# Patient Record
Sex: Female | Born: 1955 | Race: White | Hispanic: No | Smoking: Former smoker
Health system: Southern US, Community
[De-identification: ages and names within clinical notes are randomized; demographics above are authoritative.]

## PROBLEM LIST (undated history)

## (undated) DIAGNOSIS — K5792 Diverticulitis of intestine, part unspecified, without perforation or abscess without bleeding: Secondary | ICD-10-CM

## (undated) DIAGNOSIS — M1990 Unspecified osteoarthritis, unspecified site: Secondary | ICD-10-CM

## (undated) DIAGNOSIS — N879 Dysplasia of cervix uteri, unspecified: Secondary | ICD-10-CM

## (undated) DIAGNOSIS — R42 Dizziness and giddiness: Secondary | ICD-10-CM

## (undated) DIAGNOSIS — T7840XA Allergy, unspecified, initial encounter: Secondary | ICD-10-CM

## (undated) DIAGNOSIS — K635 Polyp of colon: Secondary | ICD-10-CM

## (undated) DIAGNOSIS — F419 Anxiety disorder, unspecified: Secondary | ICD-10-CM

## (undated) DIAGNOSIS — G4730 Sleep apnea, unspecified: Secondary | ICD-10-CM

## (undated) DIAGNOSIS — G43909 Migraine, unspecified, not intractable, without status migrainosus: Secondary | ICD-10-CM

## (undated) DIAGNOSIS — T8859XA Other complications of anesthesia, initial encounter: Secondary | ICD-10-CM

## (undated) DIAGNOSIS — F32A Depression, unspecified: Secondary | ICD-10-CM

## (undated) DIAGNOSIS — C801 Malignant (primary) neoplasm, unspecified: Secondary | ICD-10-CM

## (undated) HISTORY — DX: Sleep apnea, unspecified: G47.30

## (undated) HISTORY — DX: Diverticulitis of intestine, part unspecified, without perforation or abscess without bleeding: K57.92

## (undated) HISTORY — PX: COLPOSCOPY: SHX161

## (undated) HISTORY — PX: BREAST SURGERY: SHX581

## (undated) HISTORY — DX: Polyp of colon: K63.5

## (undated) HISTORY — DX: Unspecified osteoarthritis, unspecified site: M19.90

## (undated) HISTORY — DX: Dizziness and giddiness: R42

## (undated) HISTORY — DX: Dysplasia of cervix uteri, unspecified: N87.9

## (undated) HISTORY — PX: CHOLECYSTECTOMY: SHX55

## (undated) HISTORY — PX: ECTOPIC PREGNANCY SURGERY: SHX613

## (undated) HISTORY — DX: Allergy, unspecified, initial encounter: T78.40XA

## (undated) HISTORY — PX: EXTERNAL EAR SURGERY: SHX627

## (undated) HISTORY — DX: Migraine, unspecified, not intractable, without status migrainosus: G43.909

## (undated) HISTORY — PX: GYNECOLOGIC CRYOSURGERY: SHX857

## (undated) HISTORY — DX: Anxiety disorder, unspecified: F41.9

## (undated) HISTORY — PX: EYE SURGERY: SHX253

---

## 1999-11-19 ENCOUNTER — Emergency Department (HOSPITAL_COMMUNITY): Admission: EM | Admit: 1999-11-19 | Discharge: 1999-11-19 | Payer: Self-pay

## 2000-08-29 ENCOUNTER — Other Ambulatory Visit: Admission: RE | Admit: 2000-08-29 | Discharge: 2000-08-29 | Payer: Self-pay | Admitting: Obstetrics and Gynecology

## 2001-02-25 ENCOUNTER — Ambulatory Visit: Admission: RE | Admit: 2001-02-25 | Discharge: 2001-02-25 | Payer: Self-pay | Admitting: Pulmonary Disease

## 2001-04-10 ENCOUNTER — Ambulatory Visit: Admission: RE | Admit: 2001-04-10 | Discharge: 2001-04-10 | Payer: Self-pay | Admitting: Pulmonary Disease

## 2001-06-04 ENCOUNTER — Ambulatory Visit (HOSPITAL_BASED_OUTPATIENT_CLINIC_OR_DEPARTMENT_OTHER): Admission: RE | Admit: 2001-06-04 | Discharge: 2001-06-04 | Payer: Self-pay | Admitting: Pulmonary Disease

## 2001-09-10 ENCOUNTER — Other Ambulatory Visit: Admission: RE | Admit: 2001-09-10 | Discharge: 2001-09-10 | Payer: Self-pay | Admitting: Obstetrics and Gynecology

## 2002-09-28 ENCOUNTER — Other Ambulatory Visit: Admission: RE | Admit: 2002-09-28 | Discharge: 2002-09-28 | Payer: Self-pay | Admitting: Obstetrics and Gynecology

## 2003-03-01 ENCOUNTER — Encounter: Admission: RE | Admit: 2003-03-01 | Discharge: 2003-03-01 | Payer: Self-pay | Admitting: Family Medicine

## 2003-03-01 ENCOUNTER — Encounter: Payer: Self-pay | Admitting: Family Medicine

## 2003-10-14 ENCOUNTER — Other Ambulatory Visit: Admission: RE | Admit: 2003-10-14 | Discharge: 2003-10-14 | Payer: Self-pay | Admitting: Obstetrics and Gynecology

## 2004-11-02 ENCOUNTER — Other Ambulatory Visit: Admission: RE | Admit: 2004-11-02 | Discharge: 2004-11-02 | Payer: Self-pay | Admitting: Obstetrics and Gynecology

## 2004-11-21 ENCOUNTER — Ambulatory Visit: Payer: Self-pay | Admitting: Internal Medicine

## 2004-12-04 ENCOUNTER — Ambulatory Visit: Payer: Self-pay | Admitting: Internal Medicine

## 2004-12-04 ENCOUNTER — Encounter: Payer: Self-pay | Admitting: Internal Medicine

## 2005-01-01 ENCOUNTER — Ambulatory Visit: Payer: Self-pay | Admitting: Internal Medicine

## 2005-01-18 ENCOUNTER — Ambulatory Visit: Payer: Self-pay | Admitting: Family Medicine

## 2005-02-01 ENCOUNTER — Ambulatory Visit: Payer: Self-pay | Admitting: Internal Medicine

## 2005-02-08 ENCOUNTER — Encounter: Payer: Self-pay | Admitting: Internal Medicine

## 2005-02-26 ENCOUNTER — Ambulatory Visit (HOSPITAL_COMMUNITY): Admission: RE | Admit: 2005-02-26 | Discharge: 2005-02-26 | Payer: Self-pay | Admitting: Neurology

## 2005-11-11 ENCOUNTER — Other Ambulatory Visit: Admission: RE | Admit: 2005-11-11 | Discharge: 2005-11-11 | Payer: Self-pay | Admitting: Obstetrics and Gynecology

## 2006-09-12 ENCOUNTER — Ambulatory Visit: Payer: Self-pay | Admitting: Internal Medicine

## 2006-11-12 ENCOUNTER — Other Ambulatory Visit: Admission: RE | Admit: 2006-11-12 | Discharge: 2006-11-12 | Payer: Self-pay | Admitting: Obstetrics and Gynecology

## 2007-01-13 ENCOUNTER — Ambulatory Visit: Payer: Self-pay | Admitting: Internal Medicine

## 2007-01-30 ENCOUNTER — Ambulatory Visit: Payer: Self-pay | Admitting: Internal Medicine

## 2007-04-10 ENCOUNTER — Ambulatory Visit: Payer: Self-pay | Admitting: Family Medicine

## 2007-05-08 DIAGNOSIS — G43909 Migraine, unspecified, not intractable, without status migrainosus: Secondary | ICD-10-CM | POA: Insufficient documentation

## 2007-06-03 ENCOUNTER — Ambulatory Visit: Payer: Self-pay | Admitting: Internal Medicine

## 2007-06-03 DIAGNOSIS — R002 Palpitations: Secondary | ICD-10-CM | POA: Insufficient documentation

## 2007-06-03 DIAGNOSIS — G4700 Insomnia, unspecified: Secondary | ICD-10-CM | POA: Insufficient documentation

## 2007-06-17 ENCOUNTER — Encounter: Payer: Self-pay | Admitting: Internal Medicine

## 2007-06-17 ENCOUNTER — Ambulatory Visit: Payer: Self-pay

## 2007-06-22 ENCOUNTER — Encounter (INDEPENDENT_AMBULATORY_CARE_PROVIDER_SITE_OTHER): Payer: Self-pay | Admitting: *Deleted

## 2007-06-23 ENCOUNTER — Telehealth (INDEPENDENT_AMBULATORY_CARE_PROVIDER_SITE_OTHER): Payer: Self-pay | Admitting: *Deleted

## 2007-06-29 ENCOUNTER — Ambulatory Visit: Payer: Self-pay | Admitting: Internal Medicine

## 2007-07-24 ENCOUNTER — Ambulatory Visit: Payer: Self-pay | Admitting: Cardiology

## 2007-08-04 ENCOUNTER — Ambulatory Visit: Payer: Self-pay | Admitting: Family Medicine

## 2007-08-31 ENCOUNTER — Ambulatory Visit: Payer: Self-pay | Admitting: Cardiology

## 2007-09-14 ENCOUNTER — Telehealth (INDEPENDENT_AMBULATORY_CARE_PROVIDER_SITE_OTHER): Payer: Self-pay | Admitting: *Deleted

## 2007-09-15 ENCOUNTER — Ambulatory Visit: Payer: Self-pay | Admitting: Internal Medicine

## 2007-09-23 ENCOUNTER — Ambulatory Visit: Payer: Self-pay | Admitting: Internal Medicine

## 2007-11-18 ENCOUNTER — Other Ambulatory Visit: Admission: RE | Admit: 2007-11-18 | Discharge: 2007-11-18 | Payer: Self-pay | Admitting: Obstetrics and Gynecology

## 2008-05-13 ENCOUNTER — Ambulatory Visit: Payer: Self-pay | Admitting: Internal Medicine

## 2008-05-23 ENCOUNTER — Telehealth (INDEPENDENT_AMBULATORY_CARE_PROVIDER_SITE_OTHER): Payer: Self-pay | Admitting: *Deleted

## 2008-05-31 ENCOUNTER — Encounter: Payer: Self-pay | Admitting: Internal Medicine

## 2008-05-31 ENCOUNTER — Telehealth (INDEPENDENT_AMBULATORY_CARE_PROVIDER_SITE_OTHER): Payer: Self-pay | Admitting: *Deleted

## 2008-06-23 ENCOUNTER — Ambulatory Visit: Payer: Self-pay | Admitting: Internal Medicine

## 2008-09-21 ENCOUNTER — Telehealth (INDEPENDENT_AMBULATORY_CARE_PROVIDER_SITE_OTHER): Payer: Self-pay | Admitting: *Deleted

## 2008-09-22 ENCOUNTER — Ambulatory Visit: Payer: Self-pay | Admitting: Internal Medicine

## 2008-10-06 ENCOUNTER — Ambulatory Visit: Payer: Self-pay | Admitting: Internal Medicine

## 2008-11-22 ENCOUNTER — Ambulatory Visit: Payer: Self-pay | Admitting: Obstetrics and Gynecology

## 2008-11-22 ENCOUNTER — Encounter: Payer: Self-pay | Admitting: Obstetrics and Gynecology

## 2008-11-22 ENCOUNTER — Other Ambulatory Visit: Admission: RE | Admit: 2008-11-22 | Discharge: 2008-11-22 | Payer: Self-pay | Admitting: Obstetrics and Gynecology

## 2008-11-29 ENCOUNTER — Encounter: Admission: RE | Admit: 2008-11-29 | Discharge: 2008-11-29 | Payer: Self-pay | Admitting: Obstetrics and Gynecology

## 2008-11-29 ENCOUNTER — Encounter (INDEPENDENT_AMBULATORY_CARE_PROVIDER_SITE_OTHER): Payer: Self-pay | Admitting: *Deleted

## 2009-01-09 ENCOUNTER — Encounter: Payer: Self-pay | Admitting: Internal Medicine

## 2009-04-12 ENCOUNTER — Ambulatory Visit: Payer: Self-pay | Admitting: Family Medicine

## 2009-04-14 ENCOUNTER — Emergency Department (HOSPITAL_COMMUNITY): Admission: EM | Admit: 2009-04-14 | Discharge: 2009-04-14 | Payer: Self-pay | Admitting: Emergency Medicine

## 2009-04-14 ENCOUNTER — Telehealth (INDEPENDENT_AMBULATORY_CARE_PROVIDER_SITE_OTHER): Payer: Self-pay | Admitting: *Deleted

## 2009-04-14 ENCOUNTER — Ambulatory Visit: Payer: Self-pay | Admitting: Cardiology

## 2009-04-17 ENCOUNTER — Telehealth (INDEPENDENT_AMBULATORY_CARE_PROVIDER_SITE_OTHER): Payer: Self-pay | Admitting: *Deleted

## 2009-04-17 ENCOUNTER — Encounter (INDEPENDENT_AMBULATORY_CARE_PROVIDER_SITE_OTHER): Payer: Self-pay | Admitting: *Deleted

## 2009-10-11 ENCOUNTER — Ambulatory Visit: Payer: Self-pay | Admitting: Internal Medicine

## 2009-11-15 LAB — CONVERTED CEMR LAB: Pap Smear: NORMAL

## 2009-11-29 ENCOUNTER — Other Ambulatory Visit: Admission: RE | Admit: 2009-11-29 | Discharge: 2009-11-29 | Payer: Self-pay | Admitting: Obstetrics and Gynecology

## 2009-11-29 ENCOUNTER — Ambulatory Visit: Payer: Self-pay | Admitting: Obstetrics and Gynecology

## 2010-01-08 ENCOUNTER — Ambulatory Visit: Payer: Self-pay | Admitting: Internal Medicine

## 2010-01-16 LAB — HM MAMMOGRAPHY

## 2010-04-02 ENCOUNTER — Ambulatory Visit: Payer: Self-pay | Admitting: Family Medicine

## 2010-06-13 ENCOUNTER — Ambulatory Visit: Payer: Self-pay | Admitting: Internal Medicine

## 2010-06-14 ENCOUNTER — Ambulatory Visit: Payer: Self-pay | Admitting: Internal Medicine

## 2010-06-15 ENCOUNTER — Encounter (INDEPENDENT_AMBULATORY_CARE_PROVIDER_SITE_OTHER): Payer: Self-pay | Admitting: *Deleted

## 2010-06-19 LAB — CONVERTED CEMR LAB
ALT: 16 units/L (ref 0–35)
AST: 18 units/L (ref 0–37)
BUN: 15 mg/dL (ref 6–23)
Basophils Absolute: 0.1 10*3/uL (ref 0.0–0.1)
Basophils Relative: 0.8 % (ref 0.0–3.0)
CO2: 24 meq/L (ref 19–32)
Calcium: 9.2 mg/dL (ref 8.4–10.5)
Chloride: 106 meq/L (ref 96–112)
Cholesterol: 223 mg/dL — ABNORMAL HIGH (ref 0–200)
Creatinine, Ser: 0.9 mg/dL (ref 0.4–1.2)
Direct LDL: 146.5 mg/dL
Eosinophils Absolute: 0.1 10*3/uL (ref 0.0–0.7)
Eosinophils Relative: 1.9 % (ref 0.0–5.0)
GFR calc non Af Amer: 73.45 mL/min (ref >60)
Glucose, Bld: 87 mg/dL (ref 70–99)
HCT: 39 % (ref 36.0–46.0)
HDL: 50.6 mg/dL (ref >39.00)
Hemoglobin: 13.4 g/dL (ref 12.0–15.0)
Lymphocytes Relative: 36.5 % (ref 12.0–46.0)
Lymphs Abs: 2.3 10*3/uL (ref 0.7–4.0)
MCHC: 34.4 g/dL (ref 30.0–36.0)
MCV: 93 fL (ref 78.0–100.0)
Monocytes Absolute: 0.5 10*3/uL (ref 0.1–1.0)
Monocytes Relative: 7.4 % (ref 3.0–12.0)
Neutro Abs: 3.4 10*3/uL (ref 1.4–7.7)
Neutrophils Relative %: 53.4 % (ref 43.0–77.0)
Platelets: 271 10*3/uL (ref 150.0–400.0)
Potassium: 4.5 meq/L (ref 3.5–5.1)
RBC: 4.19 M/uL (ref 3.87–5.11)
RDW: 13.6 % (ref 11.5–14.6)
Sodium: 137 meq/L (ref 135–145)
TSH: 4.44 microintl units/mL (ref 0.35–5.50)
Total CHOL/HDL Ratio: 4
Triglycerides: 114 mg/dL (ref 0.0–149.0)
VLDL: 22.8 mg/dL (ref 0.0–40.0)
WBC: 6.3 10*3/uL (ref 4.5–10.5)

## 2010-06-22 ENCOUNTER — Encounter (INDEPENDENT_AMBULATORY_CARE_PROVIDER_SITE_OTHER): Payer: Self-pay | Admitting: *Deleted

## 2010-06-26 ENCOUNTER — Ambulatory Visit: Payer: Self-pay | Admitting: Internal Medicine

## 2010-07-03 ENCOUNTER — Ambulatory Visit: Payer: Self-pay | Admitting: Internal Medicine

## 2010-07-04 ENCOUNTER — Encounter: Payer: Self-pay | Admitting: Internal Medicine

## 2010-07-05 ENCOUNTER — Telehealth: Payer: Self-pay | Admitting: Internal Medicine

## 2010-07-05 ENCOUNTER — Ambulatory Visit: Payer: Self-pay | Admitting: Internal Medicine

## 2010-07-05 DIAGNOSIS — F329 Major depressive disorder, single episode, unspecified: Secondary | ICD-10-CM | POA: Insufficient documentation

## 2010-07-05 DIAGNOSIS — Z860100 Personal history of colon polyps, unspecified: Secondary | ICD-10-CM | POA: Insufficient documentation

## 2010-07-05 DIAGNOSIS — F3289 Other specified depressive episodes: Secondary | ICD-10-CM | POA: Insufficient documentation

## 2010-07-05 DIAGNOSIS — K219 Gastro-esophageal reflux disease without esophagitis: Secondary | ICD-10-CM | POA: Insufficient documentation

## 2010-07-05 LAB — CONVERTED CEMR LAB
Basophils Absolute: 0 10*3/uL (ref 0.0–0.1)
Eosinophils Absolute: 0.2 10*3/uL (ref 0.0–0.7)
Hemoglobin: 13.6 g/dL (ref 12.0–15.0)
Lymphocytes Relative: 27.9 % (ref 12.0–46.0)
MCHC: 34.6 g/dL (ref 30.0–36.0)
Monocytes Relative: 6.4 % (ref 3.0–12.0)
Neutrophils Relative %: 63.6 % (ref 43.0–77.0)
Platelets: 207 10*3/uL (ref 150.0–400.0)
RDW: 12.8 % (ref 11.5–14.6)

## 2010-11-19 ENCOUNTER — Ambulatory Visit: Payer: Self-pay | Admitting: Internal Medicine

## 2010-12-19 ENCOUNTER — Ambulatory Visit
Admission: RE | Admit: 2010-12-19 | Discharge: 2010-12-19 | Payer: Self-pay | Source: Home / Self Care | Attending: Internal Medicine | Admitting: Internal Medicine

## 2011-01-13 LAB — CONVERTED CEMR LAB
Blood Glucose, Fasting: 0 mg/dL
Hemoglobin: 13.7 g/dL

## 2011-01-15 NOTE — Procedures (Signed)
Summary: Colonoscopy  Patient: Debra Norton Note: All result statuses are Final unless otherwise noted.  Tests: (1) Colonoscopy (COL)   COL Colonoscopy           DONE     [facility]     [address]     [place], [place]  [address]           COLONOSCOPY PROCEDURE REPORT           PATIENT:  Debra Norton, Debra Norton  MR#:  [id number]     BIRTHDATE:  17-Nov-1956, 53 yrs. old  GENDER:  female     ENDOSCOPIST:  Hedwig Morton. Juanda Chance, MD     REF. BY:  Willow Ora, M.D.     PROCEDURE DATE:  07/03/2010     PROCEDURE:  Colonoscopy 19147     ASA CLASS:  Class I     INDICATIONS:  Elevated Risk Screening mother with colon cancer     adenom, polyp 2005,     first colon 1999     MEDICATIONS:   Versed 10 mg, Fentanyl 100 mcg           DESCRIPTION OF PROCEDURE:   After the risks benefits and     alternatives of the procedure were thoroughly explained, informed     consent was obtained.  Digital rectal exam was performed and     revealed no rectal masses.   The LB [id number]-[id number] endoscope     was introduced through the anus and advanced to the cecum, which     was identified by both the appendix and ileocecal valve, without     limitations.  The quality of the prep was good, using MiraLax.     The instrument was then slowly withdrawn as the colon was fully     examined.     <<PROCEDUREIMAGES>>           FINDINGS:  Two polyps were found in the sigmoid to descending     colon segments. 5 mm and 3 mm sessile polyp at 50 cm and 20 cm The     polyp was removed using cold biopsy forceps. Polyp was snared     without cautery. Retrieval was successful (see image7, image8,     image9, and image10). snare polyp  Mild diverticulosis was found     in the sigmoid colon (see image2 and image1).  This was otherwise     a normal examination of the colon (see image3, image4, and     image5).   Retroflexion was not performed.  The scope was then     withdrawn from the patient and the procedure  completed.           COMPLICATIONS:  None     ENDOSCOPIC IMPRESSION:     1) Two polyps in the sigmoid to descending colon segments     2) Mild diverticulosis in the sigmoid colon     3) Otherwise normal examination     RECOMMENDATIONS:     1) Await pathology results     2) High fiber diet.     REPEAT EXAM:  No           ______________________________     Hedwig Morton. Juanda Chance, MD           CC:           n.     eSIGNED:   Hedwig Morton. Danniela Mcbrearty at 07/03/2010 12:14 PM  Iliyah, Bui, [id number]  Note: An exclamation mark (!) indicates a result that was not dispersed into the flowsheet. Document Creation Date: 07/03/2010 12:16 PM _______________________________________________________________________  (1) Order result status: Final Collection or observation date-time: 07/03/2010 12:06 Requested date-time:  Receipt date-time:  Reported date-time:  Referring Physician:   Ordering Physician: Lina Sar [phone]) Specimen Source:  Source: Launa Grill Order Number: [id number] Lab site:   Appended Document: Colonoscopy     Procedures Next Due Date:    Colonoscopy: 07/2013

## 2011-01-15 NOTE — Assessment & Plan Note (Signed)
Summary: COLD?KDC   Vital Signs:  Patient profile:   55 year old female Height:      62 inches Weight:      155.6 pounds Temp:     97.8 degrees F BP sitting:   110 / 70  Vitals Entered By: Shary Decamp (January 08, 2010 3:29 PM) CC: acute only, sinus pain, pressure, congestion x 1 week   History of Present Illness: as above ST-- better cough x 2 days symptoms "moving to the chest" has not taken any meds   Current Medications (verified): 1)  Depo-Subq Provera 104   Susp (Medroxyprogesterone Acetate Susp) .... Every 3 Months  Allergies (verified): 1)  ! Pcn 2)  ! * Tetanus 3)  ! Benadryl  Past History:  Past Medical History: Reviewed history from 05/13/2008 and no changes required. G4 P0 miscarriage x 3  ectopic x 1  Past Surgical History: ectopic pregnancy  Social History: Reviewed history from 10/11/2009 and no changes required. Widow  no children lives by self tobacco--no ETOH-- socially   Review of Systems       (-) fever not sleeping well  (-) N-V (-) myalgias    Impression & Recommendations:  Problem # 1:  URI (ICD-465.9) see instructions  if no better , Abx ?  Problem # 2:  ROUTINE GENERAL MEDICAL EXAM@HEALTH  CARE FACL (ICD-V70.0) due for a CPX, patient aware declined flu shot   Complete Medication List: 1)  Depo-subq Provera 104 Susp (Medroxyprogesterone acetate susp) .... Every 3 months  Patient Instructions: 1)  Get plenty of rest, drink lots of clear liquids, and use Tylenol  2)  mucinex DM for cough 3)  astepro 2 sprays on each side of the nose twice a day  4)  call if no better in few days or if you are  feeling worse.

## 2011-01-15 NOTE — Letter (Signed)
Summary: [facility] Instructions  [facility]  [address] [place], [place] [address]   Phone: [phone]  Fax: [phone]       Debra Norton    08-05-1956    MRN: [id number]       Procedure Day /Date: Tuesday 07-03-10     Arrival Time:  10:00 am     Procedure Time: 11:00 am     Location of Procedure:                    _x_  [facility] (4th Floor)   PREPARATION FOR COLONOSCOPY WITH MIRALAX  Starting 5 days prior to your procedure 06-28-10 do not eat nuts, seeds, popcorn, corn, beans, peas,  salads, or any raw vegetables.  Do not take any fiber supplements (e.g. Metamucil, Citrucel, and Benefiber). ____________________________________________________________________________________________________   THE DAY BEFORE YOUR PROCEDURE         DATE: 07-02-10  DAY:  Monday  1   Drink clear liquids the entire day-NO SOLID FOOD  2   Do not drink anything colored red or purple.  Avoid juices with pulp.  No orange juice.  3   Drink at least 64 oz. (8 glasses) of fluid/clear liquids during the day to prevent dehydration and help the prep work efficiently.  CLEAR LIQUIDS INCLUDE: Water Jello Ice Popsicles Tea (sugar ok, no milk/cream) Powdered fruit flavored drinks Coffee (sugar ok, no milk/cream) Gatorade Juice: apple, white grape, white cranberry  Lemonade Clear bullion, consomm, broth Carbonated beverages (any kind) Strained chicken noodle soup Hard Candy  4   Mix the entire bottle of Miralax with 64 oz. of Gatorade/Powerade in the morning and put in the refrigerator to chill.  5   At 3:00 pm take 2 Dulcolax/Bisacodyl tablets.  6   At 4:30 pm take one Reglan/Metoclopramide tablet.  7  Starting at 5:00 pm drink one 8 oz glass of the Miralax mixture every 15-20 minutes until you have finished drinking the entire 64 oz.  You should finish drinking prep around 7:30 or 8:00 pm.  8   If you are nauseated, you may take the 2nd  Reglan/Metoclopramide tablet at 6:30 pm.        9    At 8:00 pm take 2 more DULCOLAX/Bisacodyl tablets.     THE DAY OF YOUR PROCEDURE      DATE:   07-03-10  DAY: Tuesday  You may drink clear liquids until  9:00 a.m. (2 HOURS BEFORE PROCEDURE).   MEDICATION INSTRUCTIONS  Unless otherwise instructed, you should take regular prescription medications with a small sip of water as early as possible the morning of your procedure.        OTHER INSTRUCTIONS  You will need a responsible adult at least 54 years of age to accompany you and drive you home.   This person must remain in the waiting room during your procedure.  Wear loose fitting clothing that is easily removed.  Leave jewelry and other valuables at home.  However, you may wish to bring a book to read or an iPod/MP3 player to listen to music as you wait for your procedure to start.  Remove all body piercing jewelry and leave at home.  Total time from sign-in until discharge is approximately 2-3 hours.  You should go home directly after your procedure and rest.  You can resume normal activities the day after your procedure.  The day of your procedure you should not:   Drive  Make legal decisions   Operate machinery   Drink alcohol   Return to work  You will receive specific instructions about eating, activities and medications before you leave.   The above instructions have been reviewed and explained to me by   Debra Almas RN  July 54, 2011 8:20 AM     I fully understand and can verbalize these instructions _____________________________ Date _______

## 2011-01-15 NOTE — Miscellaneous (Signed)
Summary: [facility] Previsit/prep  Clinical Lists Changes  Medications: Added new medication of DULCOLAX 5 MG  TBEC (BISACODYL) Day before procedure take 2 at 3pm and 2 at 8pm. - Signed Added new medication of METOCLOPRAMIDE HCL 10 MG  TABS (METOCLOPRAMIDE HCL) As per prep instructions. - Signed Added new medication of MIRALAX   POWD (POLYETHYLENE GLYCOL 3350) As per prep  instructions. - Signed Rx of DULCOLAX 5 MG  TBEC (BISACODYL) Day before procedure take 2 at 3pm and 2 at 8pm.;  #4 x 0;  Signed;  Entered by: [name] RN;  Authorized by: Hart Carwin MD;  Method used: Electronically to [facility] [phone]*, [address] [place], [place]  [address], Ph: [phone], Fax: [phone] Rx of METOCLOPRAMIDE HCL 10 MG  TABS (METOCLOPRAMIDE HCL) As per prep instructions.;  #2 x 0;  Signed;  Entered by: [name] RN;  Authorized by: Hart Carwin MD;  Method used: Electronically to [facility] [phone]*, [address], [place], [place]  [address], Ph: [phone], Fax: [phone] Rx of MIRALAX   POWD (POLYETHYLENE GLYCOL 3350) As per prep  instructions.;  #255gm x 0;  Signed;  Entered by: [name] RN;  Authorized by: Hart Carwin MD;  Method used: Electronically to [facility] [phone]*, [address] [place], [place]  [address], Ph: [phone], Fax: [phone] Allergies: Changed allergy or adverse reaction from BENADRYL to BENADRYL Observations: Added new observation of ALLERGY REV: Done (06/26/2010 7:57)    Prescriptions: MIRALAX   POWD (POLYETHYLENE GLYCOL 3350) As per prep  instructions.  #255gm x 0   Entered by:   [name] RN   Authorized by:   Hart Carwin MD   Signed by:   [name] RN on 06/26/2010   Method used:   Electronically to        [facility] [phone]* (retail)       [address]       [place], [place]  [address]       Ph: [phone]       Fax: [phone]   RxID:   [id number] METOCLOPRAMIDE HCL 10 MG  TABS (METOCLOPRAMIDE HCL) As per  prep instructions.  #2 x 0   Entered by:   [name] RN   Authorized by:   Hart Carwin MD   Signed by:   [name] RN on 06/26/2010   Method used:   Electronically to        [facility] [phone]* (retail)       [address]       [place], [place]  [address]       Ph: [phone]       Fax: [phone]   RxID:   [id number] DULCOLAX 5 MG  TBEC (BISACODYL) Day before procedure take 2 at 3pm and 2 at 8pm.  #4 x 0   Entered by:   [name] RN   Authorized by:   Hart Carwin MD   Signed by:   [name] RN on 06/26/2010   Method used:   Electronically to        [facility] [phone]* (retail)       [address]       [place], [place]  [address]       Ph: [phone]       Fax: [phone]   RxID:   [id number]

## 2011-01-15 NOTE — Assessment & Plan Note (Signed)
Summary: RECTAL BLEEDING POST COLON 07-03-10         [facility]   History of Present Illness Visit Type: Initial Visit Primary GI MD: Lina Sar MD Primary Provider: Willow Ora, MD Chief Complaint: Rectal bleeding today, post colonoscopy. Feels weak.  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 She developed painless rectal bleeding which began increasing in volume this afternoon. She was asked to come in the office for evaluation and for  a STAT blood count.   GI Review of Systems    Reports abdominal pain.     Location of  Abdominal pain: LLQ.    Denies acid reflux, belching, bloating, chest pain, dysphagia with liquids, dysphagia with solids, heartburn, loss of appetite, nausea, vomiting, vomiting blood, weight loss, and  weight gain.      Reports rectal bleeding.     Denies anal fissure, black tarry stools, change in bowel habit, constipation, diarrhea, diverticulosis, fecal incontinence, heme positive stool, hemorrhoids, irritable bowel syndrome, jaundice, light color stool, liver problems, and  rectal pain.    Current Medications (verified): 1)  Depo-Subq Provera 104   Susp (Medroxyprogesterone Acetate Susp) .... Every 3 Months 2)  One Daily  Tabs (Multiple Vitamin) .... Take One By Mouth Once Daily 3)  Probiotic .... Take One By Mouth As Needed  Allergies (verified): 1)  ! Pcn 2)  ! * Tetanus 3)  ! Benadryl  Past History:  Past Medical History: Reviewed history from 06/13/2010 and no changes required. G4 P0 miscarriage x 3  ectopic x 1 cholelithiais , no surgery   Past Surgical History: Reviewed history from 07/05/2010 and no changes required. ectopic pregnancy R ear surgery for hematoma aprox 4-11  Family History: Reviewed history from 07/05/2010 and no changes required. ovarian/uterine Ca - cousin HTN - no CAD - no stroke - no Family History  of Colon Cancer: Mother Family History of Breast Cancer:Paternal Grandmother Family History of Diabetes: Sister  Social History: Widow  no children lives by self tobacco--no ETOH-- socially  diet-- somehow better exercise-- rarely  Daily Caffeine  tea in the morning  Review of Systems       The patient complains of allergy/sinus, cough, fatigue, and sleeping problems.  The patient denies anemia, anxiety-new, arthritis/joint pain, back pain, blood in urine, breast changes/lumps, change in vision, confusion, coughing up blood, depression-new, fainting, fever, headaches-new, hearing problems, heart murmur, heart rhythm changes, itching, menstrual pain, muscle pains/cramps, night sweats, nosebleeds, pregnancy symptoms, shortness of breath, skin rash, sore throat, swelling of feet/legs, swollen lymph glands, thirst - excessive , urination - excessive , urination changes/pain, urine leakage, vision changes, and voice change.         Pertinent positive and negative review of systems were noted in the above HPI. All other ROS was otherwise negative.   Vital Signs:  Patient profile:   55 year old female Height:      62 inches Weight:      148.6 pounds BMI:     27.28 Pulse rate:   92 / minute Pulse rhythm:   regular BP sitting:   112 / 68  (left arm) Cuff size:   regular  Vitals Entered By: Harlow Mares CMA Duncan Dull) (July 05, 2010 5:12 PM)  Physical Exam  General:  Well developed, well nourished, no acute distress. Lungs:  Clear throughout to auscultation. Heart:  Regular rate and  rhythm; no murmurs, rubs,  or bruits. Abdomen:  tenderness in left lower quadrant, no rebound. Rectal:  rectal and anoscopic exam reveals no blood in the rectal ampulla. There was a bleeding anal fissure at 9:00 which was 1 cm long and somewhat tender. Msk:  Symmetrical with no gross deformities. Normal posture. Extremities:  No clubbing, cyanosis, edema or deformities noted. Skin:  Intact without significant  lesions or rashes. Psych:  Alert and cooperative. Normal mood and affect.   Impression & Recommendations:  Problem # 1:  COLONIC POLYPS, ADENOMATOUS, HX OF (ICD-V12.72) Patient is status post colonoscopy 2 days ago with low volume  rectal bleeding which was evaluated today with anoscopic exam and found to be due to a bleeding anal fissure. Her hemoglobin is 13.6 and stable. Her vital signs are stable. I have given her samples of Analpram cream 2.5% to use of 3-4 times a day. She will stay on light foods for the next 24 hours and wil call us as needed. The polyps were serrated adenomas. She will be sent a letter for a recall colonoscopy in 3 years.  Problem # 2:  Family Hx of COLON CANCER (ICD-153.9) A recall colonoscopy will be due in 3 years.  Patient Instructions: 1)  Analpram cream samples given to use of 3 times a day for anal fissure. 2)  Stay on light foods for the next 24 hours. 3)  Call if further bleeding. 4)  Recall colonoscopy will be due in 3 years.

## 2011-01-15 NOTE — Progress Notes (Signed)
Summary: Triage-Rectal bleeding  Phone Note Call from Patient Call back at Home Phone [phone]   Caller: Patient Call For: Dr. Juanda Chance Reason for Call: Talk to Nurse Summary of Call: pt. had COL on 07-03-10 and felt fine. Noticed today rectal bleeding with her BM Initial call taken by: Karna Christmas,  July 05, 2010 3:10 PM  Follow-up for Phone Call        Pt. had Colon/Polypectomy 07-03-10. No blood yesterday. Since noon she has had  3 small BM's with BRB in toilet and on tissue, feels the volume of blood is increasing.  Some abd. cramping.  T-99.1.   Per Dr.Kaylea Mounsey--STAT CBCD and STAT OV. Pt. is on her way. Follow-up by: Laureen Ochs LPN,  July 05, 2010 4:33 PM

## 2011-01-15 NOTE — Assessment & Plan Note (Signed)
Summary: cough/cbs   Vital Signs:  Patient profile:   55 year old female Weight:      155.25 pounds O2 Sat:      97 % on Room air Temp:     98.6 degrees F oral Pulse rate:   82 / minute Pulse rhythm:   regular BP sitting:   128 / 84  (left arm) Cuff size:   regular  Vitals Entered By: Army Fossa CMA (November 19, 2010 4:17 PM)  O2 Flow:  Room air CC: Head congestion, chest congestion  Comments ear pain sinus pressure  x 1 week  [facility] [address]    History of Present Illness: symptoms started one week ago:  sore throat, runny nose, headache, sinus congestion. For the last 24 hours she has developed intense left ear pain delsyum  over-the-counter not helping, unable to sleep due to cough  Review of systems No nausea, vomiting, diarrhea No ear discharge No myalgias  Current Medications (verified): 1)  Depo-Subq Provera 104   Susp (Medroxyprogesterone Acetate Susp) .... Every 3 Months 2)  One Daily  Tabs (Multiple Vitamin) .... Take One By Mouth Once Daily 3)  Probiotic .... Take One By Mouth As Needed  Allergies (verified): 1)  ! Pcn 2)  ! * Tetanus 3)  ! Benadryl  Past History:  Past Medical History: Reviewed history from 06/13/2010 and no changes required. G4 P0 miscarriage x 3  ectopic x 1 cholelithiais , no surgery   Past Surgical History: Reviewed history from 07/05/2010 and no changes required. ectopic pregnancy R ear surgery for hematoma aprox 4-11  Social History: Reviewed history from 07/05/2010 and no changes required. Widow  no children lives by self tobacco--no ETOH-- socially  diet-- somehow better exercise-- rarely  Daily Caffeine  tea in the morning  Physical Exam  General:  alert, well-developed, and well-nourished.   Head:  face symmetric, slightly tender in the left maxillary sinus Ears:  R ear normal.  left TM slightly bulging and red Nose:  slightly congested Mouth:  no redness or discharge Lungs:  normal respiratory  effort, no intercostal retractions, no accessory muscle use, and normal breath sounds.     Impression & Recommendations:  Problem # 1:  LOM (ICD-382.9) one-week history of a URI now with otitis media and possibly sinusitis. See instructions. allergic to PCN   Her updated medication list for this problem includes:    Zithromax Z-pak 250 Mg Tabs (Azithromycin) .Marland Kitchen... As directed  Complete Medication List: 1)  Depo-subq Provera 104 Susp (Medroxyprogesterone acetate susp) .... Every 3 months 2)  One Daily Tabs (Multiple vitamin) .... Take one by mouth once daily 3)  Probiotic  .... Take one by mouth as needed 4)  Zithromax Z-pak 250 Mg Tabs (Azithromycin) .... As directed 5)  Hydrocodone-homatropine 5-1.5 Mg/105ml Syrp (Hydrocodone-homatropine) .Marland Kitchen.. 1 or 2  teaspoon at bedtime as needed for cough  Patient Instructions: 1)  rest, fluids, Tylenol 2)  Mucinex DM twice a day as needed for cough 3)  codeine as needed if severe cough at night 4)  Zithromax 5)  call if not better in a few days Prescriptions: ZITHROMAX Z-PAK 250 MG TABS (AZITHROMYCIN) as directed  #1 x 0   Entered and Authorized by:   Nolon Rod. Paz MD   Signed by:   Nolon Rod. Paz MD on 11/19/2010   Method used:   Print then Give to Patient   RxID:   [id number] HYDROCODONE-HOMATROPINE 5-1.5 MG/5ML SYRP (HYDROCODONE-HOMATROPINE) 1  or 2  teaspoon at bedtime as needed for cough  #120cc x 0   Entered and Authorized by:   Nolon Rod. Paz MD   Signed by:   Nolon Rod. Paz MD on 11/19/2010   Method used:   Print then Give to Patient   RxID:   [id number]    Orders Added: 1)  Est. Patient Level III [[address]782]
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
## 2011-01-15 NOTE — Assessment & Plan Note (Signed)
Summary: CPX//kn   Vital Signs:  Patient profile:   55 year old female Height:      62 inches Weight:      152.25 pounds BMI:     27.95 Pulse rate:   78 / minute Pulse rhythm:   regular BP sitting:   102 / 64  (left arm) Cuff size:   regular  Vitals Entered By: Army Fossa CMA (June 13, 2010 2:54 PM) CC: CPX: Not fasting Is Patient Diabetic? No   History of Present Illness: complete physical exam  has several concerns  knot in the L  flanks x months, "sometimes i feel one in the R" likes thyroid recheck  not sleeping well, trial w/ ativan? loosing hair?    Allergies: 1)  ! Pcn 2)  ! * Tetanus 3)  ! Benadryl  Past History:  Past Medical History: G4 P0 miscarriage x 3  ectopic x 1 cholelithiais , no surgery   Past Surgical History: ectopic pregnancy R ear surgery for hematoma aprox 4-11  Family History: colon Ca - M breast Ca - PGM ovarian/uterine Ca - cousin DM - sister HTN - no CAD - no stroke - no  Social History: Widow  no children lives by self tobacco--no ETOH-- socially  diet-- somehow better exercise-- rarely   Review of Systems General:  Denies fever and weight loss. ENT:  occasionally sneezing , one episode was associated w/ SOB no itchy eyes or nose . CV:  Denies chest pain or discomfort and swelling of feet. Resp:  Denies sputum productive and wheezing; mild cough sometimes . GI:  Denies bloody stools, diarrhea, nausea, and vomiting. GU:  saw gyn 12-10. Psych:  Denies anxiety and depression.  Physical Exam  General:  alert, well-developed, and overweight-appearing.   Neck:  no masses and no thyromegaly.   Lungs:  normal respiratory effort, no intercostal retractions, no accessory muscle use, and normal breath sounds.   Heart:  normal rate, regular rhythm, no murmur, and no gallop.   Abdomen:  soft, normal bowel sounds, no distention, no masses, no guarding, and no rigidity.   Extremities:  no pretibial edema bilaterally    Skin:  at the left lateral chest, she has a 1 cm, soft, mobile, S.Q. mass   Psych:  Oriented X3, memory intact for recent and remote, normally interactive, good eye contact, not anxious appearing, and not depressed appearing.     Impression & Recommendations:  Problem # 1:  ROUTINE GENERAL MEDICAL EXAM@HEALTH  CARE FACL (ICD-V70.0) TD--"allergic to it"  sees gyn  Mammogram History:  Date of Last Mammogram:  01/16/2010   Results:  normal  PAP Smear History:    Date of Last PAP Smear:  11/15/2009   Results:  normal   Cscope 12-05, tubular adenomas , due for one ----> Will contact GI  discussed with patient diet and  exercise left-sided chest subcutaneous nodule likely a lipoma, it has been unchanged for a while the rib recommend call me if something is different  Orders: Gastroenterology Referral (GI)  Problem # 2:  INSOMNIA (ICD-780.52) tried ambien before--caused her to be groggy xanax used to help but is not helping anymore--d/c it used  lunesta 3mg  bedore,  she liked it uses Ativan before and likes  it as well Would like to try Ativan. Prescription issued    Complete Medication List: 1)  Depo-subq Provera 104 Susp (Medroxyprogesterone acetate susp) .... Every 3 months 2)  Lorazepam 1 Mg Tabs (Lorazepam) .... Half or one tablet  at bedtime as needed for insomnia  Patient Instructions: 1)  come back fasting 2)  FLP, AST, ALT, BMP, CBC, TSH----dx V70 3)  Please schedule a follow-up appointment in 1 year.  Prescriptions: LORAZEPAM 1 MG TABS (LORAZEPAM) half or one tablet at bedtime as needed for insomnia  #30 x 2   Entered and Authorized by:   Nolon Rod. Paz MD   Signed by:   Nolon Rod. Paz MD on 06/13/2010   Method used:   Print then Give to Patient   RxID:   [id number]    Preventive Care Screening  Mammogram:    Date:  01/16/2010    Results:  normal   Pap Smear:    Date:  11/15/2009    Results:  normal    Risk Factors:  Mammogram History:     Date of Last  Mammogram:  01/16/2010    Results:  normal   PAP Smear History:     Date of Last PAP Smear:  11/15/2009    Results:  normal     Tetanus/Td Immunization History:    Tetanus/Td # 1:  "allergic" per pt  (06/13/2010)

## 2011-01-15 NOTE — Letter (Signed)
Summary: Patient Notice- Polyp Results  [facility]  [address] [name], [place] [address]   Phone: [phone]  Fax: [phone]        July 04, 2010 MRN: [id number]    Debra Norton [address] [place], [place]  [address]    Dear 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  Should you develop new or worsening symptoms of abdominal pain, bowel habit changes or bleeding from the rectum or bowels, please schedule an evaluation with either your primary care physician or with me.  Additional information/recommendations:  _x_ No further action with gastroenterology is needed at this time. Please      follow-up with your primary care physician for your other healthcare      needs.  __ Please call [phone] to schedule a return visit to review your      situation.  __ Please keep your follow-up visit as already scheduled.  __ Continue treatment plan as outlined the day of your exam.  Please call us if you are having persistent problems or have questions about your condition that have not been fully answered at this time.  Sincerely,  Hart Carwin MD  This letter has been electronically signed by your physician.  Appended Document: Patient Notice- Polyp Results letter mailed

## 2011-01-15 NOTE — Letter (Signed)
Summary: Previsit letter  [facility]  [address] [place], [place] [address]   Phone: [phone]  Fax: [phone]       06/15/2010 MRN: [id number]  Debra Norton [address] [place], [place]  [address]  Dear Debra Norton,  Welcome to the Gastroenterology Division at [facility].    You are scheduled to see a nurse for your pre-procedure visit on 06-26-10 at 8:00a.m. on the 3rd floor at [facility], [address]  We ask that you try to arrive at our office 15 minutes prior to your appointment time to allow for check-in.  Your nurse visit will consist of discussing your medical and surgical history, your immediate family medical history, and your medications.    Please bring a complete list of all your medications or, if you prefer, bring the medication bottles and we will list them.  We will need to be aware of both prescribed and over the counter drugs.  We will need to know exact dosage information as well.  If you are on blood thinners (Coumadin, Plavix, Aggrenox, Ticlid, etc.) please call our office today/prior to your appointment, as we need to consult with your physician about holding your medication.   Please be prepared to read and sign documents such as consent forms, a financial agreement, and acknowledgement forms.  If necessary, and with your consent, a friend or relative is welcome to sit-in on the nurse visit with you.  Please bring your insurance card so that we may make a copy of it.  If your insurance requires a referral to see a specialist, please bring your referral form from your primary care physician.  No co-pay is required for this nurse visit.     If you cannot keep your appointment, please call [phone] to cancel or reschedule prior to your appointment date.  This allows [place] the opportunity to schedule an appointment for another patient in need of care.    Thank you for choosing [facility] for your  medical needs.  We appreciate the opportunity to care for you.  Please visit [place] at our website  to learn more about our practice.                     Sincerely.                                                                                                                   The Gastroenterology Division

## 2011-01-17 NOTE — Assessment & Plan Note (Signed)
Summary: ELBOW DISCOMFORT FOR A FEW WEEKS AND HAND/ARM NUMBNESS/KB   Vital Signs:  Patient profile:   55 year old female Weight:      151.38 pounds Pulse rate:   80 / minute Pulse rhythm:   regular BP sitting:   116 / 72  (left arm) Cuff size:   regular  Vitals Entered By: Army Fossa CMA (December 19, 2010 3:42 PM) CC: Pt here to discuss (L) arm Comments -painful -hand goes numb -hurts to pick anything up [address]    History of Present Illness: patient is L handed  several  weeks  history of pain in the left elbow. Pain is located at the radial, posterior aspect of the elbow. Worse with certain movements or if she tries to grab something Also numbness on and off in the left hand third fourth and fifth digits.   ROS No edema on the elbow admits to her repetitive use of her left arm although she  has not been working x the last few weeks  No heavy lifting  Current Medications (verified): 1)  Depo-Subq Provera 104   Susp (Medroxyprogesterone Acetate Susp) .... Every 3 Months 2)  One Daily  Tabs (Multiple Vitamin) .... Take One By Mouth Once Daily 3)  Probiotic .... Take One By Mouth As Needed  Allergies (verified): 1)  ! Pcn 2)  ! * Tetanus 3)  ! Benadryl  Past History:  Past Medical History: Reviewed history from 06/13/2010 and no changes required. G4 P0 miscarriage x 3  ectopic x 1 cholelithiais , no surgery   Past Surgical History: Reviewed history from 07/05/2010 and no changes required. ectopic pregnancy R ear surgery for hematoma aprox 4-11  Physical Exam  General:  alert and well-developed.   Extremities:  right elbow normal Left elbow: tender slightly proximal to the lateral epicondyle. No warm-flucuant. elbow is otherwise normal with good range of motion.    Impression & Recommendations:  Problem # 1:  ELBOW PAIN (ICD-719.42) pain  close to the lateral epicondyle She has some numbness in the fingers consistent with a ulnar  neuropathy however the pain is not located at the trajectory of the ulnar nerve. Plan: Steroids rest ice  orthopedic  referral if not better  Complete Medication List: 1)  Depo-subq Provera 104 Susp (Medroxyprogesterone acetate susp) .... Every 3 months 2)  One Daily Tabs (Multiple vitamin) .... Take one by mouth once daily 3)  Probiotic  .... Take one by mouth as needed 4)  Prednisone 10 Mg Tabs (Prednisone) .... 4 by mouth once daily x 2, 3x2,2x2,1x2  Patient Instructions: 1)  Steroids 2)  rest 3)  ice  4)  Tylenol 500 mg 2 tablets every 6 hours as needed 5)  Call if not better in 2 weeks Prescriptions: PREDNISONE 10 MG TABS (PREDNISONE) 4 by mouth once daily x 2, 3x2,2x2,1x2  #20 x 0   Entered and Authorized by:   Nolon Rod. Lyndzie Zentz MD   Signed by:   Nolon Rod. Tyse Auriemma MD on 12/19/2010   Method used:   Electronically to        [facility] [phone]* (retail)       [address]       [place], [place]  [address]       Ph: [phone]       Fax: [phone]   RxID:   [id number]    Orders Added: 1)  Est. Patient Level III [[address]]

## 2011-04-25 ENCOUNTER — Other Ambulatory Visit: Payer: Self-pay | Admitting: Obstetrics and Gynecology

## 2011-04-25 ENCOUNTER — Encounter (INDEPENDENT_AMBULATORY_CARE_PROVIDER_SITE_OTHER): Admitting: Obstetrics and Gynecology

## 2011-04-25 ENCOUNTER — Other Ambulatory Visit (HOSPITAL_COMMUNITY)
Admission: RE | Admit: 2011-04-25 | Discharge: 2011-04-25 | Disposition: A | Discharge status: Home / Self Care | Source: Ambulatory Visit | Attending: Obstetrics and Gynecology | Admitting: Obstetrics and Gynecology

## 2011-04-25 DIAGNOSIS — Z124 Encounter for screening for malignant neoplasm of cervix: Secondary | ICD-10-CM | POA: Insufficient documentation

## 2011-04-25 DIAGNOSIS — N912 Amenorrhea, unspecified: Secondary | ICD-10-CM

## 2011-04-25 DIAGNOSIS — Z01419 Encounter for gynecological examination (general) (routine) without abnormal findings: Secondary | ICD-10-CM

## 2011-04-30 NOTE — Assessment & Plan Note (Signed)
[facility]                            CARDIOLOGY OFFICE NOTE   NAME:FORTE-LEROUXMaricarmen, Braziel                MRN:          [id number]  DATE:07/24/2007                            DOB:          [birth year]    [name] comes today to the office per Dr. Leta Jungling request for a  consultation for episodes of palpitations, dizziness, and diaphoresis or  sweatiness.   HISTORY OF PRESENT ILLNESS:  She is 55 years of age, widowed (lost her  husband to cancer a year and a half ago), white female who has been  having the above complaints over the last few weeks.  When she is doing  yoga she feels weak and sweaty.  When she is mowing her yard or doing  other work, she feels weak, sweaty, and lightheaded.  She has some  palpitations that usually are spontaneous and always associated with  these spells.  She has not fainted.  She denies any exertional angina or ischemic symptoms otherwise.  She  has no history of cardiac disease.  Her cardiac risk factors are really  negative.  She did have a smoking history but quit in 1977.   PAST MEDICAL HISTORY:  1. She is intolerant of tetanus and penicillin.  2. She occasionally smokes a joint.  She does not do any cocaine.  3. She has 2-3 caffeinated beverages a day.  4. She says she does not like exercise and really never has.  She      thinks some of this may just be deconditioning.   SURGICAL HISTORY:  Ectopic pregnancy in 1990.  She has no children.   CURRENT MEDICATIONS:  1. Xanax 0.5 mg at night.  2. Depo-Provera every 3 months.  3. B complex 1 a day.   FAMILY HISTORY:  Negative for premature heart disease.   SOCIAL HISTORY:  She is widowed unfortunately.  She has no children.  She is an Corporate treasurer.   REVIEW OF SYSTEMS:  Positive for fatigue, gastroenterology reflux, and  depression.  Her other review of systems is negative.   PHYSICAL EXAMINATION:  VITAL SIGNS:  Her blood  pressure is 100/68, pulse  74 and regular.  She is 5 feet 2 inches, weighs 152 pounds.  HEENT:  Normocephalic atraumatic.  PERRLA.  Extraocular movements  intact.  Sclerae are clear.  Facial symmetry is normal.  Carotid  upstrokes are equal bilaterally without bruits.  There is no  thyromegaly.  Trachea is midline.  LUNGS:  Clear.  HEART:  Reveals a nondisplaced PMI.  She has normal S1 and a  physiologically split S2.  No murmurs are appreciated.  There is no  click.  ABDOMEN:  Soft with good bowel sounds.  No midline bruit.  There is no  hepatomegaly.  EXTREMITIES:  Reveal no cyanosis, clubbing, or edema.  Pulses are  intact.  NEUROLOGIC:  Intact.  SKIN:  Unremarkable.   A 2D echocardiogram in July was normal.  She had also had a 2D echo for  some chest pain in 2002, which was also normal.   Her electrocardiogram is completely normal.  ASSESSMENT:  Complex array of symptoms with and without exertion, hard  to determine etiology.  We need to rule out any arrhythmia with her  palpitations.   PLAN:  A 24-hour Holter monitor.  On that day she has been asked to keep  a diary and to do her yoga and yard work if possible.     Thomas C. Daleen Squibb, MD, [facility]  Electronically Signed    TCW/MedQ  DD: 07/24/2007  DT: 07/24/2007  Job #: [id number]   cc:   Willow Ora, MD

## 2011-05-03 NOTE — Assessment & Plan Note (Signed)
[facility]                                 ON-CALL NOTE   NAME:[name], [name]                MRN:          [id number]  DATE:03/07/2007                            DOB:          1956/08/23    TIME RECEIVED:  9:18 a.m.   TELEPHONE:  [id number].   TELEPHONE TRIAGE NOTE:  The patient states she has a sinus infection.  She is in [place], [place] for the weekend. She has a stuffy head,  post-nasal drainage, and a cough. She denies any fever. She is using  Sudafed with no relief. My response is to try Mucinex as needed and to  drink plenty of fluids. If she feels she needs antibiotics, she should  go to a local urgent care center.     Tera Mater. Clent Ridges, MD  Electronically Signed    SAF/MedQ  DD: 03/07/2007  DT: 03/07/2007  Job #: [id number]   cc:   Willow Ora, MD

## 2011-05-31 ENCOUNTER — Other Ambulatory Visit (INDEPENDENT_AMBULATORY_CARE_PROVIDER_SITE_OTHER)

## 2011-05-31 DIAGNOSIS — R5381 Other malaise: Secondary | ICD-10-CM

## 2011-06-28 ENCOUNTER — Other Ambulatory Visit

## 2011-07-05 ENCOUNTER — Telehealth: Payer: Self-pay | Admitting: Internal Medicine

## 2011-07-05 MED ORDER — HYDROCORTISONE 2.5 % EX CREA: TOPICAL_CREAM | Freq: Two times a day (BID) | CUTANEOUS | Status: DC

## 2011-07-05 NOTE — Telephone Encounter (Signed)
Pt is aware.  

## 2011-07-05 NOTE — Telephone Encounter (Signed)
Pt called says she will be traveling to [facility] for the weekend and her that mosquitoes are really bad there and would like to know what she could do to help w/ bites since hydrocortisone doesn't help much.

## 2011-07-05 NOTE — Telephone Encounter (Signed)
Needs to  use repellent , long sleeves, avoid  going out late in the afternoon when the mosquitos are out ; we can call hydrocortisone 2.5% cream to apply twice a day as needed (it is the stronger than the  over-the-counter)

## 2011-08-28 ENCOUNTER — Ambulatory Visit (INDEPENDENT_AMBULATORY_CARE_PROVIDER_SITE_OTHER): Admitting: Internal Medicine

## 2011-08-28 DIAGNOSIS — J309 Allergic rhinitis, unspecified: Secondary | ICD-10-CM

## 2011-08-28 MED ORDER — MOMETASONE FUROATE 50 MCG/ACT NA SUSP: 2.0000 | Freq: Every day | NASAL | Status: DC

## 2011-08-28 MED ORDER — PREDNISONE 10 MG PO TABS: ORAL_TABLET | ORAL | Status: AC

## 2011-08-28 NOTE — Progress Notes (Signed)
  Subjective:    Patient ID: Debra Norton, female    DOB: [birth year], 55 y.o.   MRN: [id number]  HPI Symptoms is that a few days ago: Sinus congestion, sore throat, ears feel clogged, itchy throat. Similar symptoms have been happening seasonally during the fall.  Past Medical History: Reviewed history from 06/13/2010 and no changes required. G4 P0 miscarriage x 3  ectopic x 1 cholelithiais , no surgery   Past Surgical History: Reviewed history from 07/05/2010 and no changes required. ectopic pregnancy R ear surgery for hematoma aprox 4-11    Review of Systems Denies any fevers Mild chest congestion with dry cough Reports a lot of sneezing and some itchy eyes. Denies any green nasal discharge, if any it is clear    Objective:   Physical Exam  Constitutional: She appears well-developed and well-nourished. No distress.  HENT:  Head: Normocephalic and atraumatic.  Right Ear: External ear normal.  Left Ear: External ear normal.  Mouth/Throat: No oropharyngeal exudate.       Face symmetric and nontender to palpation. Nose is slightly congested, no discharge noted  Neck: Normal range of motion. Neck supple.  Cardiovascular: Normal rate, regular rhythm and normal heart sounds.   No murmur heard. Pulmonary/Chest: Effort normal and breath sounds normal. No respiratory distress. She has no wheezes. She has no rales.  Musculoskeletal: She exhibits no edema.  Skin: She is not diaphoretic.          Assessment & Plan:

## 2011-08-28 NOTE — Patient Instructions (Signed)
Rest, fluids , tylenol For cough-congestion, take Mucinex DM twice a day as needed  Take prednisone x few days, nasonex x few weeks Call if no better in few days Call anytime if the symptoms are severe, you have high fever, green nasal discharge, increased cough

## 2011-08-28 NOTE — Assessment & Plan Note (Signed)
Symptoms are more consistent with allergies than actual sinusitis. We'll treat as allergies, if she's not better, will consider antibiotics. She has a history of intolerance to Benadryl, does not recall trying Claritin or other over-the-counter antihistamine thus will avoid them for now.

## 2011-09-02 ENCOUNTER — Telehealth: Payer: Self-pay | Admitting: Internal Medicine

## 2011-09-02 NOTE — Telephone Encounter (Signed)
Pls advise pt is still not better.

## 2011-09-03 NOTE — Telephone Encounter (Signed)
Please call a Z-Pak, if not improving after that needs to come back to the office

## 2011-09-04 MED ORDER — AZITHROMYCIN 250 MG PO TABS: ORAL_TABLET | ORAL | Status: DC

## 2011-09-04 NOTE — Telephone Encounter (Signed)
Called pt no answer LMOM rx sent to [facility].Marland KitchenMarland Kitchen9/19/12@10 :59am/LMB

## 2011-09-10 ENCOUNTER — Other Ambulatory Visit: Payer: Self-pay | Admitting: Internal Medicine

## 2011-09-16 ENCOUNTER — Other Ambulatory Visit: Payer: Self-pay | Admitting: Internal Medicine

## 2011-09-16 NOTE — Telephone Encounter (Signed)
LMOM to inform patient that she will need to schedule OV before another ABX can be authorized [per phone note from 09/03/11 MD instructions]. Left contact name & number.

## 2011-09-17 NOTE — Telephone Encounter (Signed)
Denied,  if she still has URI/sinusitis symptoms needs to be seen

## 2012-04-27 ENCOUNTER — Encounter: Payer: Self-pay | Admitting: Gynecology

## 2012-04-28 ENCOUNTER — Encounter: Payer: Self-pay | Admitting: Obstetrics and Gynecology

## 2012-04-28 ENCOUNTER — Ambulatory Visit (INDEPENDENT_AMBULATORY_CARE_PROVIDER_SITE_OTHER): Admitting: Obstetrics and Gynecology

## 2012-04-28 VITALS — BP 120/70 | Ht 62.0 in | Wt 156.0 lb

## 2012-04-28 DIAGNOSIS — Z01419 Encounter for gynecological examination (general) (routine) without abnormal findings: Secondary | ICD-10-CM

## 2012-04-28 DIAGNOSIS — N879 Dysplasia of cervix uteri, unspecified: Secondary | ICD-10-CM | POA: Insufficient documentation

## 2012-04-28 DIAGNOSIS — E7800 Pure hypercholesterolemia, unspecified: Secondary | ICD-10-CM

## 2012-04-28 DIAGNOSIS — R635 Abnormal weight gain: Secondary | ICD-10-CM

## 2012-04-28 MED ORDER — MEDROXYPROGESTERONE ACETATE 150 MG/ML IM SUSP: 150.0000 mg | INTRAMUSCULAR | Status: DC

## 2012-04-28 MED ORDER — ALPRAZOLAM 0.25 MG PO TABS: 0.2500 mg | ORAL_TABLET | Freq: Every evening | ORAL | Status: AC | PRN

## 2012-04-28 NOTE — Patient Instructions (Signed)
Return fasted for lab work. 

## 2012-04-28 NOTE — Progress Notes (Signed)
Patient came to see me today for her annual GYN exam. She is on Depo-Provera and is amenorrheic. She uses it for contraception. Last year when there was a break in her treatment her FSH was 11. She is having no vaginal bleeding. She is having no pelvic pain or dyspareunia. She just had her yearly mammogram. Because of the Depo-Provera she had a bone density in 2008 which was normal. 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 She is having a lot of stress both work and social. She does seem somewhat depressed but not suicidal. She previously took antidepressants with Dr. Andee Poles. She does not want to go back on them and wants some  Xanax for sleep. She has had some weight gain recently as well. Her last cholesterol level was slightly elevated and was drawn through PCP and is in the epic record. She does have some night sweats but has had them for years.  Physical examination:kim Julian Reil present. HEENT within normal limits. Neck: Thyroid not large. No masses. Supraclavicular nodes: not enlarged. Breasts: Examined in both sitting and lying  position. No skin changes and no masses. Abdomen: Soft no guarding rebound or masses or hernia. Pelvic: External: Within normal limits. BUS: Within normal limits. Vaginal:within normal limits. Good estrogen effect. No evidence of cystocele rectocele or enterocele. Cervix: clean. Uterus: Normal size and shape. Adnexa: No masses. Rectovaginal exam: Confirmatory and negative. Extremities: Within normal limits.  Assessment: #1. Chronic night sweats was normal FSH #2. Weight gain over the past year #3. Depression with stress #4 elevated cholesterol  Plan: Since nothing has changed since 2012 we elected just to keep her on the Depo-Provera. We will check a TSH. She will work on stress. She was given a prescription for alprazolam. I suggested she go back and talk to Dr. Nolen Mu. She'll return fasting for lab work.

## 2012-04-29 LAB — URINALYSIS W MICROSCOPIC + REFLEX CULTURE
Casts: NONE SEEN
Glucose, UA: NEGATIVE mg/dL
Leukocytes, UA: NEGATIVE
Squamous Epithelial / LPF: NONE SEEN
pH: 6 (ref 5.0–8.0)

## 2012-04-30 ENCOUNTER — Encounter: Payer: Self-pay | Admitting: Obstetrics and Gynecology

## 2012-05-01 ENCOUNTER — Other Ambulatory Visit

## 2012-05-01 DIAGNOSIS — E7800 Pure hypercholesterolemia, unspecified: Secondary | ICD-10-CM

## 2012-05-01 DIAGNOSIS — Z01419 Encounter for gynecological examination (general) (routine) without abnormal findings: Secondary | ICD-10-CM

## 2012-05-01 DIAGNOSIS — R635 Abnormal weight gain: Secondary | ICD-10-CM

## 2012-05-01 LAB — LIPID PANEL
HDL: 47 mg/dL (ref >39)
LDL Cholesterol: 101 mg/dL — ABNORMAL HIGH (ref 0–99)
Total CHOL/HDL Ratio: 3.6 Ratio

## 2012-05-01 LAB — CBC WITH DIFFERENTIAL/PLATELET
Basophils Absolute: 0 10*3/uL (ref 0.0–0.1)
Lymphocytes Relative: 34 % (ref 12–46)
Neutro Abs: 3.5 10*3/uL (ref 1.7–7.7)
Neutrophils Relative %: 57 % (ref 43–77)
Platelets: 308 10*3/uL (ref 150–400)
RDW: 13.7 % (ref 11.5–15.5)
WBC: 6.1 10*3/uL (ref 4.0–10.5)

## 2012-05-06 ENCOUNTER — Ambulatory Visit (INDEPENDENT_AMBULATORY_CARE_PROVIDER_SITE_OTHER): Admitting: Internal Medicine

## 2012-05-06 ENCOUNTER — Telehealth: Payer: Self-pay | Admitting: Internal Medicine

## 2012-05-06 VITALS — BP 112/72 | HR 88 | Temp 98.5°F | Wt 155.0 lb

## 2012-05-06 DIAGNOSIS — S40019A Contusion of unspecified shoulder, initial encounter: Secondary | ICD-10-CM

## 2012-05-06 MED ORDER — HYDROCODONE-ACETAMINOPHEN 7.5-500 MG PO TABS: 1.0000 | ORAL_TABLET | ORAL | Status: AC | PRN

## 2012-05-06 NOTE — Progress Notes (Signed)
  Subjective:    Patient ID: Debra Norton, female    DOB: May 03, 1956, 56 y.o.   MRN: [id number]  HPI Acute visit 4 days ago was at a wedding, she slipped and fell on her back, tilted to the right. Immediate today developed pain at the right shoulder blade. Pain is not worse or better. Has not looked for medical attention up until today Has taken Tylenol and oxycodone left over without much help.  Past Medical History: G4 P0 miscarriage x 3   ectopic x 1 cholelithiais , no surgery   Past Surgical History: ectopic pregnancy R ear surgery for hematoma aprox 4-11   Review of Systems There was no loss of consciousness, denies neck pain. No chest pain or shortness of breath although the pain is worse with cough or sneezing.     Objective:   Physical Exam  Constitutional: She appears well-developed and well-nourished. No distress.  Neck:       No supraclavicular crepitus. Palpation of the cervical spine w/  no tenderness.   Musculoskeletal:       Left shoulder normal Right shoulder without deformity, range of motion normal, she did have pain @ the  shoulder blades with arm rotation. Thoracic spine is palpated, only at the level of T1-2- 3 there was mild tenderness to palpation.  Neurological:       Motor and  DTRs symmetric  Skin: She is not diaphoretic.       Assessment & Plan:

## 2012-05-06 NOTE — Assessment & Plan Note (Signed)
Status post a fall, contusion and the shoulder (shoulder blade). Upper T-spine does not seem to be affected as much. Plan: Motrin Vicodin as needed X-rays. If not better, she will call for a referral.

## 2012-05-06 NOTE — Telephone Encounter (Signed)
Caller: Felicia/Patient; PCP: Willow Ora; CB#: [phone]; Call regarding Injury/Trauma; Onset 05/02/12.  She walked on wet mulch, slipped, fell on her back and head.  Now c/o back pain mostly around shoulder blades.  Pain rated at 2-3 at rest.  Movement of arms can cause pain up to 8 or 9 of 10.  ED disposition per Back Sx protocol. Caller declined and insists on appointment with PCP.  Appt. at 1530 w/ Dr. Drue Novel.

## 2012-05-06 NOTE — Patient Instructions (Signed)
Please get your x-ray at the other [facility]  office located at: [address] [place], across from [facility].  Please go to the basement, this is a walk-in facility, they are open from 8:30 to 5:30 PM. Phone number [phone]. ---- Motrin 200 mg over-the-counter, 2 or 3 tablets every 6 hours as needed for pain. Take with food, watch for stomach irritation-gastritis. If the pain continue, use hydrocodone, watch for somnolence Call if not better in few days.

## 2012-05-07 ENCOUNTER — Ambulatory Visit (INDEPENDENT_AMBULATORY_CARE_PROVIDER_SITE_OTHER)
Admission: RE | Admit: 2012-05-07 | Discharge: 2012-05-07 | Disposition: A | Discharge status: Home / Self Care | Source: Ambulatory Visit | Attending: Internal Medicine | Admitting: Internal Medicine

## 2012-05-07 ENCOUNTER — Encounter: Payer: Self-pay | Admitting: Internal Medicine

## 2012-05-07 DIAGNOSIS — S40029A Contusion of unspecified upper arm, initial encounter: Secondary | ICD-10-CM

## 2012-05-07 DIAGNOSIS — S40019A Contusion of unspecified shoulder, initial encounter: Secondary | ICD-10-CM

## 2012-05-19 ENCOUNTER — Other Ambulatory Visit: Payer: Self-pay | Admitting: Internal Medicine

## 2012-05-19 MED ORDER — HYDROCODONE-ACETAMINOPHEN 7.5-750 MG PO TABS: 1.0000 | ORAL_TABLET | Freq: Three times a day (TID) | ORAL | Status: AC | PRN

## 2012-05-19 NOTE — Telephone Encounter (Signed)
Refill done.  

## 2012-05-19 NOTE — Telephone Encounter (Signed)
Ok to refill 

## 2012-05-19 NOTE — Telephone Encounter (Signed)
Refill Hydrocodon-acetaminoph 7.5-500 Take one tablet by mouth every 4 hours as needed for pain  Not on meds list as a current medication or on snapshot Last ov 5.23.13

## 2012-05-19 NOTE — Telephone Encounter (Signed)
Ok 30, no RF OV if pain continue

## 2012-06-10 ENCOUNTER — Other Ambulatory Visit: Payer: Self-pay | Admitting: *Deleted

## 2012-06-10 MED ORDER — ALPRAZOLAM 0.25 MG PO TABS: 0.2500 mg | ORAL_TABLET | Freq: Every evening | ORAL | Status: AC | PRN

## 2012-06-10 NOTE — Telephone Encounter (Signed)
Refill called in. 

## 2012-07-14 ENCOUNTER — Other Ambulatory Visit: Payer: Self-pay | Admitting: Obstetrics and Gynecology

## 2012-09-01 ENCOUNTER — Encounter: Payer: Self-pay | Admitting: Internal Medicine

## 2012-09-01 ENCOUNTER — Ambulatory Visit (INDEPENDENT_AMBULATORY_CARE_PROVIDER_SITE_OTHER): Admitting: Internal Medicine

## 2012-09-01 VITALS — BP 118/72 | HR 72 | Temp 98.5°F | Wt 156.0 lb

## 2012-09-01 DIAGNOSIS — M25529 Pain in unspecified elbow: Secondary | ICD-10-CM

## 2012-09-01 MED ORDER — HYDROCODONE-ACETAMINOPHEN 5-500 MG PO CAPS: 1.0000 | ORAL_CAPSULE | Freq: Four times a day (QID) | ORAL | Status: DC | PRN

## 2012-09-01 MED ORDER — PREDNISONE 10 MG PO TABS: ORAL_TABLET | ORAL | Status: DC

## 2012-09-01 NOTE — Assessment & Plan Note (Addendum)
Right elbow pain, likely from epicondylitis, she also has symptoms consistent with a entrapment  neuropathy. Plan: Anti-inflammatory treatment with steroids, pain control with Tylenol and Motrin. Recommend to avoid prolonged elbow flexion. If not better will consider a referral.

## 2012-09-01 NOTE — Patient Instructions (Addendum)
Take prednisone for a few days as prescribed Tylenol  500 mg OTC 2 tabs a day every 8 hours as needed for pain  Also may like to try Ibuprofen (Motrin) 200 mg OTC 2 or 3 tabs every 8 hours as need for pain . This medicine can cause gastritis-ulcers in the stomach, if you develop nausea, stomach pain, change in the color of stools : stop ibuprofen and call us

## 2012-09-01 NOTE — Progress Notes (Signed)
  Subjective:    Patient ID: Debra Norton, female    DOB: 1956-07-28, 56 y.o.   MRN: [id number]  HPI Acute visit 2 weeks history of on and off elbow pain, right side. Pain can be at the  External or internal  aspect of the elbow, usually triggered by picking up things. At night also she has numbness mostly at the 4th and fifth finger, very rarely her whole hand is numb. She reports that for the last 2 months has been very busy and typing a lot.  Past Medical History: G4 P0 miscarriage x 3   ectopic x 1 cholelithiais , no surgery   Past Surgical History: ectopic pregnancy R ear surgery for hematoma aprox 4-11   Review of Systems Denies actual injury and a right elbow, no actual swelling. Occasional neck pain and right shoulder pain but symptoms are not severe.     Objective:   Physical Exam  Constitutional: She appears well-developed. No distress.  Neck: Normal range of motion.    Musculoskeletal: She exhibits no edema.       Inspection on palpation of the hands, wrists and elbows normal; range of motion of all the upper extremity joints normal as well.  Neurological:       DTRs and strength symmetric in all muscle groups, pinprick examination of the upper extremities essentially normal  Skin: She is not diaphoretic.      Assessment & Plan:   Few  months ago was prescribed hydrocodone, developed itching and insomnia. Allergy list updated

## 2012-09-11 ENCOUNTER — Telehealth: Payer: Self-pay | Admitting: Internal Medicine

## 2012-09-11 DIAGNOSIS — M25529 Pain in unspecified elbow: Secondary | ICD-10-CM

## 2012-09-11 NOTE — Telephone Encounter (Signed)
Please advise 

## 2012-09-11 NOTE — Telephone Encounter (Signed)
Referral entered  

## 2012-09-11 NOTE — Telephone Encounter (Signed)
Arrange a referral to [facility], dx elbow pain

## 2012-09-11 NOTE — Telephone Encounter (Signed)
Pt called in elbow is not better and Paz told her she may need to see a specialist for elbow- she is not doing well and would like to set this up with you.

## 2012-10-16 ENCOUNTER — Encounter: Payer: Self-pay | Admitting: Internal Medicine

## 2012-10-16 ENCOUNTER — Ambulatory Visit (INDEPENDENT_AMBULATORY_CARE_PROVIDER_SITE_OTHER): Admitting: Internal Medicine

## 2012-10-16 VITALS — BP 104/68 | HR 80 | Temp 98.5°F | Wt 158.0 lb

## 2012-10-16 DIAGNOSIS — R05 Cough: Secondary | ICD-10-CM

## 2012-10-16 DIAGNOSIS — R059 Cough, unspecified: Secondary | ICD-10-CM

## 2012-10-16 MED ORDER — GUAIFENESIN-CODEINE 100-10 MG/5ML PO SYRP: 5.0000 mL | ORAL_SOLUTION | Freq: Every evening | ORAL | Status: DC | PRN

## 2012-10-16 MED ORDER — AZITHROMYCIN 250 MG PO TABS: ORAL_TABLET | ORAL | Status: DC

## 2012-10-16 NOTE — Patient Instructions (Addendum)
Rest, fluids , tylenol For cough, take Mucinex DM twice a day as needed  If the cough continue, take the codeine syrup as needed, watch for allergic reactions, if you have severe reaction go to the hospital. Take the antibiotic as prescribed  (zpack) Call if no better in few days Call anytime if the symptoms are severe

## 2012-10-16 NOTE — Progress Notes (Signed)
  Subjective:    Patient ID: Debra Norton, female    DOB: 1955-12-24, 56 y.o.   MRN: [id number]  HPI Acute visit Symptoms started about 3 weeks ago when she developed a cold, went to see a local provider (she was out of town) she was diagnosed with strep throat and bronchitis, was prescribed Keflex (500 bid?) for few days, although she feels better she is not 100% well. Still has cough which make it difficult to sleep at night, subjective fever. Sputum has changed from green to white. She still has some chest congestion and difficulty breathing when going upstairs.   Past Medical History: G4 P0 miscarriage x 3   ectopic x 1 cholelithiais , no surgery   Past Surgical History: ectopic pregnancy R ear surgery for hematoma aprox 4-11   History   Social History  . Marital Status: Widowed    Spouse Name: N/A    Number of Children: 0  . Years of Education: N/A   Occupational History  . desk job    Social History Main Topics  . Smoking status: Former Games developer  . Smokeless tobacco: Never Used   Comment: quit in high School  . Alcohol Use: 1.0 oz/week    2 drink(s) per week     socially  . Drug Use: No  . Sexually Active: Yes    Birth Control/ Protection: Injection   Other Topics Concern  . Not on file   Social History Narrative   Lives with boy friend    Review of Systems Denies hemoptysis No pain or swelling @ calves No nausea, vomiting, diarrhea. Very little sinus pain and congestion. Some subjective fever.     Objective:   Physical Exam General -- alert, well-developed ; no apparent distress, vital signs are stable HEENT -- TMs normal, throat w/o redness, face symmetric and  slightly tender at the maxillary sinuses but not at the frontal sinuses. Nose is slightly congested Lungs -- normal respiratory effort, no intercostal retractions, no accessory muscle use, and normal breath sounds.   Heart-- normal rate, regular rhythm, no murmur, and no gallop.      Extremities-- no pretibial edema bilaterally, calves symmetric, no tender   Psych-- Cognition and judgment appear intact. Alert and cooperative with normal attention span and concentration.  not anxious appearing and not depressed appearing.       Assessment & Plan:   Cough: Likely partially treated bronchitis, see instructions She reports mild itching with hydrocodone however is unable to sleep at night d/t cough. Will prescribe codeine, pt  to monitor  closely for allergic reactions

## 2012-10-18 ENCOUNTER — Encounter: Payer: Self-pay | Admitting: Internal Medicine

## 2013-01-30 ENCOUNTER — Other Ambulatory Visit: Payer: Self-pay

## 2013-05-26 ENCOUNTER — Telehealth: Payer: Self-pay | Admitting: *Deleted

## 2013-05-26 ENCOUNTER — Other Ambulatory Visit: Payer: Self-pay | Admitting: Obstetrics and Gynecology

## 2013-05-26 MED ORDER — MEDROXYPROGESTERONE ACETATE 150 MG/ML IM SUSP: INTRAMUSCULAR | Status: DC

## 2013-05-26 NOTE — Telephone Encounter (Signed)
Pt has annual scheduled for 06/02/13 and is requesting refill on depo-provera shot. rx sent, pt informed.

## 2013-06-02 ENCOUNTER — Ambulatory Visit (INDEPENDENT_AMBULATORY_CARE_PROVIDER_SITE_OTHER): Admitting: Women's Health

## 2013-06-02 ENCOUNTER — Encounter: Payer: Self-pay | Admitting: Women's Health

## 2013-06-02 ENCOUNTER — Other Ambulatory Visit (HOSPITAL_COMMUNITY)
Admission: RE | Admit: 2013-06-02 | Discharge: 2013-06-02 | Disposition: A | Discharge status: Home / Self Care | Source: Ambulatory Visit | Attending: Gynecology | Admitting: Gynecology

## 2013-06-02 ENCOUNTER — Encounter: Payer: Self-pay | Admitting: Internal Medicine

## 2013-06-02 ENCOUNTER — Encounter: Payer: Self-pay | Admitting: Obstetrics and Gynecology

## 2013-06-02 VITALS — BP 110/62 | Ht 62.5 in | Wt 147.0 lb

## 2013-06-02 DIAGNOSIS — Z833 Family history of diabetes mellitus: Secondary | ICD-10-CM

## 2013-06-02 DIAGNOSIS — Z01419 Encounter for gynecological examination (general) (routine) without abnormal findings: Secondary | ICD-10-CM | POA: Insufficient documentation

## 2013-06-02 DIAGNOSIS — Z309 Encounter for contraceptive management, unspecified: Secondary | ICD-10-CM

## 2013-06-02 DIAGNOSIS — IMO0001 Reserved for inherently not codable concepts without codable children: Secondary | ICD-10-CM

## 2013-06-02 DIAGNOSIS — Z13220 Encounter for screening for lipoid disorders: Secondary | ICD-10-CM

## 2013-06-02 DIAGNOSIS — F411 Generalized anxiety disorder: Secondary | ICD-10-CM

## 2013-06-02 LAB — CBC WITH DIFFERENTIAL/PLATELET
HCT: 39.1 % (ref 36.0–46.0)
Hemoglobin: 13.6 g/dL (ref 12.0–15.0)
Lymphocytes Relative: 26 % (ref 12–46)
Lymphs Abs: 2.2 10*3/uL (ref 0.7–4.0)
MCHC: 34.8 g/dL (ref 30.0–36.0)
Monocytes Absolute: 0.5 10*3/uL (ref 0.1–1.0)
Monocytes Relative: 6 % (ref 3–12)
Neutro Abs: 6 10*3/uL (ref 1.7–7.7)
RBC: 4.44 MIL/uL (ref 3.87–5.11)
WBC: 8.8 10*3/uL (ref 4.0–10.5)

## 2013-06-02 LAB — LIPID PANEL
Cholesterol: 189 mg/dL (ref 0–200)
HDL: 52 mg/dL (ref >39)
Total CHOL/HDL Ratio: 3.6 Ratio
Triglycerides: 136 mg/dL (ref <150)
VLDL: 27 mg/dL (ref 0–40)

## 2013-06-02 MED ORDER — ALPRAZOLAM 0.25 MG PO TABS: 0.2500 mg | ORAL_TABLET | Freq: Every evening | ORAL | Status: DC | PRN

## 2013-06-02 MED ORDER — MEDROXYPROGESTERONE ACETATE 150 MG/ML IM SUSP: INTRAMUSCULAR | Status: DC

## 2013-06-02 NOTE — Progress Notes (Signed)
Debra Norton 08-29-1956 [id number]    History:    The patient presents for annual exam.  Amenorrheic on Depo-Provera. Migraine free since on Depo-Provera. FSH 8 05/2012.cervical dysplasia greater than 20 years ago with normal Paps since. Normal mammogram history. Benign polyp on colonoscopy 2011. Mother history of colon cancer. Fundal fibroid 3 cm. Has lost 10 pounds in the past year with diet and exercise. Normal DEXA  2006.  Past medical history, past surgical history, family history and social history were all reviewed and documented in the EPIC chart. 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 Sister diabetes.   ROS:  A  ROS was performed and pertinent positives and negatives are included in the history.  Exam:  Filed Vitals:   06/02/13 1110  BP: 110/62    General appearance:  Normal Head/Neck:  Normal, without cervical or supraclavicular adenopathy. Thyroid:  Symmetrical, normal in size, without palpable masses or nodularity. Respiratory  Effort:  Normal  Auscultation:  Clear without wheezing or rhonchi Cardiovascular  Auscultation:  Regular rate, without rubs, murmurs or gallops  Edema/varicosities:  Not grossly evident Abdominal  Soft,nontender, without masses, guarding or rebound.  Liver/spleen:  No organomegaly noted  Hernia:  None appreciated  Skin  Inspection:  Grossly normal  Palpation:  Grossly normal Neurologic/psychiatric  Orientation:  Normal with appropriate conversation.  Mood/affect:  Normal  Genitourinary    Breasts: Examined lying and sitting.     Right: Without masses, retractions, discharge or axillary adenopathy.     Left: Without masses, retractions, discharge or axillary adenopathy.   Inguinal/mons:  Normal without inguinal adenopathy  External genitalia:  Normal  BUS/Urethra/Skene's glands:  Normal  Bladder:  Normal  Vagina:  Normal  Cervix:  Normal  Uterus:  normal in size, shape and contour.   Midline and mobile  Adnexa/parametria:     Rt: Without masses or tenderness.   Lt: Without masses or tenderness.  Anus and perineum: Normal  Digital rectal exam: Normal sphincter tone without palpated masses or tenderness  Assessment/Plan:  57 y.o. M. WF G3 P0  for annual exam with complaint of job-related stress.  Normal GYN exam Depo-Provera-migraine free  Plan: Counseling reviewed and declined. Xanax 0.25 every 8 hours as needed reviewed addictive, to use sparingly, has used in the past sparingly. SBE's, Schedule annual mammogram (overdue), calcium rich diet, vitamin D 2000 daily encouraged. Reviewed stopping Depo-Provera, possibly using a progestin only daily since has been migraine free since on the Depo-Provera. FSH at time of next depo needed. CBC, glucose, lipid panel, UA, Pap, Pap normal 2012, new screening guidelines reviewed. Reviewed importance of calcium rich diet while on Depo-Provera. Home Hemoccult card given.    Harrington Challenger WHNP, 1:04 PM 06/02/2013

## 2013-06-02 NOTE — Patient Instructions (Addendum)

## 2013-06-03 LAB — URINALYSIS W MICROSCOPIC + REFLEX CULTURE
Bacteria, UA: NONE SEEN
Bilirubin Urine: NEGATIVE
Casts: NONE SEEN
Crystals: NONE SEEN
Ketones, ur: NEGATIVE mg/dL
Specific Gravity, Urine: 1.01 (ref 1.005–1.030)
pH: 6 (ref 5.0–8.0)

## 2013-08-31 ENCOUNTER — Other Ambulatory Visit

## 2013-08-31 DIAGNOSIS — IMO0001 Reserved for inherently not codable concepts without codable children: Secondary | ICD-10-CM

## 2013-09-01 ENCOUNTER — Other Ambulatory Visit: Payer: Self-pay

## 2013-09-01 DIAGNOSIS — IMO0001 Reserved for inherently not codable concepts without codable children: Secondary | ICD-10-CM

## 2013-09-01 MED ORDER — MEDROXYPROGESTERONE ACETATE 150 MG/ML IM SUSP: INTRAMUSCULAR | Status: DC

## 2013-10-21 ENCOUNTER — Other Ambulatory Visit: Payer: Self-pay

## 2014-01-27 ENCOUNTER — Other Ambulatory Visit: Payer: Self-pay | Admitting: Obstetrics and Gynecology

## 2014-01-27 NOTE — Telephone Encounter (Signed)
Called into pharmacy

## 2014-02-10 ENCOUNTER — Ambulatory Visit: Admitting: Nurse Practitioner

## 2014-03-30 ENCOUNTER — Encounter: Payer: Self-pay | Admitting: Internal Medicine

## 2014-05-24 ENCOUNTER — Encounter: Payer: Self-pay | Admitting: Internal Medicine

## 2014-05-24 ENCOUNTER — Ambulatory Visit (INDEPENDENT_AMBULATORY_CARE_PROVIDER_SITE_OTHER): Admitting: Internal Medicine

## 2014-05-24 VITALS — BP 130/80 | HR 87 | Temp 98.3°F | Wt 150.0 lb

## 2014-05-24 DIAGNOSIS — R42 Dizziness and giddiness: Secondary | ICD-10-CM

## 2014-05-24 DIAGNOSIS — M25529 Pain in unspecified elbow: Secondary | ICD-10-CM

## 2014-05-24 DIAGNOSIS — G4733 Obstructive sleep apnea (adult) (pediatric): Secondary | ICD-10-CM | POA: Insufficient documentation

## 2014-05-24 LAB — COMPREHENSIVE METABOLIC PANEL
ALBUMIN: 4.2 g/dL (ref 3.5–5.2)
ALT: 14 U/L (ref 0–35)
AST: 21 U/L (ref 0–37)
Alkaline Phosphatase: 52 U/L (ref 39–117)
BILIRUBIN TOTAL: 0.9 mg/dL (ref 0.2–1.2)
BUN: 16 mg/dL (ref 6–23)
CO2: 24 meq/L (ref 19–32)
Calcium: 9.5 mg/dL (ref 8.4–10.5)
Chloride: 108 mEq/L (ref 96–112)
Creatinine, Ser: 0.8 mg/dL (ref 0.4–1.2)
GFR: 75.41 mL/min (ref >60.00)
GLUCOSE: 86 mg/dL (ref 70–99)
POTASSIUM: 3.9 meq/L (ref 3.5–5.1)
SODIUM: 140 meq/L (ref 135–145)
TOTAL PROTEIN: 7.1 g/dL (ref 6.0–8.3)

## 2014-05-24 LAB — CBC WITH DIFFERENTIAL/PLATELET
BASOS ABS: 0 10*3/uL (ref 0.0–0.1)
Basophils Relative: 0.5 % (ref 0.0–3.0)
EOS ABS: 0.1 10*3/uL (ref 0.0–0.7)
Eosinophils Relative: 1.3 % (ref 0.0–5.0)
HEMATOCRIT: 39.5 % (ref 36.0–46.0)
HEMOGLOBIN: 13.3 g/dL (ref 12.0–15.0)
LYMPHS ABS: 2 10*3/uL (ref 0.7–4.0)
Lymphocytes Relative: 30.5 % (ref 12.0–46.0)
MCHC: 33.8 g/dL (ref 30.0–36.0)
MCV: 90.2 fl (ref 78.0–100.0)
MONO ABS: 0.4 10*3/uL (ref 0.1–1.0)
Monocytes Relative: 6.1 % (ref 3.0–12.0)
NEUTROS ABS: 4.1 10*3/uL (ref 1.4–7.7)
Neutrophils Relative %: 61.6 % (ref 43.0–77.0)
PLATELETS: 273 10*3/uL (ref 150.0–400.0)
RBC: 4.37 Mil/uL (ref 3.87–5.11)
RDW: 13.1 % (ref 11.5–15.5)
WBC: 6.6 10*3/uL (ref 4.0–10.5)

## 2014-05-24 LAB — TSH: TSH: 3.78 u[IU]/mL (ref 0.35–4.50)

## 2014-05-24 MED ORDER — MELOXICAM 15 MG PO TABS: 15.0000 mg | ORAL_TABLET | Freq: Every day | ORAL | Status: DC | PRN

## 2014-05-24 NOTE — Assessment & Plan Note (Signed)
Dizziness as described in the history of present illness. The Romberg seems to be positive otherwise history and physical are benign. She is not orthostatic but often feels dizzy when stands up. Plan: Labs Brain MRI to eval the posterior fossa Return to the office in one month

## 2014-05-24 NOTE — Progress Notes (Signed)
Subjective:    Patient ID: Debra Norton, female    DOB: July 02, 1956, 58 y.o.   MRN: [id number]  DOS:  05/24/2014 Type of  Visit: Acute, several issues  History:  Complaining   of dizziness described as imbalance going on for the last 3 or 4 months. Happens when she stands up or sometimes by simply standing, does not happen when she turns her head, symptoms last a few seconds. There is no associated nausea, diplopia, near syncope, slurred speech or facial numbness. Not taking any new medications.  Also having bilateral elbow pain, would like a medication. She is currently doing heavy work at home including changing floors.  Also for a while  noted that she wakes up at night shaking, happens every 2 weeks, there is no associated fever, chills, bladder or bowel incontinence. Her boyfriend  has not noted any convulsion type of activity   ROS  No chest pain, difficulty breathing, lower extremity edema. No  nausea, vomiting, diarrhea or blood in the stools. No major problems with anxiety or depression. No dysuria or gross hematuria Admits to daily morning headaches for a while, at some point she was told she had a sleep apnea and was told HAs probably related to that.   Past Medical History  Diagnosis Date  . Cholelithiasis   . Cervical dysplasia   . Anxiety   . Migraines     Past Surgical History  Procedure Laterality Date  . Ectopic pregnancy surgery    . Colposcopy    . Gynecologic cryosurgery    . External ear surgery      History   Social History  . Marital Status: Widowed    Spouse Name: N/A    Number of Children: 0  . Years of Education: N/A   Occupational History  . desk job    Social History Main Topics  . Smoking status: Former Research scientist (life sciences)  . Smokeless tobacco: Never Used     Comment: quit in high School  . Alcohol Use: 1.0 oz/week    2 drink(s) per week     Comment: socially  . Drug Use: No  . Sexual Activity: Yes    Birth Control/ Protection:  Injection   Other Topics Concern  . Not on file   Social History Narrative   Lives with boy friend      G4 P0    miscarriage x 3    ectopic x 1         Medication List       This list is accurate as of: 05/24/14  7:24 PM.  Always use your most recent med list.               ALPRAZolam 0.25 MG tablet  Commonly known as:  XANAX  TAKE 1 TABLET BY MOUTH AT BEDTIME AS NEEDED FOR SLEEP     medroxyPROGESTERone 150 MG/ML injection  Commonly known as:  DEPO-PROVERA  INJECT IM EVERY 3 MONTHS     meloxicam 15 MG tablet  Commonly known as:  MOBIC  Take 1 tablet (15 mg total) by mouth daily as needed for pain.           Objective:   Physical Exam BP 130/80  Pulse 87  Temp(Src) 98.3 F (36.8 C)  Wt 150 lb (68.04 kg)  SpO2 98% Orthostatics,  standing up 130/80, pulse 87,  Flat 110/65, pulse 70 (did report mild dizziness when stood up) General -- alert, well-developed, NAD.  Neck --no  thyromegaly , normal carotid pulse  HEENT-- Not pale. Lungs -- normal respiratory effort, no intercostal retractions, no accessory muscle use, and normal breath sounds.  Heart-- normal rate, regular rhythm, no murmur.   Extremities-- no pretibial edema bilaterally  Neurologic--  alert & oriented X3. Speech normal, gait appropriate for age, strength symmetric and appropriate for age.  DTRs symmetric. EOMI, PERLA   Romberg present  Psych-- Cognition and judgment appear intact. Cooperative with normal attention span and concentration. No anxious or depressed appearing.         Assessment & Plan:

## 2014-05-24 NOTE — Patient Instructions (Addendum)
Get your blood work before you leave   Will schedule a MRI   Next visit is for routine check up regards your dizziness in 1 months Call if symptoms increase

## 2014-05-24 NOTE — Progress Notes (Signed)
Pre visit review using our clinic review tool, if applicable. No additional management support is needed unless otherwise documented below in the visit note. 

## 2014-05-24 NOTE — Assessment & Plan Note (Signed)
Recommended meloxicam as needed, GI side effects discussed

## 2014-05-28 ENCOUNTER — Ambulatory Visit
Admission: RE | Admit: 2014-05-28 | Discharge: 2014-05-28 | Disposition: A | Discharge status: Home / Self Care | Source: Ambulatory Visit | Attending: Internal Medicine | Admitting: Internal Medicine

## 2014-05-28 DIAGNOSIS — R42 Dizziness and giddiness: Secondary | ICD-10-CM

## 2014-05-28 MED ORDER — GADOBENATE DIMEGLUMINE 529 MG/ML IV SOLN
14.0000 mL | Freq: Once | INTRAVENOUS | Status: AC | PRN
  Administered 2014-05-28: 14 mL via INTRAVENOUS

## 2014-06-03 ENCOUNTER — Encounter: Payer: Self-pay | Admitting: Women's Health

## 2014-06-03 ENCOUNTER — Ambulatory Visit (INDEPENDENT_AMBULATORY_CARE_PROVIDER_SITE_OTHER): Admitting: Women's Health

## 2014-06-03 VITALS — BP 120/70 | Ht 62.0 in | Wt 148.0 lb

## 2014-06-03 DIAGNOSIS — F411 Generalized anxiety disorder: Secondary | ICD-10-CM

## 2014-06-03 DIAGNOSIS — Z01419 Encounter for gynecological examination (general) (routine) without abnormal findings: Secondary | ICD-10-CM

## 2014-06-03 MED ORDER — ALPRAZOLAM 0.25 MG PO TABS: ORAL_TABLET | ORAL | Status: DC

## 2014-06-03 NOTE — Patient Instructions (Signed)
Health Recommendations for Postmenopausal Women Respected and ongoing research has looked at the most common causes of death, disability, and poor quality of life in postmenopausal women. The causes include heart disease, diseases of blood vessels, diabetes, depression, cancer, and bone loss (osteoporosis). Many things can be done to help lower the chances of developing these and other common problems: CARDIOVASCULAR DISEASE Heart Disease: A heart attack is a medical emergency. Know the signs and symptoms of a heart attack. Below are things women can do to reduce their risk for heart disease.   Do not smoke. If you smoke, quit.  Aim for a healthy weight. Being overweight causes many preventable deaths. Eat a healthy and balanced diet and drink an adequate amount of liquids.  Get moving. Make a commitment to be more physically active. Aim for 30 minutes of activity on most, if not all days of the week.  Eat for heart health. Choose a diet that is low in saturated fat and cholesterol and eliminate trans fat. Include whole grains, vegetables, and fruits. Read and understand the labels on food containers before buying.  Know your numbers. Ask your caregiver to check your blood pressure, cholesterol (total, HDL, LDL, triglycerides) and blood glucose. Work with your caregiver on improving your entire clinical picture.  High blood pressure. Limit or stop your table salt intake (try salt substitute and food seasonings). Avoid salty foods and drinks. Read labels on food containers before buying. Eating well and exercising can help control high blood pressure. STROKE  Stroke is a medical emergency. Stroke may be the result of a blood clot in a blood vessel in the brain or by a brain hemorrhage (bleeding). Know the signs and symptoms of a stroke. To lower the risk of developing a stroke:  Avoid fatty foods.  Quit smoking.  Control your diabetes, blood pressure, and irregular heart rate. THROMBOPHLEBITIS  (BLOOD CLOT) OF THE LEG  Becoming overweight and leading a stationary lifestyle may also contribute to developing blood clots. Controlling your diet and exercising will help lower the risk of developing blood clots. CANCER SCREENING  Breast Cancer: Take steps to reduce your risk of breast cancer.  You should practice "breast self-awareness." This means understanding the normal appearance and feel of your breasts and should include breast self-examination. Any changes detected, no matter how small, should be reported to your caregiver.  After age 40, you should have a clinical breast exam (CBE) every year.  Starting at age 40, you should consider having a mammogram (breast X-ray) every year.  If you have a family history of breast cancer, talk to your caregiver about genetic screening.  If you are at high risk for breast cancer, talk to your caregiver about having an MRI and a mammogram every year.  Intestinal or Stomach Cancer: Tests to consider are a rectal exam, fecal occult blood, sigmoidoscopy, and colonoscopy. Women who are high risk may need to be screened at an earlier age and more often.  Cervical Cancer:  Beginning at age 30, you should have a Pap test every 3 years as long as the past 3 Pap tests have been normal.  If you have had past treatment for cervical cancer or a condition that could lead to cancer, you need Pap tests and screening for cancer for at least 20 years after your treatment.  If you had a hysterectomy for a problem that was not cancer or a condition that could lead to cancer, then you no longer need Pap tests.    If you are between ages 65 and 70, and you have had normal Pap tests going back 10 years, you no longer need Pap tests.  If Pap tests have been discontinued, risk factors (such as a new sexual partner) need to be reassessed to determine if screening should be resumed.  Some medical problems can increase the chance of getting cervical cancer. In these  cases, your caregiver may recommend more frequent screening and Pap tests.  Uterine Cancer: If you have vaginal bleeding after reaching menopause, you should notify your caregiver.  Ovarian cancer: Other than yearly pelvic exams, there are no reliable tests available to screen for ovarian cancer at this time except for yearly pelvic exams.  Lung Cancer: Yearly chest X-rays can detect lung cancer and should be done on high risk women, such as cigarette smokers and women with chronic lung disease (emphysema).  Skin Cancer: A complete body skin exam should be done at your yearly examination. Avoid overexposure to the sun and ultraviolet light lamps. Use a strong sun block cream when in the sun. All of these things are important in lowering the risk of skin cancer. MENOPAUSE Menopause Symptoms: Hormone therapy products are effective for treating symptoms associated with menopause:  Moderate to severe hot flashes.  Night sweats.  Mood swings.  Headaches.  Tiredness.  Loss of sex drive.  Insomnia.  Other symptoms. Hormone replacement carries certain risks, especially in older women. Women who use or are thinking about using estrogen or estrogen with progestin treatments should discuss that with their caregiver. Your caregiver will help you understand the benefits and risks. The ideal dose of hormone replacement therapy is not known. The Food and Drug Administration (FDA) has concluded that hormone therapy should be used only at the lowest doses and for the shortest amount of time to reach treatment goals.  OSTEOPOROSIS Protecting Against Bone Loss and Preventing Fracture: If you use hormone therapy for prevention of bone loss (osteoporosis), the risks for bone loss must outweigh the risk of the therapy. Ask your caregiver about other medications known to be safe and effective for preventing bone loss and fractures. To guard against bone loss or fractures, the following is recommended:  If  you are less than age 50, take 1000 mg of calcium and at least 600 mg of Vitamin D per day.  If you are greater than age 50 but less than age 70, take 1200 mg of calcium and at least 600 mg of Vitamin D per day.  If you are greater than age 70, take 1200 mg of calcium and at least 800 mg of Vitamin D per day. Smoking and excessive alcohol intake increases the risk of osteoporosis. Eat foods rich in calcium and vitamin D and do weight bearing exercises several times a week as your caregiver suggests. DIABETES Diabetes Melitus: If you have Type I or Type 2 diabetes, you should keep your blood sugar under control with diet, exercise and recommended medication. Avoid too many sweets, starchy and fatty foods. Being overweight can make control more difficult. COGNITION AND MEMORY Cognition and Memory: Menopausal hormone therapy is not recommended for the prevention of cognitive disorders such as Alzheimer's disease or memory loss.  DEPRESSION  Depression may occur at any age, but is common in elderly women. The reasons may be because of physical, medical, social (loneliness), or financial problems and needs. If you are experiencing depression because of medical problems and control of symptoms, talk to your caregiver about this. Physical activity and   exercise may help with mood and sleep. Community and volunteer involvement may help your sense of value and worth. If you have depression and you feel that the problem is getting worse or becoming severe, talk to your caregiver about treatment options that are best for you. ACCIDENTS  Accidents are common and can be serious in the elderly woman. Prepare your house to prevent accidents. Eliminate throw rugs, place hand bars in the bath, shower and toilet areas. Avoid wearing high heeled shoes or walking on wet, snowy, and icy areas. Limit or stop driving if you have vision or hearing problems, or you feel you are unsteady with you movements and  reflexes. HEPATITIS C Hepatitis C is a type of viral infection affecting the liver. It is spread mainly through contact with blood from an infected person. It can be treated, but if left untreated, it can lead to severe liver damage over years. Many people who are infected do not know that the virus is in their blood. If you are a "baby-boomer", it is recommended that you have one screening test for Hepatitis C. IMMUNIZATIONS  Several immunizations are important to consider having during your senior years, including:   Tetanus, diptheria, and pertussis booster shot.  Influenza every year before the flu season begins.  Pneumonia vaccine.  Shingles vaccine.  Others as indicated based on your specific needs. Talk to your caregiver about these. Document Released: 01/24/2006 Document Revised: 11/18/2012 Document Reviewed: 09/19/2008 [facility] Patient Information 2015 [place], [place]. This information is not intended to replace advice given to you by your health care provider. Make sure you discuss any questions you have with your health care provider.

## 2014-06-03 NOTE — Progress Notes (Signed)
Debra Norton May 06, 1956 [id number]  History:    Presents for annual exam. Amenorrheic on Depo Provera. [facility] 08/2013 13. Denies hot flashes or night sweats. Complains of occasional dizziness and loss of balance x 3 months, with negative workup and MRI per PCP.  Reports mole on back that has changed recently. Cervical dysplasia greater than 20 years ago, normal paps since. Normal mammogram history. Benign polyp on colonoscopy 2011 recommended 5 year follow up per GI. Normal DEXA 2014.  Past medical history, past surgical history, family history and social history were all reviewed and documented in the EPIC chart. Mother colon cancer. Works [place] [employer].  ROS:  A  12 point ROS was performed and pertinent positives and negatives are included.  Exam:  Filed Vitals:   06/03/14 1101  BP: 120/70    General appearance:  Normal Thyroid:  Symmetrical, normal in size, without palpable masses or nodularity. Respiratory  Auscultation:  Clear without wheezing or rhonchi Cardiovascular  Auscultation:  Regular rate, without rubs, murmurs or gallops  Edema/varicosities:  Not grossly evident Abdominal  Soft,nontender, without masses, guarding or rebound.  Liver/spleen:  No organomegaly noted  Hernia:  None appreciated  Skin  Inspection:  Grossly normal. Numerous scattered raised moles on back   Breasts: Examined lying and sitting.     Right: Without masses, retractions, discharge or axillary adenopathy.    Left: Without masses, retractions, discharge or axillary adenopathy. Gentitourinary   Inguinal/mons:  Normal without inguinal adenopathy  External genitalia:  Normal  BUS/Urethra/Skene's glands:  Normal  Vagina:  Normal  Cervix:  Normal  Uterus:  retroverted, normal in size, shape and contour.  Midline and mobile  Adnexa/parametria:     Rt: Without masses or tenderness.   Lt: Without masses or tenderness.  Anus and perineum: Normal  Digital rectal exam: Normal sphincter tone  without palpated masses or tenderness  Assessment/Plan:  58 y.o. G4P0 for annual exam.  D/C Depo-Provera, [facility] 08/2013 13 Dizziness Mammogram overdue Anxiety  Plan: D/C depo provera, recheck [place]. Increased fluid intake recommended. Sun protection, SBE's reviewed, will schedule mammogram. Calcium rich diet, exercise and 2000U of vitamin D daily recommended. UA, Lipid panel, Vitamin D. Xanax .25mg  q 8 hrs prn prescription, proper use, risk for dependence reviewed. (used 1 Rx last year). Annual skin check with dermatologist recommended.   Note: This dictation was prepared with Dragon/digital dictation.  Any transcriptional errors that result are unintentional. Huel Cote [facility], 11:44 AM 06/03/2014

## 2014-06-04 LAB — URINALYSIS W MICROSCOPIC + REFLEX CULTURE
BACTERIA UA: NONE SEEN
BILIRUBIN URINE: NEGATIVE
CRYSTALS: NONE SEEN
Casts: NONE SEEN
Glucose, UA: NEGATIVE mg/dL
HGB URINE DIPSTICK: NEGATIVE
Ketones, ur: NEGATIVE mg/dL
Leukocytes, UA: NEGATIVE
NITRITE: NEGATIVE
Protein, ur: NEGATIVE mg/dL
Specific Gravity, Urine: 1.018 (ref 1.005–1.030)
Squamous Epithelial / LPF: NONE SEEN
UROBILINOGEN UA: 0.2 mg/dL (ref 0.0–1.0)
pH: 5.5 (ref 5.0–8.0)

## 2014-06-04 LAB — FOLLICLE STIMULATING HORMONE: FSH: 10.3 m[IU]/mL

## 2014-06-04 LAB — LIPID PANEL
CHOL/HDL RATIO: 3.1 ratio
Cholesterol: 165 mg/dL (ref 0–200)
HDL: 54 mg/dL (ref >39)
LDL Cholesterol: 96 mg/dL (ref 0–99)
Triglycerides: 74 mg/dL (ref <150)
VLDL: 15 mg/dL (ref 0–40)

## 2014-06-08 LAB — VITAMIN D 1,25 DIHYDROXY
VITAMIN D 1, 25 (OH) TOTAL: 56 pg/mL (ref 18–72)
Vitamin D2 1, 25 (OH)2: <8 pg/mL
Vitamin D3 1, 25 (OH)2: 56 pg/mL

## 2014-06-23 ENCOUNTER — Ambulatory Visit (INDEPENDENT_AMBULATORY_CARE_PROVIDER_SITE_OTHER): Admitting: Internal Medicine

## 2014-06-23 VITALS — BP 95/60 | HR 83 | Temp 97.8°F | Wt 149.0 lb

## 2014-06-23 DIAGNOSIS — R42 Dizziness and giddiness: Secondary | ICD-10-CM

## 2014-06-23 NOTE — Patient Instructions (Signed)
Will refer you to neurology 

## 2014-06-23 NOTE — Assessment & Plan Note (Signed)
Ongoing symptoms for 4 months, recent MRI essentially negative. Most likely is a peripheral issue however will refer to neurology, if there assessment rule out a CNS issue, will refer to PT ([facility])

## 2014-06-23 NOTE — Progress Notes (Signed)
   Subjective:    Patient ID: Debra Norton, female    DOB: [birth year], 58 y.o.   MRN: [id number]  DOS:  06/23/2014 Type of visit - description: Followup from previous visit History: Since the last time she was here she feels about the same, still having episodes of dizziness as described as before.  ROS Denies chest pain or palpitations No nausea, vomiting, diarrhea or blood in the stools. No headaches  Past Medical History  Diagnosis Date  . Cholelithiasis   . Cervical dysplasia   . Anxiety   . Migraines     Past Surgical History  Procedure Laterality Date  . Ectopic pregnancy surgery    . Colposcopy    . Gynecologic cryosurgery    . External ear surgery      History   Social History  . Marital Status: Widowed    Spouse Name: N/A    Number of Children: 0  . Years of Education: N/A   Occupational History  . desk job    Social History Main Topics  . Smoking status: Former Research scientist (life sciences)  . Smokeless tobacco: Never Used     Comment: quit in high School  . Alcohol Use: 1.0 oz/week    2 drink(s) per week     Comment: socially  . Drug Use: No  . Sexual Activity: Yes    Birth Control/ Protection: Injection   Other Topics Concern  . Not on file   Social History Narrative   Lives with boy friend      G4 P0    miscarriage x 3    ectopic x 1         Medication List       This list is accurate as of: 06/23/14  4:22 PM.  Always use your most recent med list.               ALPRAZolam 0.25 MG tablet  Commonly known as:  XANAX  TAKE 1 TABLET BY MOUTH AT BEDTIME AS NEEDED FOR SLEEP     medroxyPROGESTERone 150 MG/ML injection  Commonly known as:  DEPO-PROVERA  Inject 150 mg into the muscle every 3 (three) months.     meloxicam 15 MG tablet  Commonly known as:  MOBIC  Take 1 tablet (15 mg total) by mouth daily as needed for pain.           Objective:   Physical Exam BP 95/60  Pulse 83  Temp(Src) 97.8 F (36.6 C)  Wt 149 lb (67.586 kg)  SpO2  98%  General -- alert, well-developed, NAD.   Extremities-- no pretibial edema bilaterally  Neurologic--  alert & oriented X3. Speech normal, gait appropriate for age, strength symmetric and appropriate for age.  DTRs symmetric. EOMI, PERLA   Psych-- Cognition and judgment appear intact. Cooperative with normal attention span and concentration. No anxious or depressed appearing.      Assessment & Plan:

## 2014-06-23 NOTE — Progress Notes (Signed)
Pre visit review using our clinic review tool, if applicable. No additional management support is needed unless otherwise documented below in the visit note. 

## 2014-07-12 ENCOUNTER — Ambulatory Visit (INDEPENDENT_AMBULATORY_CARE_PROVIDER_SITE_OTHER): Admitting: Neurology

## 2014-07-12 ENCOUNTER — Encounter: Payer: Self-pay | Admitting: Neurology

## 2014-07-12 VITALS — BP 104/60 | HR 76 | Resp 16 | Ht 62.0 in | Wt 151.0 lb

## 2014-07-12 DIAGNOSIS — R269 Unspecified abnormalities of gait and mobility: Secondary | ICD-10-CM

## 2014-07-12 DIAGNOSIS — R2689 Other abnormalities of gait and mobility: Secondary | ICD-10-CM

## 2014-07-12 DIAGNOSIS — R413 Other amnesia: Secondary | ICD-10-CM

## 2014-07-12 DIAGNOSIS — R251 Tremor, unspecified: Secondary | ICD-10-CM

## 2014-07-12 DIAGNOSIS — R259 Unspecified abnormal involuntary movements: Secondary | ICD-10-CM

## 2014-07-12 LAB — VITAMIN B12: Vitamin B-12: 392 pg/mL (ref 211–911)

## 2014-07-12 NOTE — Progress Notes (Signed)
NEUROLOGY CONSULTATION NOTE  Debra Norton MRN: [id number] DOB: 16-Mar-1956  Referring provider: Dr. Larose Kells Primary care provider: Dr. Larose Kells  Reason for consult:  Imbalance, memory problems, tremor  HISTORY OF PRESENT ILLNESS: Debra Norton is a 58 year old left-handed woman with history of migraines, prior B12 deficiency, cholelithiasis and anxiety who presents for multiple symptoms, such as imbalance, memory problems and tremor.  Records and images reviewed.   Symptoms collectively started 3-4 months ago.   While standing, she will spontaneously feel "off kilter ". She will need to grab on her hold on to something. She tends to veer either to the left or right but not forward or backwards. It lasts approximately 10 seconds. There is no associated leg weakness her sensation of her knees giving out. There is no associated numbness or visual disturbance. There is no associated dizziness or vertigo. She has not had any falls. It is not triggered by any specific movement or position. It doesn't occur if any particular time of day. It occurs several times a week. On only one occasion, there was an associated lightheadedness, like she was going to pass out. There is no associated palpitations, sweating, or hyperventilation. Around the same time, she began noticing a brief tremor that wakes her up from sleep. She'll have diffuse tremors of the body, like she is shivering, but she doesn't feel cold. For a longer period of time, she has had some mild memory problems. For example, she was driving to the bank and forgot how to get there. Another time, she was putting away the groceries and accidentally put the milk in a cabinet instead of the refrigerator. Another time, she meant to fill the sugar bowl with sugar but instead toward and into her coffee. She also reports slowed reaction time.  For example, she accidentally placed her hand on the hot oven door and it took a little longer for her to  register and remove her hand.  She does have a history of chronic headaches, which are diffuse and not associated with any other symptoms. It is assumed that they are related to uncontrolled obstructive sleep apnea. The headaches are worse in the mornings. Unfortunately, CPAP did not seem to be helpful. She denies any changes in medications prior to onset of these symptoms. She does have her motor history of them and B12 deficiency in which she received injections. She does report having had a neuropsychological evaluation 10 years ago after her late husband passed away from cancer. She denies any new stressors or depression.  She denies pre-occupation when performing these tasks.  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  05/24/14 LABS:  CBC and CMP unremarkable.  TSH also unremarkable with value of 3.78.    PAST MEDICAL HISTORY: Past Medical History  Diagnosis Date  . Cholelithiasis   . Cervical dysplasia   . Anxiety   . Migraines     PAST SURGICAL HISTORY: Past Surgical History  Procedure Laterality Date  . Ectopic pregnancy surgery    . Colposcopy    . Gynecologic cryosurgery    . External ear surgery      MEDICATIONS: Current Outpatient Prescriptions on File Prior to Visit  Medication Sig Dispense Refill  . ALPRAZolam (XANAX) 0.25 MG tablet TAKE 1 TABLET BY MOUTH AT BEDTIME AS NEEDED FOR SLEEP  30 tablet  1  .  medroxyPROGESTERone (DEPO-PROVERA) 150 MG/ML injection Inject 150 mg into the muscle every 3 (three) months.      . meloxicam (MOBIC) 15 MG tablet Take 1 tablet (15 mg total) by mouth daily as needed for pain.  30 tablet  2   No current facility-administered medications on file prior to visit.    ALLERGIES: Allergies  Allergen Reactions  . Diphenhydramine Hcl     REACTION: VERY WIRED/palpitations  Benadryl  .  Penicillins     REACTION: HIVES  . Tetanus Toxoid     REACTION: REALLY BAD SWELLING  . Hydrocodone     Insomnia, itching (mild)    FAMILY HISTORY: Family History  Problem Relation Age of Onset  . Cancer Mother     colon  . Diabetes Sister   . Ovarian cancer Cousin     Mat. 1st cousin    SOCIAL HISTORY: History   Social History  . Marital Status: Widowed    Spouse Name: N/A    Number of Children: 0  . Years of Education: N/A   Occupational History  . desk job    Social History Main Topics  . Smoking status: Former Research scientist (life sciences)  . Smokeless tobacco: Never Used     Comment: quit in high School  . Alcohol Use: 1.0 oz/week    2 drink(s) per week     Comment: socially  . Drug Use: No  . Sexual Activity: Yes    Birth Control/ Protection: Injection   Other Topics Concern  . Not on file   Social History Narrative   Lives with boy friend      G4 P0    miscarriage x 3    ectopic x 1     REVIEW OF SYSTEMS: Constitutional: No fevers, chills, or sweats, no generalized fatigue, change in appetite Eyes: No visual changes, double vision, eye pain Ear, nose and throat: No hearing loss, ear pain, nasal congestion, sore throat Cardiovascular: No chest pain, palpitations Respiratory:  No shortness of breath at rest or with exertion, wheezes GastrointestinaI: No nausea, vomiting, diarrhea, abdominal pain, fecal incontinence Genitourinary:  No dysuria, urinary retention or frequency Musculoskeletal:  No neck pain, back pain Integumentary: No rash, pruritus, skin lesions Neurological: as above Psychiatric: No depression, insomnia, anxiety Endocrine: No palpitations, fatigue, diaphoresis, mood swings, change in appetite, change in weight, increased thirst Hematologic/Lymphatic:  No anemia, purpura, petechiae. Allergic/Immunologic: no itchy/runny eyes, nasal congestion, recent allergic reactions, rashes  PHYSICAL EXAM: Filed Vitals:   07/12/14 0823  BP: 104/60  Pulse: 76    Resp: 16   General: No acute distress Head:  Normocephalic/atraumatic Neck: supple, no paraspinal tenderness, full range of motion Back: No paraspinal tenderness Heart: regular rate and rhythm Lungs: Clear to auscultation bilaterally. Vascular: No carotid bruits. Neurological Exam: Mental status: alert and oriented to person, place, and time, recent and  language intact. Montreal Cognitive Assessment  07/12/2014  Visuospatial/ Executive (0/5) 5  Naming (0/3) 3  Attention: Read list of digits (0/2) 2  Attention: Read list of letters (0/1) 1  Attention: Serial 7 subtraction starting at 100 (0/3) 3  Language: Repeat phrase (0/2) 1  Language : Fluency (0/1) 1  Abstraction (0/2) 2  Delayed Recall (0/5) 2  Orientation (0/6) 6  Total 26  Adjusted Score (based on education) 26   remote memory intact, fund of knowledge intact, attention and concentration intact, speech fluent and not dysarthric, Cranial nerves: CN I: not tested CN II: pupils equal, round and reactive to light,  visual fields intact, fundi unremarkable, without vessel changes, exudates, hemorrhages or papilledema. CN III, IV, VI:  full range of motion, no nystagmus, no ptosis CN V: facial sensation intact CN VII: upper and lower face symmetric CN VIII: hearing intact CN IX, X: gag intact, uvula midline CN XI: sternocleidomastoid and trapezius muscles intact CN XII: tongue midline Bulk & Tone: normal, no fasciculations. Motor: 5 out of 5 throughout, with a little reduced effort in the upper extremities secondary to pain from tennis elbow and carpal tunnel. Sensation: Pinprick and vibration intact Deep Tendon Reflexes: 2+ throughout, perhaps mildly more brisk in the patellar is but symmetric. Toes downgoing. Finger to nose testing: No tremor or dysmetria. Heel to shin: No dysmetria. Gait: Normal station and stride. Able to turn, walk on toes, on heels, and in tandem. Romberg with sway.  IMPRESSION: Episodic  imbalance.  I have no real explanation for this.  She is a little unsteady when she closes her eyes, however her exam is otherwise unremarkable with no evidence of weakness, sensory deficits, ataxia or dysmetria. Cognitive deficits.  She was not able to recall 3 of 5 words, however her MoCA testing was otherwise unremarkable.  No clear evidence of cognitive deficits on exam.  However, due to her cognitive deficits, such as putting away the mild incorrectly or one time disoriented while driving familiar route, neuropsychological testing can be considered. Tremor.  It does not really sound physiologic.  It really doesn't seem like seizure and she exhibits no tremor on exam.  PLAN: 1.  We will get EEG to assess for any underlying epileptogenic cause for the episodic imbalance and tremor, although my suspicion is low 2.  I would monitor symptoms for now.  Plan would be to follow up in 6 months for re-evaluation.  If there is any progression of symptoms, regarding memory, we can refer for neuropsychological testing.   3.  Check B12 level 4.  She is to call sooner if symptoms progress prior to 6 months.  Thank you for allowing me to take part in the care of this patient.  Metta Clines, DO  CC:  Kathlene November, MD

## 2014-07-12 NOTE — Patient Instructions (Signed)
I really have no explanation for your symptoms at this time.  I would just monitor balance and memory for now. 1.  Follow up in 6 months for another evaluation of balance and memory 2.  In the meantime, we can check vitamin B12 level and get EEG to assess the tremors 3.  Call sooner with any problems.  Other options for memory include formal neuropsychological testing.

## 2014-07-13 ENCOUNTER — Telehealth: Payer: Self-pay | Admitting: *Deleted

## 2014-07-13 ENCOUNTER — Encounter: Payer: Self-pay | Admitting: Women's Health

## 2014-07-13 NOTE — Telephone Encounter (Signed)
Patient is aware of normal lab results  

## 2014-07-13 NOTE — Telephone Encounter (Signed)
Message copied by Claudie Revering on Wed Jul 13, 2014  8:25 AM ------      Message from: JAFFE, ADAM R      Created: Wed Jul 13, 2014  7:04 AM       b12 is okay      ----- Message -----         From: [facility]         Sent: 07/12/2014  11:26 PM           To: Dudley Major, DO                   ------

## 2014-07-14 ENCOUNTER — Other Ambulatory Visit: Payer: Self-pay | Admitting: *Deleted

## 2014-07-14 DIAGNOSIS — R928 Other abnormal and inconclusive findings on diagnostic imaging of breast: Secondary | ICD-10-CM

## 2014-07-18 ENCOUNTER — Other Ambulatory Visit: Payer: Self-pay | Admitting: *Deleted

## 2014-07-18 ENCOUNTER — Encounter: Payer: Self-pay | Admitting: Women's Health

## 2014-07-18 DIAGNOSIS — R928 Other abnormal and inconclusive findings on diagnostic imaging of breast: Secondary | ICD-10-CM

## 2014-07-21 ENCOUNTER — Ambulatory Visit (INDEPENDENT_AMBULATORY_CARE_PROVIDER_SITE_OTHER): Admitting: Neurology

## 2014-07-21 DIAGNOSIS — R2689 Other abnormalities of gait and mobility: Secondary | ICD-10-CM

## 2014-07-21 DIAGNOSIS — R413 Other amnesia: Secondary | ICD-10-CM

## 2014-07-21 DIAGNOSIS — R251 Tremor, unspecified: Secondary | ICD-10-CM

## 2014-07-25 ENCOUNTER — Other Ambulatory Visit

## 2014-07-25 ENCOUNTER — Telehealth: Payer: Self-pay | Admitting: Neurology

## 2014-07-25 NOTE — Telephone Encounter (Signed)
Pt wants the test results please call [phone]

## 2014-07-27 ENCOUNTER — Encounter: Payer: Self-pay | Admitting: Women's Health

## 2014-08-02 ENCOUNTER — Telehealth: Payer: Self-pay | Admitting: Neurology

## 2014-08-02 NOTE — Telephone Encounter (Signed)
Pt called wanting to f/u and see if the results were ready from the EEG she had done 07/21/14. C/B [phone]

## 2014-08-03 NOTE — Procedures (Signed)
ELECTROENCEPHALOGRAM REPORT  Date of Study: 07/21/2014  Patient's Name: Debra Norton MRN: [id number] Date of Birth: [birth year]  Referring Provider: Metta Clines, DO  Indication: Episodic imbalance, tremor  Medications: Xanax  Technical Summary: This is a multichannel digital EEG recording, using the international 10-20 placement system.  Spike detection software was employed.  Description: The EEG background is symmetric, with a well-developed posterior dominant rhythm of 10-11 Hz, which is reactive to eye opening and closing.  Diffuse beta activity is seen, with a bilateral frontal preponderance.  No focal or generalized abnormalities are seen.  No focal or generalized epileptiform discharges are seen.  Stage II sleep is not seen.  Hyperventilation and photic stimulation were performed, and produced no abnormalities.  ECG revealed normal cardiac rate and rhythm.  Impression: This is a normal routine EEG of the awake and drowsy states, with activating procedures.  A normal study does not rule out the possibility of a seizure disorder in this patient.  Adam R. Tomi Likens, DO

## 2014-10-17 ENCOUNTER — Encounter: Payer: Self-pay | Admitting: Neurology

## 2015-02-16 ENCOUNTER — Telehealth: Payer: Self-pay | Admitting: *Deleted

## 2015-02-16 NOTE — Telephone Encounter (Signed)
Left on pt voicemail to call [facility] to schedule follow up appointment, [facility] sent letter stating patient never scheduled this from 07/13/14 abnormal mammogram

## 2015-03-01 ENCOUNTER — Encounter: Payer: Self-pay | Admitting: Women's Health

## 2015-03-03 ENCOUNTER — Ambulatory Visit (INDEPENDENT_AMBULATORY_CARE_PROVIDER_SITE_OTHER): Admitting: Medical

## 2015-03-03 ENCOUNTER — Encounter: Payer: Self-pay | Admitting: Medical

## 2015-03-03 VITALS — BP 130/70 | HR 101 | Temp 98.9°F | Ht 62.0 in | Wt 147.8 lb

## 2015-03-03 DIAGNOSIS — J029 Acute pharyngitis, unspecified: Secondary | ICD-10-CM

## 2015-03-03 MED ORDER — AZITHROMYCIN 250 MG PO TABS: ORAL_TABLET | ORAL | Status: DC

## 2015-03-03 NOTE — Progress Notes (Signed)
Pre visit review using our clinic review tool, if applicable. No additional management support is needed unless otherwise documented below in the visit note. 

## 2015-03-03 NOTE — Patient Instructions (Signed)
Acute pharyngitis Your strep test was negative. However, your physical exam and clinical presentation is suspicious for strep and it is important to note that rapid strep test can be falsely negative. So I am going to give you azithromycin  antibiotic today based on your exam and clinical presentation.  Rest hydrate, tylenol for fever, and warm salt water gargles.   Follow up in 7 days or as needed.      Note you mentioned some nasal congestion and runny nose. If symptoms worsen indicating allergies. You could get claritin and flonase otc.

## 2015-03-03 NOTE — Assessment & Plan Note (Signed)
Your strep test was negative. However, your physical exam and clinical presentation is suspicious for strep and it is important to note that rapid strep test can be falsely negative. So I am going to give you azithromycin  antibiotic today based on your exam and clinical presentation.  Rest hydrate, tylenol for fever, and warm salt water gargles.   Follow up in 7 days or as needed.

## 2015-03-03 NOTE — Progress Notes (Signed)
Subjective:    Patient ID: Debra Norton, female    DOB: August 21, 1956, 59 y.o.   MRN: [id number]  HPI   Pt has 4-5 days of st. Hurts to swallow even own saliva. Some swollen glands neck. Faint body aches. Some dizzy earlier today. But feels better. Mild body aches. Exposed to student who had strep. 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   Review of Systems  Constitutional: Positive for chills and fatigue. Negative for fever.  HENT: Positive for congestion, rhinorrhea and sore throat. Negative for ear pain, sinus pressure, sneezing and trouble swallowing.   Respiratory: Positive for cough.        Mild occasional.  Cardiovascular: Negative for chest pain and palpitations.  Musculoskeletal:       Mild faint achy since Monday.  Neurological: Positive for dizziness. Negative for syncope, facial asymmetry, light-headedness, numbness and headaches.       Dizzy ealier today but now resolved.  Hematological: Positive for adenopathy. Does not bruise/bleed easily.  Psychiatric/Behavioral: Negative for behavioral problems and confusion.   Past Medical History  Diagnosis Date  . Cholelithiasis   . Cervical dysplasia   . Anxiety   . Migraines     History   Social History  . Marital Status: Widowed    Spouse Name: N/A  . Number of Children: 0  . Years of Education: N/A   Occupational History  . desk job    Social History Main Topics  . Smoking status: Former Research scientist (life sciences)  . Smokeless tobacco: Never Used     Comment: quit in high School  . Alcohol Use: 1.0 oz/week    2 drink(s) per week     Comment: socially  . Drug Use: No  . Sexual Activity: Yes    Birth Control/ Protection: Injection   Other Topics Concern  . Not on file   Social History Narrative   Lives with boy friend      G4 P0    miscarriage x 3    ectopic x 1     Past Surgical History  Procedure Laterality Date  . Ectopic pregnancy surgery    . Colposcopy    . Gynecologic  cryosurgery    . External ear surgery      Family History  Problem Relation Age of Onset  . Cancer Mother     colon  . Diabetes Sister   . Ovarian cancer Cousin     Mat. 1st cousin    Allergies  Allergen Reactions  . Diphenhydramine Hcl     REACTION: VERY WIRED/palpitations  Benadryl  . Penicillins     REACTION: HIVES  . Tetanus Toxoid     REACTION: REALLY BAD SWELLING  . Hydrocodone     Insomnia, itching (mild)    Current Outpatient Prescriptions on File Prior to Visit  Medication Sig Dispense Refill  . ALPRAZolam (XANAX) 0.25 MG tablet TAKE 1 TABLET BY MOUTH AT BEDTIME AS NEEDED FOR SLEEP 30 tablet 1  . meloxicam (MOBIC) 15 MG tablet Take 1 tablet (15 mg total) by mouth daily as needed for pain. 30 tablet 2   No current facility-administered medications on file prior to visit.    BP 130/70 mmHg  Pulse 101  Temp(Src) 98.9 F (37.2 C) (Oral)  Ht 5\' 2"  (1.575 m)  Wt 147 lb 12.8 oz (67.042 kg)  BMI 27.03 kg/m2  SpO2 97%      Objective:   Physical Exam  General- No  acute distress, pleasant pt.  Neck- from, No nuccal rigidity, Mild lt submandibular node hypertrophy   Lungs- Clear even and unlabored.  Heart- Regular, rate and rhythm. HEENT- Head- normocephalic Eyes- PEERL bilaterally. Ears- Canals clear, normal tm's bilaterally. Nose- No frontal or maxillary sinus tenderness to palpation. Turbinates normal. Throat- posterior pharynx shows  2+  tonsillar hypertrophy plus,  Bright  erythma,  No discharge.   Neurologic- CN III- XII grossly intact.No gross motor or sensory function deficits.      Assessment & Plan:

## 2015-03-08 ENCOUNTER — Telehealth: Payer: Self-pay | Admitting: Internal Medicine

## 2015-03-08 NOTE — Telephone Encounter (Signed)
Caller name: Hagan, Maltz Relation to pt: self  Call back number: [phone] Pharmacy: [id number] - [place], [place] [phone] (Phone) [phone] (Fax)        Reason for call: pt was last seen  Chest congestion and pt states she does not feel 100 percent requesting another RX. Please advise

## 2015-03-09 ENCOUNTER — Telehealth: Payer: Self-pay | Admitting: Medical

## 2015-03-09 NOTE — Telephone Encounter (Signed)
Called to follow-up with patient and recommend she go to ED.  Patient stated she does not feel that bad and requested antibiotic.  Notified patient that chest pressure is not typical symptom of bronchitis, so our recommendation was for her to go to ED to be evaluated.  Patient refused but thanked for advice.

## 2015-03-09 NOTE — Telephone Encounter (Signed)
Patient returned call.  She is stating that she feels "pressure" in her chest "like my dog is sitting on my chest- he is not very big."  She denies shortness of breath except with heavy activity.  She denies pain in her chest and states that it does not radiate.  She is not coughing frequently, but the coughing does make it worse.  She feels that it is hard to take a deep breath.  She denies palpitations, but does report weakness.  No fevers or dizziness.   Notified patient that if she does feel dizzy, short of breath, or chest pain increases or radiates to go to ED.  She stated understanding and agrees.  She is requesting another antibiotic.   Please advise.

## 2015-03-09 NOTE — Telephone Encounter (Signed)
FYI

## 2015-03-09 NOTE — Telephone Encounter (Signed)
Pt has chest pressure. Needs to be seen in ED now.

## 2015-03-09 NOTE — Telephone Encounter (Signed)
Called pt and left message on [phone]. Explained not sure other antibiotic needed since zpack usually does work but would consider another antibiotic depending on what is going on. Particularly as we are approaching easter 3 day weekend.

## 2015-04-21 ENCOUNTER — Other Ambulatory Visit: Payer: Self-pay | Admitting: *Deleted

## 2015-04-21 DIAGNOSIS — F411 Generalized anxiety disorder: Secondary | ICD-10-CM

## 2015-04-21 MED ORDER — ALPRAZOLAM 0.25 MG PO TABS: ORAL_TABLET | ORAL | Status: DC

## 2015-04-21 NOTE — Telephone Encounter (Signed)
Rx called in 

## 2015-04-21 NOTE — Telephone Encounter (Signed)
(  You are back up MD) pt annual due in June. Please advise

## 2015-04-21 NOTE — Telephone Encounter (Signed)
Needs annual exam next month

## 2015-04-28 ENCOUNTER — Encounter: Payer: Self-pay | Admitting: Internal Medicine

## 2015-05-10 ENCOUNTER — Other Ambulatory Visit: Payer: Self-pay

## 2015-05-10 ENCOUNTER — Ambulatory Visit (INDEPENDENT_AMBULATORY_CARE_PROVIDER_SITE_OTHER): Admitting: Internal Medicine

## 2015-05-10 ENCOUNTER — Encounter: Payer: Self-pay | Admitting: Internal Medicine

## 2015-05-10 VITALS — BP 100/66 | HR 81 | Temp 98.1°F | Resp 16 | Ht 62.0 in | Wt 140.0 lb

## 2015-05-10 DIAGNOSIS — T7840XA Allergy, unspecified, initial encounter: Secondary | ICD-10-CM | POA: Diagnosis not present

## 2015-05-10 DIAGNOSIS — R42 Dizziness and giddiness: Secondary | ICD-10-CM

## 2015-05-10 MED ORDER — PREDNISONE 10 MG PO TABS: ORAL_TABLET | ORAL | Status: DC

## 2015-05-10 NOTE — Assessment & Plan Note (Signed)
Since the last visit, she saw neurology, EEG was negative, they found no explanation to her symptoms. Plan: PT referral, reassess in few months

## 2015-05-10 NOTE — Progress Notes (Signed)
Pre visit review using our clinic review tool, if applicable. No additional management support is needed unless otherwise documented below in the visit note. 

## 2015-05-10 NOTE — Patient Instructions (Signed)
Take prednisone as prescribed Zyrtec 10 mg OTC daily If symptoms resurface call immediately  Question of allergic reaction to clindamycin, avoid use Avoid coconut  flower

## 2015-05-10 NOTE — Progress Notes (Signed)
Subjective:    Patient ID: Debra Norton, female    DOB: 18-Aug-1956, 59 y.o.   MRN: [id number]  DOS:  05/10/2015 Type of visit - description : acute Interval history: The patient went to urgent care 05/01/2015 with throat infection, she was prescribed clindamycin and she took it until 05/08/2015. (Throat infection improved)  Today she woke up, was cooking, ate a dish w/ coconut flower and sometime later she developed a rash at the abdomen, back, extremities (proximally). She was only itching at the right arm. She had no rash at the hands or feet although they itch a little bit. Symptoms gradually subsided.  Unrelated to this problem, she was seen with dizziness last year, symptoms are ongoing, slightly more frequent?   Review of Systems Denies fever, chills. No throat, lips or tonge swelling today No joint aches   Past Medical History  Diagnosis Date  . Cholelithiasis   . Cervical dysplasia   . Anxiety   . Migraines     Past Surgical History  Procedure Laterality Date  . Ectopic pregnancy surgery    . Colposcopy    . Gynecologic cryosurgery    . External ear surgery      History   Social History  . Marital Status: Widowed    Spouse Name: N/A  . Number of Children: 0  . Years of Education: N/A   Occupational History  . desk job    Social History Main Topics  . Smoking status: Former Research scientist (life sciences)  . Smokeless tobacco: Never Used     Comment: quit in high School  . Alcohol Use: 1.0 oz/week    2 drink(s) per week     Comment: socially  . Drug Use: No  . Sexual Activity: Yes    Birth Control/ Protection: Injection   Other Topics Concern  . Not on file   Social History Narrative   Lives with boy friend      G4 P0    miscarriage x 3    ectopic x 1         Medication List       This list is accurate as of: 05/10/15 11:59 PM.  Always use your most recent med list.               ALPRAZolam 0.25 MG tablet  Commonly known as:  XANAX  TAKE 1  TABLET BY MOUTH AT BEDTIME AS NEEDED FOR SLEEP     meloxicam 15 MG tablet  Commonly known as:  MOBIC  Take 1 tablet (15 mg total) by mouth daily as needed for pain.     predniSONE 10 MG tablet  Commonly known as:  DELTASONE  4 tablets x 2 days, 3 tabs x 2 days, 2 tabs x 2 days, 1 tab x 2 days           Objective:   Physical Exam BP 100/66 mmHg  Pulse 81  Temp(Src) 98.1 F (36.7 C) (Oral)  Resp 16  Ht 5\' 2"  (1.575 m)  Wt 140 lb (63.504 kg)  BMI 25.60 kg/m2  SpO2 99% General:   Well developed, well nourished . NAD.  HEENT:  Normocephalic . Face symmetric, atraumatic. Lips normal, throat symmetric without redness. Lungs:  CTA B Normal respiratory effort, no intercostal retractions, no accessory muscle use. Heart: RRR,  no murmur.  No pretibial edema bilaterally  Skin: Numerous, 3-4 mm, round, pink, slightly elevated lesions at the abdomen, back, extremities (only proximally). Patient reports that the  lesions were more red this morning and this afternoon. Neurologic:  alert & oriented X3.  Speech normal, gait appropriate for age and unassisted Psych--  Cognition and judgment appear intact.  Cooperative with normal attention span and concentration.  Behavior appropriate. No anxious or depressed appearing.        Assessment & Plan:    Recommend to come back to the office at her convenience for a physical See instructions

## 2015-05-11 DIAGNOSIS — T7840XA Allergy, unspecified, initial encounter: Secondary | ICD-10-CM | POA: Insufficient documentation

## 2015-05-11 NOTE — Assessment & Plan Note (Signed)
Allergic reaction, The patient has an allergic reaction in the setting of just finishing a round of clindamycin and also eating an unusual food for her (coconut flower). Time frame of events  indicated that the culprit is probably the coconut flower however given the recent intake of clindamycin an allergic reaction to the abx is also possible . If in the future clinda is needed, it may be reconsider cautiously. Plan:  Avoidance Prednisone Zyrtec

## 2015-05-23 ENCOUNTER — Ambulatory Visit: Attending: Internal Medicine | Admitting: Physical Therapy

## 2015-05-23 DIAGNOSIS — R42 Dizziness and giddiness: Secondary | ICD-10-CM | POA: Insufficient documentation

## 2015-05-23 DIAGNOSIS — R2689 Other abnormalities of gait and mobility: Secondary | ICD-10-CM | POA: Diagnosis not present

## 2015-05-23 NOTE — Patient Instructions (Signed)
Gaze Stabilization: Standing Feet Together (Compliant Surface)   Feet together on pillow, keeping eyes on target on wall __3-5__ feet away, tilt head down 15-30 and move head side to side for __20-30__ seconds. Repeat while moving head up and down for _20-30___ seconds. Do _2-3___ sessions per day. Repeat using target on pattern background.  Copyright  VHI. All rights reserved.   Gaze Stabilization: Tip Card 1.Target must remain in focus, not blurry, and appear stationary while head is in motion. 2.Perform exercises with small head movements (45 to either side of midline). 3.Increase speed of head motion so long as target is in focus. 4.If you wear eyeglasses, be sure you can see target through lens (therapist will give specific instructions for bifocal / progressive lenses). 5.These exercises may provoke dizziness or nausea. Work through these symptoms. If too dizzy, slow head movement slightly. Rest between each exercise. 6.Exercises demand concentration; avoid distractions. 7.For safety, perform standing exercises close to a counter, wall, corner, or next to someone.  Copyright  VHI. All rights reserved.    [facility] [place], [place] [address]  [phone] (office) [phone] (fax)

## 2015-05-23 NOTE — Therapy (Signed)
[facility] [address]  [place], [place], [address] Phone: [phone]   Fax:  [phone]  Physical Therapy Evaluation  Patient Details  Name: Debra Norton MRN: [id number] Date of Birth: 1956/06/10 Referring Provider:  Colon Branch, MD  Encounter Date: 05/23/2015      PT End of Session - 05/23/15 1437    Visit Number 1   Number of Visits 4   Date for PT Re-Evaluation 06/22/15   PT Start Time 5009   PT Stop Time 1438   PT Time Calculation (min) 43 min   Activity Tolerance Patient tolerated treatment well   Behavior During Therapy [facility] for tasks assessed/performed      Past Medical History  Diagnosis Date  . Cholelithiasis   . Cervical dysplasia   . Anxiety   . Migraines     Past Surgical History  Procedure Laterality Date  . Ectopic pregnancy surgery    . Colposcopy    . Gynecologic cryosurgery    . External ear surgery      There were no vitals filed for this visit.  Visit Diagnosis:  Dizziness and giddiness - Plan: PT plan of care cert/re-cert  Imbalance - Plan: PT plan of care cert/re-cert      Subjective Assessment - 05/23/15 1356    Subjective Pt is a 59 y/o female who presents to OPPT for vertigo and imbalance.  Pt reports symptoms unprovoked by positions and reports increase in frequency.  Pt reports symptoms last only seconds.     Patient is accompained by: Family member  boyfriend   Diagnostic tests MRI head: negative   Patient Stated Goals improve dizziness/vertigo   Currently in Pain? No/denies            [facility] PT Assessment - 05/23/15 1358    Assessment   Medical Diagnosis vertigo   Onset Date/Surgical Date --  1-2 years   Next MD Visit PRN   Prior Therapy none   Precautions   Precautions None   Restrictions   Weight Bearing Restrictions No   Balance Screen   Has the patient fallen in the past 6 months No  boyfriend reports he has "caught" pt a few times   Has the patient had a decrease in activity level because of a fear of falling?  No   Is the patient reluctant to leave their home because of a fear of falling?  No   Prior Function   Level of Independence Independent   Cognition   Overall Cognitive Status Within Functional Limits for tasks assessed   Observation/Other Assessments   Focus on Therapeutic Outcomes (FOTO)  97 (3% limited; predicted 5% limited)            Vestibular Assessment - 05/23/15 1400    Symptom Behavior   Type of Dizziness Imbalance   Frequency of Dizziness couple times a day   Duration of Dizziness seconds   Aggravating Factors No known aggravating factors;Spontaneous onset;Turning body quickly   Relieving Factors No known relieving factors   Occulomotor Exam   Occulomotor Alignment Normal   Spontaneous Absent   Gaze-induced Absent   Smooth Pursuits Intact   Saccades Intact   Vestibulo-Occular Reflex   VOR 1 Head Only (x 1 viewing) WNL   Comment head thrust test without symptoms; mild loss of target to L   Other Tests   Comments LOB with EC on foam with feet apart and together after ~ 5  sec standing   Positional Testing   Dix-Hallpike Dix-Hallpike Right;Dix-Hallpike Left   Horizontal Canal Testing Horizontal Canal Right;Horizontal Canal Left   Dix-Hallpike Right   Dix-Hallpike Right Symptoms No nystagmus   Dix-Hallpike Left   Dix-Hallpike Left Duration pt reprots mild lightheadedness   Dix-Hallpike Left Symptoms No nystagmus   Horizontal Canal Right   Horizontal Canal Right Symptoms Normal   Horizontal Canal Left   Horizontal Canal Left Symptoms Normal   Orthostatics   BP supine (x 5 minutes) 112/62 mmHg   HR supine (x 5 minutes) 82   BP standing (after 1 minute) 106/68 mmHg   HR standing (after 1 minute) 103   BP standing (after 3 minutes) 118/78 mmHg   HR standing (after 3 minutes) 101                       PT Education - 06-22-15 1437    Education provided Yes    Education Details HEP   Person(s) Educated Patient   Methods Explanation;Demonstration;Handout   Comprehension Verbalized understanding;Returned demonstration;Need further instruction             PT Long Term Goals - Jun 22, 2015 1441    PT LONG TERM GOAL #1   Title report no episodes of dizziness (06/20/15)   Time 4   Period Weeks   Status New   PT LONG TERM GOAL #2   Title independent with HEP (06/20/15)   Time 4   Period Weeks   Status New   PT LONG TERM GOAL #3   Title stand EC on compliant surface with feet together > 30 seconds for improved vestibular input (06/20/15)   Time 4   Period Weeks   Status New               Plan - 06/22/15 1438    Clinical Impression Statement Pt presents to OPPT with 1-2 year history of imbalance and dizziness.  Pt reports symptoms occur spontaneously and not frequently provoked by head movements or position changes.  Orthostatics negative.  Pt demonstrates mild decreased vestibular input with standing on compliant surface with EC.  Will plan to trial PT x 3 visits to see if symptoms improve.  If no improvement in symptoms recommend follow up with MD re: lab work to rule out chemical imbalance and possibly follow up eye exam.     Pt will benefit from skilled therapeutic intervention in order to improve on the following deficits Decreased balance;Dizziness   Rehab Potential Good   PT Frequency 1x / week   PT Duration 4 weeks   PT Treatment/Interventions ADLs/Self Care Home Management;Canalith Repostioning;Vestibular;Patient/family education;Neuromuscular re-education;Balance training   PT Next Visit Plan review HEP; continue compliant surface activities   Consulted and Agree with Plan of Care Patient          G-Codes - June 22, 2015 1443    Functional Assessment Tool Used FOTO 97   Functional Limitation Self care   Self Care Current Status (K1601) At least 1 percent but less than 20 percent impaired, limited or restricted   Self Care Goal  Status (U9323) At least 1 percent but less than 20 percent impaired, limited or restricted       Problem List Patient Active Problem List   Diagnosis Date Noted  . Allergic reaction 05/11/2015  . Acute pharyngitis 03/03/2015  . Dizziness and giddiness 05/24/2014  . OSA (obstructive sleep apnea) 05/24/2014  . Cervical dysplasia   . Allergic rhinitis 08/28/2011  .  ELBOW PAIN 12/19/2010  . DEPRESSION 07/05/2010  . GERD 07/05/2010  . COLONIC POLYPS, ADENOMATOUS, HX OF 07/05/2010  . INSOMNIA 06/03/2007  . PALPITATIONS 06/03/2007  . MIGRAINE HEADACHE 05/08/2007  . COLONOSCOPY, HX OF 05/08/2007   Laureen Abrahams, PT, DPT 05/23/2015 2:44 PM  [facility] [address]  [address] [place], [place], [address] Phone: [phone]   Fax:  [phone]

## 2015-05-30 ENCOUNTER — Ambulatory Visit: Admitting: Physical Therapy

## 2015-05-30 DIAGNOSIS — R42 Dizziness and giddiness: Secondary | ICD-10-CM | POA: Diagnosis not present

## 2015-05-30 DIAGNOSIS — R2689 Other abnormalities of gait and mobility: Secondary | ICD-10-CM

## 2015-05-30 NOTE — Therapy (Signed)
[facility] [address]  [place], [place], [address] Phone: [phone]   Fax:  [phone]  Physical Therapy Treatment  Patient Details  Name: Debra Norton MRN: [id number] Date of Birth: [birth year] Referring Provider:  Colon Branch, MD  Encounter Date: 05/30/2015      PT End of Session - 05/30/15 1438    Visit Number 2   Number of Visits 4   Date for PT Re-Evaluation 06/22/15   PT Start Time 1400   PT Stop Time 1438   PT Time Calculation (min) 38 min   Activity Tolerance Patient tolerated treatment well   Behavior During Therapy [facility] for tasks assessed/performed      Past Medical History  Diagnosis Date  . Cholelithiasis   . Cervical dysplasia   . Anxiety   . Migraines     Past Surgical History  Procedure Laterality Date  . Ectopic pregnancy surgery    . Colposcopy    . Gynecologic cryosurgery    . External ear surgery      There were no vitals filed for this visit.  Visit Diagnosis:  Dizziness and giddiness  Imbalance      Subjective Assessment - 05/30/15 1402    Subjective episodes seem to be fewer; doing exercises "some."  reports she has been "going, going, going" and not standing still as often. has noticed a sound with horizontal gaze "lise someone's picking a metal string."   Diagnostic tests MRI head: negative   Patient Stated Goals improve dizziness/vertigo   Currently in Pain? No/denies                          Vestibular Treatment/Exercise - 05/30/15 1404    Vestibular Treatment/Exercise   Gaze Exercises X1 Viewing Horizontal;X1 Viewing Vertical   X1 Viewing Horizontal   Foot Position together on compliant surface   Time --  30 sec   Reps 1   Comments min cues for technique   X1 Viewing Vertical   Foot Position together on compliant surface   Time --  30 sec   Reps 1   Comments min cues for technique            Balance Exercises -  05/30/15 1413    Balance Exercises: Standing   Standing Eyes Closed Narrow base of support (BOS);Head turns  x 10 each direction   Gait with Head Turns Forward;Other (comment)  with and without visual distractions (ball)   Tandem Gait Forward;Retro;Foam/compliant surface;5 reps   Partial Tandem Stance Eyes closed;Foam/compliant surface;5 reps;10 secs  each position; EO with horizontal/vertical head turns   Sidestepping Foam/compliant support;5 reps   Other Standing Exercises resistive walking with perterbations; pt able to maintain balance                PT Long Term Goals - 05/30/15 1440    PT LONG TERM GOAL #1   Title report no episodes of dizziness (06/20/15)   Status On-going   PT LONG TERM GOAL #2   Title independent with HEP (06/20/15)   Status On-going   PT LONG TERM GOAL #3   Title stand EC on compliant surface with feet together > 30 seconds for improved vestibular input (06/20/15)   Status On-going               Plan - 05/30/15 1438    Clinical Impression Statement Pt demonstrates some mild instabilities with  vestibular exercises both static standing and with gait activities.  Pt able to self correct without PT assistance.  Will continue to benefit from PT to decrease vertigo and improve balance.   PT Next Visit Plan add corner balance exercises to HEP; compliant and dynamic activities   Consulted and Agree with Plan of Care Patient        Problem List Patient Active Problem List   Diagnosis Date Noted  . Allergic reaction 05/11/2015  . Acute pharyngitis 03/03/2015  . Dizziness and giddiness 05/24/2014  . OSA (obstructive sleep apnea) 05/24/2014  . Cervical dysplasia   . Allergic rhinitis 08/28/2011  . ELBOW PAIN 12/19/2010  . DEPRESSION 07/05/2010  . GERD 07/05/2010  . COLONIC POLYPS, ADENOMATOUS, HX OF 07/05/2010  . INSOMNIA 06/03/2007  . PALPITATIONS 06/03/2007  . MIGRAINE HEADACHE 05/08/2007  . COLONOSCOPY, HX OF 05/08/2007   Laureen Abrahams, PT, DPT 05/30/2015 2:40 PM  [facility] [address]  [place], [place], [address] Phone: [phone]   Fax:  [phone]

## 2015-06-06 ENCOUNTER — Ambulatory Visit

## 2015-06-07 ENCOUNTER — Other Ambulatory Visit (HOSPITAL_COMMUNITY)
Admission: RE | Admit: 2015-06-07 | Discharge: 2015-06-07 | Disposition: A | Discharge status: Home / Self Care | Source: Ambulatory Visit | Attending: Women's Health | Admitting: Women's Health

## 2015-06-07 ENCOUNTER — Ambulatory Visit (INDEPENDENT_AMBULATORY_CARE_PROVIDER_SITE_OTHER): Admitting: Women's Health

## 2015-06-07 ENCOUNTER — Encounter: Payer: Self-pay | Admitting: Women's Health

## 2015-06-07 VITALS — BP 110/74 | Ht 62.0 in | Wt 143.0 lb

## 2015-06-07 DIAGNOSIS — Z1151 Encounter for screening for human papillomavirus (HPV): Secondary | ICD-10-CM | POA: Insufficient documentation

## 2015-06-07 DIAGNOSIS — Z01419 Encounter for gynecological examination (general) (routine) without abnormal findings: Secondary | ICD-10-CM | POA: Insufficient documentation

## 2015-06-07 DIAGNOSIS — Z13220 Encounter for screening for lipoid disorders: Secondary | ICD-10-CM

## 2015-06-07 LAB — TSH: TSH: 2.579 u[IU]/mL (ref 0.350–4.500)

## 2015-06-07 LAB — COMPREHENSIVE METABOLIC PANEL
ALBUMIN: 4 g/dL (ref 3.5–5.2)
ALT: 23 U/L (ref 0–35)
AST: 18 U/L (ref 0–37)
Alkaline Phosphatase: 67 U/L (ref 39–117)
BUN: 13 mg/dL (ref 6–23)
CO2: 24 mEq/L (ref 19–32)
Calcium: 9.4 mg/dL (ref 8.4–10.5)
Chloride: 109 mEq/L (ref 96–112)
Creat: 0.78 mg/dL (ref 0.50–1.10)
GLUCOSE: 85 mg/dL (ref 70–99)
Potassium: 4.2 mEq/L (ref 3.5–5.3)
SODIUM: 141 meq/L (ref 135–145)
TOTAL PROTEIN: 6.2 g/dL (ref 6.0–8.3)
Total Bilirubin: 0.4 mg/dL (ref 0.2–1.2)

## 2015-06-07 LAB — CBC WITH DIFFERENTIAL/PLATELET
BASOS PCT: 1 % (ref 0–1)
Basophils Absolute: 0.1 10*3/uL (ref 0.0–0.1)
EOS PCT: 2 % (ref 0–5)
Eosinophils Absolute: 0.1 10*3/uL (ref 0.0–0.7)
HCT: 39.3 % (ref 36.0–46.0)
Hemoglobin: 13.3 g/dL (ref 12.0–15.0)
Lymphocytes Relative: 37 % (ref 12–46)
Lymphs Abs: 2.2 10*3/uL (ref 0.7–4.0)
MCH: 30.2 pg (ref 26.0–34.0)
MCHC: 33.8 g/dL (ref 30.0–36.0)
MCV: 89.1 fL (ref 78.0–100.0)
MONO ABS: 0.5 10*3/uL (ref 0.1–1.0)
MPV: 11.9 fL (ref 8.6–12.4)
Monocytes Relative: 8 % (ref 3–12)
Neutro Abs: 3.1 10*3/uL (ref 1.7–7.7)
Neutrophils Relative %: 52 % (ref 43–77)
Platelets: 303 10*3/uL (ref 150–400)
RBC: 4.41 MIL/uL (ref 3.87–5.11)
RDW: 13.6 % (ref 11.5–15.5)
WBC: 5.9 10*3/uL (ref 4.0–10.5)

## 2015-06-07 LAB — LIPID PANEL
Cholesterol: 191 mg/dL (ref 0–200)
HDL: 47 mg/dL (ref >=46)
LDL Cholesterol: 107 mg/dL — ABNORMAL HIGH (ref 0–99)
Total CHOL/HDL Ratio: 4.1 Ratio
Triglycerides: 184 mg/dL — ABNORMAL HIGH (ref <150)
VLDL: 37 mg/dL (ref 0–40)

## 2015-06-07 NOTE — Progress Notes (Signed)
Debra Norton May 25, 1956 [id number]    History:    Presents for annual exam.  Postmenopausal no bleeding greater than one year on no HRT. Normal Pap  history. 02/2015 mammogram normal after ultrasound left breast fibroadenoma noted. Normal DEXA history. 2011 benign colon polyp, mother history of colon cancer.  Past medical history, past surgical history, family history and social history were all reviewed and documented in the EPIC chart. Works at [employer]. Long-term boyfriend has 34 year old son who is 50% of the time with them. Has had a prominent the past with dizziness and imbalance seeing a neurologist. Increased headaches in the past year.  ROS:  A ROS was performed and pertinent positives and negatives are included.  Exam:  Filed Vitals:   06/07/15 1131  BP: 110/74    General appearance:  Normal Thyroid:  Symmetrical, normal in size, without palpable masses or nodularity. Respiratory  Auscultation:  Clear without wheezing or rhonchi Cardiovascular  Auscultation:  Regular rate, without rubs, murmurs or gallops  Edema/varicosities:  Not grossly evident Abdominal  Soft,nontender, without masses, guarding or rebound.  Liver/spleen:  No organomegaly noted  Hernia:  None appreciated  Skin  Inspection:  Grossly normal   Breasts: Examined lying and sitting.     Right: Without masses, retractions, discharge or axillary adenopathy.     Left: Without masses, retractions, discharge or axillary adenopathy. Gentitourinary   Inguinal/mons:  Normal without inguinal adenopathy  External genitalia:  Normal  BUS/Urethra/Skene's glands:  Normal  Vagina:  Normal  Cervix:  Normal  Uterus:  normal in size, shape and contour.  Midline and mobile  Adnexa/parametria:     Rt: Without masses or tenderness.   Lt: Without masses or tenderness.  Anus and perineum: Normal  Digital rectal exam: Normal sphincter tone without palpated masses or tenderness  Assessment/Plan:  60  y.o. DWF G0 for annual exam.   Postmenopausal/no bleeding greater than one year Increased headaches history of migraines Vertigo-neurologist manages  Plan: Instructed to schedule follow-up colonoscopy. SBE's, continue annual screening mammogram 3-D tomography reviewed and encouraged. Regular exercise, calcium rich diet, vitamin D 1000 daily encouraged. CBC, lipid panel, TSH, CMP, vitamin D, UA, Pap with HR HPV typing, new screening guidelines reviewed. DEXA next year. Encouraged her to discuss increased headaches with neurologist at follow-up appointment.   [facility], 12:07 PM 06/07/2015

## 2015-06-07 NOTE — Patient Instructions (Signed)
Health Recommendations for Postmenopausal Women Respected and ongoing research has looked at the most common causes of death, disability, and poor quality of life in postmenopausal women. The causes include heart disease, diseases of blood vessels, diabetes, depression, cancer, and bone loss (osteoporosis). Many things can be done to help lower the chances of developing these and other common problems. CARDIOVASCULAR DISEASE Heart Disease: A heart attack is a medical emergency. Know the signs and symptoms of a heart attack. Below are things women can do to reduce their risk for heart disease.   Do not smoke. If you smoke, quit.  Aim for a healthy weight. Being overweight causes many preventable deaths. Eat a healthy and balanced diet and drink an adequate amount of liquids.  Get moving. Make a commitment to be more physically active. Aim for 30 minutes of activity on most, if not all days of the week.  Eat for heart health. Choose a diet that is low in saturated fat and cholesterol and eliminate trans fat. Include whole grains, vegetables, and fruits. Read and understand the labels on food containers before buying.  Know your numbers. Ask your caregiver to check your blood pressure, cholesterol (total, HDL, LDL, triglycerides) and blood glucose. Work with your caregiver on improving your entire clinical picture.  High blood pressure. Limit or stop your table salt intake (try salt substitute and food seasonings). Avoid salty foods and drinks. Read labels on food containers before buying. Eating well and exercising can help control high blood pressure. STROKE  Stroke is a medical emergency. Stroke may be the result of a blood clot in a blood vessel in the brain or by a brain hemorrhage (bleeding). Know the signs and symptoms of a stroke. To lower the risk of developing a stroke:  Avoid fatty foods.  Quit smoking.  Control your diabetes, blood pressure, and irregular heart rate. THROMBOPHLEBITIS  (BLOOD CLOT) OF THE LEG  Becoming overweight and leading a stationary lifestyle may also contribute to developing blood clots. Controlling your diet and exercising will help lower the risk of developing blood clots. CANCER SCREENING  Breast Cancer: Take steps to reduce your risk of breast cancer.  You should practice "breast self-awareness." This means understanding the normal appearance and feel of your breasts and should include breast self-examination. Any changes detected, no matter how small, should be reported to your caregiver.  After age 4, you should have a clinical breast exam (CBE) every year.  Starting at age 48, you should consider having a mammogram (breast X-ray) every year.  If you have a family history of breast cancer, talk to your caregiver about genetic screening.  If you are at high risk for breast cancer, talk to your caregiver about having an MRI and a mammogram every year.  Intestinal or Stomach Cancer: Tests to consider are a rectal exam, fecal occult blood, sigmoidoscopy, and colonoscopy. Women who are high risk may need to be screened at an earlier age and more often.  Cervical Cancer:  Beginning at age 72, you should have a Pap test every 3 years as long as the past 3 Pap tests have been normal.  If you have had past treatment for cervical cancer or a condition that could lead to cancer, you need Pap tests and screening for cancer for at least 20 years after your treatment.  If you had a hysterectomy for a problem that was not cancer or a condition that could lead to cancer, then you no longer need Pap tests.  If you are between ages 65 and 70, and you have had normal Pap tests going back 10 years, you no longer need Pap tests.  If Pap tests have been discontinued, risk factors (such as a new sexual partner) need to be reassessed to determine if screening should be resumed.  Some medical problems can increase the chance of getting cervical cancer. In these  cases, your caregiver may recommend more frequent screening and Pap tests.  Uterine Cancer: If you have vaginal bleeding after reaching menopause, you should notify your caregiver.  Ovarian Cancer: Other than yearly pelvic exams, there are no reliable tests available to screen for ovarian cancer at this time except for yearly pelvic exams.  Lung Cancer: Yearly chest X-rays can detect lung cancer and should be done on high risk women, such as cigarette smokers and women with chronic lung disease (emphysema).  Skin Cancer: A complete body skin exam should be done at your yearly examination. Avoid overexposure to the sun and ultraviolet light lamps. Use a strong sun block cream when in the sun. All of these things are important for lowering the risk of skin cancer. MENOPAUSE Menopause Symptoms: Hormone therapy products are effective for treating symptoms associated with menopause:  Moderate to severe hot flashes.  Night sweats.  Mood swings.  Headaches.  Tiredness.  Loss of sex drive.  Insomnia.  Other symptoms. Hormone replacement carries certain risks, especially in older women. Women who use or are thinking about using estrogen or estrogen with progestin treatments should discuss that with their caregiver. Your caregiver will help you understand the benefits and risks. The ideal dose of hormone replacement therapy is not known. The Food and Drug Administration (FDA) has concluded that hormone therapy should be used only at the lowest doses and for the shortest amount of time to reach treatment goals.  OSTEOPOROSIS Protecting Against Bone Loss and Preventing Fracture If you use hormone therapy for prevention of bone loss (osteoporosis), the risks for bone loss must outweigh the risk of the therapy. Ask your caregiver about other medications known to be safe and effective for preventing bone loss and fractures. To guard against bone loss or fractures, the following is recommended:  If  you are younger than age 50, take 1000 mg of calcium and at least 600 mg of Vitamin D per day.  If you are older than age 50 but younger than age 70, take 1200 mg of calcium and at least 600 mg of Vitamin D per day.  If you are older than age 70, take 1200 mg of calcium and at least 800 mg of Vitamin D per day. Smoking and excessive alcohol intake increases the risk of osteoporosis. Eat foods rich in calcium and vitamin D and do weight bearing exercises several times a week as your caregiver suggests. DIABETES Diabetes Mellitus: If you have type I or type 2 diabetes, you should keep your blood sugar under control with diet, exercise, and recommended medication. Avoid starchy and fatty foods, and too many sweets. Being overweight can make diabetes control more difficult. COGNITION AND MEMORY Cognition and Memory: Menopausal hormone therapy is not recommended for the prevention of cognitive disorders such as Alzheimer's disease or memory loss.  DEPRESSION  Depression may occur at any age, but it is common in elderly women. This may be because of physical, medical, social (loneliness), or financial problems and needs. If you are experiencing depression because of medical problems and control of symptoms, talk to your caregiver about this. Physical   activity and exercise may help with mood and sleep. Community and volunteer involvement may improve your sense of value and worth. If you have depression and you feel that the problem is getting worse or becoming severe, talk to your caregiver about which treatment options are best for you. ACCIDENTS  Accidents are common and can be serious in elderly woman. Prepare your house to prevent accidents. Eliminate throw rugs, place hand bars in bath, shower, and toilet areas. Avoid wearing high heeled shoes or walking on wet, snowy, and icy areas. Limit or stop driving if you have vision or hearing problems, or if you feel you are unsteady with your movements and  reflexes. HEPATITIS C Hepatitis C is a type of viral infection affecting the liver. It is spread mainly through contact with blood from an infected person. It can be treated, but if left untreated, it can lead to severe liver damage over the years. Many people who are infected do not know that the virus is in their blood. If you are a "baby-boomer", it is recommended that you have one screening test for Hepatitis C. IMMUNIZATIONS  Several immunizations are important to consider having during your senior years, including:   Tetanus, diphtheria, and pertussis booster shot.  Influenza every year before the flu season begins.  Pneumonia vaccine.  Shingles vaccine.  Others, as indicated based on your specific needs. Talk to your caregiver about these. Document Released: 01/24/2006 Document Revised: 04/18/2014 Document Reviewed: 09/19/2008 ExitCare Patient Information 2015 [employer]. This information is not intended to replace advice given to you by your health care provider. Make sure you discuss any questions you have with your health care provider. Exercise to Stay Healthy Exercise helps you become and stay healthy. EXERCISE IDEAS AND TIPS Choose exercises that:  You enjoy.  Fit into your day. You do not need to exercise really hard to be healthy. You can do exercises at a slow or medium level and stay healthy. You can:  Stretch before and after working out.  Try yoga, Pilates, or tai chi.  Lift weights.  Walk fast, swim, jog, run, climb stairs, bicycle, dance, or rollerskate.  Take aerobic classes. Exercises that burn about 150 calories:  Running 1  miles in 15 minutes.  Playing volleyball for 45 to 60 minutes.  Washing and waxing a car for 45 to 60 minutes.  Playing touch football for 45 minutes.  Walking 1  miles in 35 minutes.  Pushing a stroller 1  miles in 30 minutes.  Playing basketball for 30 minutes.  Raking leaves for 30 minutes.  Bicycling 5  miles in 30 minutes.  Walking 2 miles in 30 minutes.  Dancing for 30 minutes.  Shoveling snow for 15 minutes.  Swimming laps for 20 minutes.  Walking up stairs for 15 minutes.  Bicycling 4 miles in 15 minutes.  Gardening for 30 to 45 minutes.  Jumping rope for 15 minutes.  Washing windows or floors for 45 to 60 minutes. Document Released: 01/04/2011 Document Revised: 02/24/2012 Document Reviewed: 01/04/2011 ExitCare Patient Information 2015 [employer]. This information is not intended to replace advice given to you by your health care provider. Make sure you discuss any questions you have with your health care provider.  

## 2015-06-08 ENCOUNTER — Telehealth: Payer: Self-pay | Admitting: Internal Medicine

## 2015-06-08 LAB — VITAMIN D 25 HYDROXY (VIT D DEFICIENCY, FRACTURES): Vit D, 25-Hydroxy: 29 ng/mL — ABNORMAL LOW (ref 30–100)

## 2015-06-08 LAB — CYTOLOGY - PAP

## 2015-06-08 NOTE — Telephone Encounter (Signed)
pre visit letter mailed 06/07/15

## 2015-06-13 ENCOUNTER — Ambulatory Visit: Admitting: Physical Therapy

## 2015-06-13 DIAGNOSIS — R2689 Other abnormalities of gait and mobility: Secondary | ICD-10-CM

## 2015-06-13 DIAGNOSIS — R42 Dizziness and giddiness: Secondary | ICD-10-CM

## 2015-06-13 NOTE — Patient Instructions (Signed)
Feet Together (Compliant Surface) Head Motion - Eyes Closed   Stand on compliant surface: _pillow__ with feet together. Close eyes and move head slowly, up and down 10 times.  Repeat turning head slowly side to side 10 times.  Repeat _1-2___ times per session. Do __1-3__ sessions per day.  Copyright  VHI. All rights reserved.   Feet Partial Heel-Toe (Compliant Surface) Varied Arm Positions - Eyes Closed   Stand on compliant surface: _pillow__ with right foot partially in front of the other and arms out. Close eyes and stand still. Hold__10__ seconds. Repeat __5__ times per session. Repeat with left foot partially in front.  Do __1-3__ sessions per day.  Copyright  VHI. All rights reserved.   Laureen Abrahams, PT, DPT 06/13/2015 2:19 PM  [facility] at [facility] [place], [place] [address]  [phone] (office) [phone] (fax)

## 2015-06-13 NOTE — Therapy (Signed)
[facility] [address]  [place], [place], [address] Phone: [phone]   Fax:  [phone]  Physical Therapy Treatment  Patient Details  Name: Debra Norton MRN: [id number] Date of Birth: 1956/04/26 Referring Provider:  Colon Branch, MD  Encounter Date: 06/13/2015      PT End of Session - 06/13/15 1423    Visit Number 3   Number of Visits 4   Date for PT Re-Evaluation 06/22/15   PT Start Time 1400   PT Stop Time 1423   PT Time Calculation (min) 23 min   Activity Tolerance Patient tolerated treatment well   Behavior During Therapy [facility] for tasks assessed/performed      Past Medical History  Diagnosis Date  . Cholelithiasis   . Cervical dysplasia   . Anxiety   . Migraines     Past Surgical History  Procedure Laterality Date  . Ectopic pregnancy surgery    . Colposcopy    . Gynecologic cryosurgery    . External ear surgery      There were no vitals filed for this visit.  Visit Diagnosis:  Dizziness and giddiness  Imbalance      Subjective Assessment - 06/13/15 1402    Subjective seems worse the last couple days; but has also increased activity and climbing up/down ladders.  wants to wrap up today as she has a lot going on and hasn't been able to be as compliant with exercises.     Patient Stated Goals improve dizziness/vertigo   Currently in Pain? No/denies            [facility] PT Assessment - 06/13/15 1429    Observation/Other Assessments   Focus on Therapeutic Outcomes (FOTO)  97 (3% limited)                      Vestibular Treatment/Exercise - 06/13/15 1428    Vestibular Treatment/Exercise   Gaze Exercises X1 Viewing Horizontal;X1 Viewing Vertical   X1 Viewing Horizontal   Foot Position together on compliant surface   Time --  30 sec   Reps 1   Comments min cues for technique   X1 Viewing Vertical   Foot Position together on compliant surface   Time --  30  sec   Reps 1   Comments min cues for technique            Balance Exercises - 06/13/15 1410    Balance Exercises: Standing   Standing Eyes Closed Narrow base of support (BOS);Head turns  x10 each dir   Partial Tandem Stance Eyes closed;Foam/compliant surface;5 reps;10 secs  each foot position           PT Education - 06/13/15 1422    Education provided Yes   Education Details HEP; need to schedule appt with optimetrist to assess vision and if any changes have occurred.     Person(s) Educated Patient   Methods Explanation;Demonstration;Handout   Comprehension Verbalized understanding;Returned demonstration             PT Long Term Goals - 06/13/15 1427    PT LONG TERM GOAL #1   Title report no episodes of dizziness (06/20/15)   Status Not Met   PT LONG TERM GOAL #2   Title independent with HEP (06/20/15)   Status Achieved   PT LONG TERM GOAL #3   Title stand EC on compliant surface with feet together > 30 seconds for improved vestibular  input (06/20/15)   Status Not Met               Plan - 06/19/15 1423    Clinical Impression Statement Pt requesting d/c at this time stating she has a lot going on and isn't able to fully dedicate time to therapy and home program.  Added additional exercises and recommended pt return to PT once compliant with HEP and if symptoms do not improve.  Pt verbalized understanding.  Recommended pt have vision assessed as well as symptoms occasionally seem associated with depth perception.   PT Next Visit Plan d/c PT per pt request          G-Codes - 19-Jun-2015 1428    Functional Assessment Tool Used FOTO 97   Functional Limitation Self care   Self Care Goal Status [phone]) At least 1 percent but less than 20 percent impaired, limited or restricted   Self Care Discharge Status [phone]) At least 1 percent but less than 20 percent impaired, limited or restricted      Problem List Patient Active Problem List   Diagnosis Date Noted   . Allergic reaction 05/11/2015  . Acute pharyngitis 03/03/2015  . Dizziness and giddiness 05/24/2014  . OSA (obstructive sleep apnea) 05/24/2014  . Cervical dysplasia   . Allergic rhinitis 08/28/2011  . ELBOW PAIN 12/19/2010  . DEPRESSION 07/05/2010  . GERD 07/05/2010  . COLONIC POLYPS, ADENOMATOUS, HX OF 07/05/2010  . INSOMNIA 06/03/2007  . PALPITATIONS 06/03/2007  . MIGRAINE HEADACHE 05/08/2007  . COLONOSCOPY, HX OF 05/08/2007    Siri Cole 2015-06-19, 2:29 PM  [facility] [address]  [place], [place], [address] Phone: [phone]   Fax:  [phone]       PHYSICAL THERAPY DISCHARGE SUMMARY  Visits from Start of Care: 3  Current functional level related to goals / functional outcomes: See above; goals partially met.  Pt requesting d/c at this time.   Remaining deficits: See above   Education / Equipment: HEP  Plan: Patient agrees to discharge.  Patient goals were partially met. Patient is being discharged due to the patient's request.  ?????    Laureen Abrahams, PT, DPT 19-Jun-2015 2:30 PM   [facility] at [facility] [place], [place] [address]  [phone] (office) [phone] (fax)

## 2015-06-27 ENCOUNTER — Telehealth: Payer: Self-pay | Admitting: *Deleted

## 2015-06-27 ENCOUNTER — Encounter: Payer: Self-pay | Admitting: *Deleted

## 2015-06-27 NOTE — Telephone Encounter (Signed)
Pre-Visit Call completed with patient and chart updated.   Pre-Visit Info documented in Specialty Comments under SnapShot.    

## 2015-06-28 ENCOUNTER — Ambulatory Visit (INDEPENDENT_AMBULATORY_CARE_PROVIDER_SITE_OTHER): Admitting: Internal Medicine

## 2015-06-28 ENCOUNTER — Encounter: Payer: Self-pay | Admitting: Internal Medicine

## 2015-06-28 VITALS — BP 118/62 | HR 96 | Temp 98.4°F | Ht 62.0 in | Wt 143.1 lb

## 2015-06-28 DIAGNOSIS — Z0000 Encounter for general adult medical examination without abnormal findings: Secondary | ICD-10-CM

## 2015-06-28 NOTE — Patient Instructions (Signed)
Please go to the lab for a urine sample: UDS  Take a OTC vitamin D 1000 units daily  Use your carpal tunnel braces every night, see her orthopedic doctor if not better  Be sure you have your follow-up mammograms

## 2015-06-28 NOTE — Progress Notes (Signed)
Subjective:    Patient ID: Debra Norton, female    DOB: 1956-11-07, 59 y.o.   MRN: [id number]  DOS:  06/28/2015 Type of visit - description : CPX Interval history: Had labs recently, they were reviewed   Review of Systems  Constitutional: No fever. No chills. No unexplained wt changes. No unusual sweats. Occasional fatigue  HEENT: No dental problems, no ear discharge, no facial swelling, no voice changes. No eye discharge, no eye  redness , no  intolerance to light   Respiratory: No wheezing , no  difficulty breathing. No cough , no mucus production  Cardiovascular: No CP, no leg swelling , no  Palpitations  GI: no nausea, no vomiting, no diarrhea , no  abdominal pain.  No blood in the stools. No dysphagia, no odynophagia    Endocrine: No polyphagia, no polyuria , no polydipsia  GU: No dysuria, gross hematuria, difficulty urinating. No urinary urgency, no frequency.  Musculoskeletal:  Bilateral hand numbness for the last few days, history of carpal tunnel syndrome and tennis elbow. Sometimes she wakes up by the problem. Also  left foot pain, at the external side, feels like a soreness. No recent injury, foot swelling, no neck pain.  Skin: No change in the color of the skin, palor , no  Rash  Allergic, immunologic: No environmental allergies , no  food allergies  Neurological: No dizziness no  syncope. No headaches. No diplopia, no slurred, no slurred speech, no motor deficits, no facial  Numbness  Hematological: No enlarged lymph nodes, no easy bruising , no unusual bleedings  Psychiatry: No suicidal ideas, no hallucinations, no beavior problems, no confusion.  No unusual/severe anxiety, no depression   Past Medical History  Diagnosis Date  . Cholelithiasis   . Cervical dysplasia   . Anxiety   . Migraines     Past Surgical History  Procedure Laterality Date  . Ectopic pregnancy surgery    . Colposcopy    . Gynecologic cryosurgery    . External ear  surgery      History   Social History  . Marital Status: Widowed    Spouse Name: N/A  . Number of Children: 0  . Years of Education: N/A   Occupational History  . active at work    Social History Main Topics  . Smoking status: Former Research scientist (life sciences)  . Smokeless tobacco: Never Used     Comment: quit in high School  . Alcohol Use: 1.2 oz/week    2 Standard drinks or equivalent per week     Comment: socially  . Drug Use: No  . Sexual Activity: Yes    Birth Control/ Protection:      Comment: INTERCOURSE AGE 28, SEXUAL PARTNERS  MORE THAN 5   Other Topics Concern  . Not on file   Social History Narrative   Lives with boy friend      G4 P0    miscarriage x 3    ectopic x 1      Family History  Problem Relation Age of Onset  . Colon cancer Mother     in her 61s  . Diabetes Sister   . Ovarian cancer Cousin     Mat. 1st cousin  . Breast cancer Neg Hx   . CAD Neg Hx        Medication List       This list is accurate as of: 06/28/15 11:59 PM.  Always use your most recent med list.  ALPRAZolam 0.25 MG tablet  Commonly known as:  XANAX  TAKE 1 TABLET BY MOUTH AT BEDTIME AS NEEDED FOR SLEEP     meloxicam 15 MG tablet  Commonly known as:  MOBIC  Take 1 tablet (15 mg total) by mouth daily as needed for pain.           Objective:   Physical Exam  Skin:      BP 118/62 mmHg  Pulse 96  Temp(Src) 98.4 F (36.9 C) (Oral)  Ht 5\' 2"  (1.575 m)  Wt 143 lb 2 oz (64.921 kg)  BMI 26.17 kg/m2  SpO2 96% General:   Well developed, well nourished . NAD.  Neck:  Full range of motion. Supple. No  thyromegaly , normal carotid pulse HEENT:  Normocephalic . Face symmetric, atraumatic Lungs:  CTA B Normal respiratory effort, no intercostal retractions, no accessory muscle use. Heart: RRR,  no murmur.  No pretibial edema bilaterally  Abdomen:  Not distended, soft, non-tender. No rebound or rigidity. No mass,organomegaly Skin: Exposed areas without rash.  Not pale. Not jaundice. Neurologic:  alert & oriented X3.  Speech normal, gait appropriate for age and unassisted Strength symmetric and appropriate for age.  Psych: Cognition and judgment appear intact.  Cooperative with normal attention span and concentration.  Behavior appropriate. No anxious or depressed appearing.       Assessment & Plan:

## 2015-06-28 NOTE — Assessment & Plan Note (Addendum)
Tdap--allergic Female care  Pap-- 06/07/15 with Elon Alas, NP- negative  MMG-- 02/23/15 with Elige Radon, MD at [facility]- BI-RADS 0- [place] completed same day and result was BI-RADS 2: Benign, follow-up recommended in 4 months  CCS-- 07/03/10 with Delfin Edis, MD- benign polyps removed, follow-up recommended in 3 years (recall letter already sent) . Letter reprinted, rec to get that set up Diet and exercise discussed Labs: Reviewed, all very good except for vitamin D which is a slightly low. Recommend vitamin D supplements, patient request a check M01 and folic acid which will be done.  Other issues: Hand numbness, likely due to carpal tunnel syndrome, recommend braces, if not better needs to see ortho. Patient concerned about the "lumps in the skin", likely due to sebaceous cysts, see physical exam, recommend observation Anxiety, on Xanax as needed, check a UDS

## 2015-06-28 NOTE — Progress Notes (Signed)
Pre visit review using our clinic review tool, if applicable. No additional management support is needed unless otherwise documented below in the visit note. 

## 2015-06-29 LAB — FOLATE: Folate: 10.5 ng/mL (ref >5.9)

## 2015-06-29 LAB — VITAMIN B12: Vitamin B-12: 251 pg/mL (ref 211–911)

## 2015-07-06 ENCOUNTER — Telehealth: Payer: Self-pay

## 2015-07-06 NOTE — Telephone Encounter (Signed)
UDS: 06/28/2015  Negative for Alprazolam: PRN    Low risk per Dr. Larose Kells 07/06/2015

## 2015-09-19 ENCOUNTER — Encounter: Payer: Self-pay | Admitting: Women's Health

## 2015-09-19 ENCOUNTER — Ambulatory Visit (INDEPENDENT_AMBULATORY_CARE_PROVIDER_SITE_OTHER): Admitting: Women's Health

## 2015-09-19 VITALS — BP 118/76

## 2015-09-19 DIAGNOSIS — R300 Dysuria: Secondary | ICD-10-CM

## 2015-09-19 DIAGNOSIS — N3001 Acute cystitis with hematuria: Secondary | ICD-10-CM | POA: Diagnosis not present

## 2015-09-19 LAB — URINALYSIS W MICROSCOPIC + REFLEX CULTURE
BILIRUBIN URINE: NEGATIVE
CASTS: NONE SEEN [LPF]
CRYSTALS: NONE SEEN [HPF]
Glucose, UA: NEGATIVE
NITRITE: NEGATIVE
Protein, ur: NEGATIVE
SPECIFIC GRAVITY, URINE: 1.02 (ref 1.001–1.035)
Yeast: NONE SEEN [HPF]
pH: 5.5 (ref 5.0–8.0)

## 2015-09-19 MED ORDER — SULFAMETHOXAZOLE-TRIMETHOPRIM 800-160 MG PO TABS: 1.0000 | ORAL_TABLET | Freq: Two times a day (BID) | ORAL | Status: DC

## 2015-09-19 NOTE — Progress Notes (Signed)
Patient ID: Debra Norton, female   DOB: [birth year], 59 y.o.   MRN: [id number] Presents with a 4 day history of dysuria, hematuria, cloudy urine, frequency, and urgency, noted day after intercourse.  Today she passed  blood clot with urination.  Denies fever, discharge, odor, back pain, abdominal pain or h/o kidney stones.  Postmenopausal/no bleeding/no HRT.  She quit smoking in the 70's.    Exam:  Appears well.  External genitalia normal with no blood seen at urethral meatus.  Speculum exam reveals non-erythematous vaginal walls, minimal white discharge, no blood seen.   UA:  Blood: 3+, Leu: 1+, WBC: 40-60, RBC: >60, Bacteria: Many  UTI with hematuria  Plan: Urine culture pending.   Septra 800-160mg  BID, x3 days.  Return for test of cure in 2-3 weeks.  UTI prevention and pathogenesis discussed.  Call if symptoms do not resolve  or if they continue to worsen.

## 2015-09-19 NOTE — Patient Instructions (Signed)

## 2015-09-22 LAB — URINE CULTURE

## 2015-09-27 ENCOUNTER — Encounter: Payer: Self-pay | Admitting: Gastroenterology

## 2015-12-01 ENCOUNTER — Ambulatory Visit: Admitting: Gastroenterology

## 2016-03-25 ENCOUNTER — Other Ambulatory Visit: Payer: Self-pay

## 2016-03-25 DIAGNOSIS — F411 Generalized anxiety disorder: Secondary | ICD-10-CM

## 2016-03-25 MED ORDER — ALPRAZOLAM 0.25 MG PO TABS: ORAL_TABLET | ORAL | Status: DC

## 2016-03-25 NOTE — Telephone Encounter (Signed)
Called into pharmacy

## 2016-03-25 NOTE — Telephone Encounter (Signed)
Okay for refill?  

## 2016-05-27 ENCOUNTER — Encounter: Payer: Self-pay | Admitting: Gastroenterology

## 2016-07-01 ENCOUNTER — Encounter

## 2016-07-29 ENCOUNTER — Encounter: Admitting: Gastroenterology

## 2016-10-09 ENCOUNTER — Encounter: Payer: Self-pay | Admitting: Physician Assistant

## 2016-10-09 ENCOUNTER — Ambulatory Visit (INDEPENDENT_AMBULATORY_CARE_PROVIDER_SITE_OTHER): Admitting: Physician Assistant

## 2016-10-09 VITALS — BP 124/68 | HR 84 | Temp 98.2°F | Resp 16 | Ht 62.0 in | Wt 142.0 lb

## 2016-10-09 DIAGNOSIS — B9789 Other viral agents as the cause of diseases classified elsewhere: Secondary | ICD-10-CM

## 2016-10-09 DIAGNOSIS — T22112A Burn of first degree of left forearm, initial encounter: Secondary | ICD-10-CM

## 2016-10-09 DIAGNOSIS — J069 Acute upper respiratory infection, unspecified: Secondary | ICD-10-CM

## 2016-10-09 MED ORDER — BENZONATATE 100 MG PO CAPS: 100.0000 mg | ORAL_CAPSULE | Freq: Three times a day (TID) | ORAL | 0 refills | Status: DC | PRN

## 2016-10-09 NOTE — Progress Notes (Signed)
Patient presents to clinic today c/o 3.5 days of nasal drainage, dry cough, sinus pressure and chest congestion. Patient denies chest pain, SOB, sinus pain, ear pain or tooth pain. Denies fever.   Patient also endorses burning her anterior L forearm a few days ago while fixing some hot tea. Denies blistering of burn. Denies any residual pain or pruritus. Denies scabbing or crusting of burn.   Past Medical History:  Diagnosis Date  . Anxiety   . Cervical dysplasia   . Cholelithiasis     Current Outpatient Prescriptions on File Prior to Visit  Medication Sig Dispense Refill  . ALPRAZolam (XANAX) 0.25 MG tablet TAKE 1 TABLET BY MOUTH AT BEDTIME AS NEEDED FOR SLEEP 30 tablet 0  . meloxicam (MOBIC) 15 MG tablet Take 1 tablet (15 mg total) by mouth daily as needed for pain. 30 tablet 2   No current facility-administered medications on file prior to visit.     Allergies  Allergen Reactions  . Diphenhydramine Hcl     REACTION: VERY WIRED/palpitations  Benadryl  . Penicillins     REACTION: HIVES  . Tetanus Toxoid     REACTION: REALLY BAD SWELLING  . Clindamycin/Lincomycin Rash    Rash shortly after she finished a round of abx , see OV note 05-10-15  . Hydrocodone     Insomnia, itching (mild)    Family History  Problem Relation Age of Onset  . Colon cancer Mother     in her 59s  . Diabetes Sister   . Ovarian cancer Cousin     Mat. 1st cousin  . Breast cancer Neg Hx   . CAD Neg Hx     Social History   Social History  . Marital status: Widowed    Spouse name: N/A  . Number of children: 0  . Years of education: N/A   Occupational History  . active at work Unemployed   Social History Main Topics  . Smoking status: Former Research scientist (life sciences)  . Smokeless tobacco: Never Used     Comment: quit in high School  . Alcohol use 1.2 oz/week    2 Standard drinks or equivalent per week     Comment: socially  . Drug use: No  . Sexual activity: Yes    Birth control/ protection:    Comment: INTERCOURSE AGE 54, SEXUAL PARTNERS  MORE THAN 5   Other Topics Concern  . None   Social History Narrative   Lives with boy friend      G4 P0    miscarriage x 3    ectopic x 1     Review of Systems - See HPI.  All other ROS are negative.  BP (!) 0/0 (BP Location: Right Arm, Patient Position: Sitting, Cuff Size: Normal)   Physical Exam  Constitutional: She is oriented to person, place, and time and well-developed, well-nourished, and in no distress.  HENT:  Head: Normocephalic and atraumatic.  Right Ear: External ear normal.  Left Ear: External ear normal.  Nose: Nose normal.  Mouth/Throat: Oropharynx is clear and moist. No oropharyngeal exudate.  TM within normal limits bilaterally  Eyes: Conjunctivae are normal.  Neck: Neck supple.  Cardiovascular: Normal rate, regular rhythm, normal heart sounds and intact distal pulses.   Pulmonary/Chest: Effort normal and breath sounds normal. No respiratory distress. She has no wheezes. She has no rales. She exhibits no tenderness.  Lymphadenopathy:    She has no cervical adenopathy.  Neurological: She is alert and oriented to person, place,  and time.  Skin: Skin is warm and dry. No rash noted.  Psychiatric: Affect normal.  Vitals reviewed.   No results found for this or any previous visit (from the past 2160 hour(s)).  Assessment/Plan: 1. Viral URI with cough Rx Tessalon. Supportive measures and OTC medications reviewed. FU if not resolving. - benzonatate (TESSALON) 100 MG capsule; Take 1 capsule (100 mg total) by mouth 3 (three) times daily as needed.  Dispense: 30 capsule; Refill: 0  2. Superficial burn of left forearm, initial encounter No blistering or sign of secondary infection. Supportive measures and wound care reviewed. FU PRN.  Leeanne Rio, PA-C

## 2016-10-09 NOTE — Patient Instructions (Signed)
Please stay well hydrated and get plenty of rest.  Place a humidifier in the bedroom. Saline nasal spray to rinse out nasal passages. Use the Tessalon as directed for cough.  For the burn -- this is 1st degree. Keep moisturized since already healing. Should heal nicely.

## 2016-10-09 NOTE — Progress Notes (Signed)
Pre visit review using our clinic review tool, if applicable. No additional management support is needed unless otherwise documented below in the visit note/SLS  

## 2016-11-25 ENCOUNTER — Ambulatory Visit (HOSPITAL_BASED_OUTPATIENT_CLINIC_OR_DEPARTMENT_OTHER)
Admission: RE | Admit: 2016-11-25 | Discharge: 2016-11-25 | Disposition: A | Discharge status: Home / Self Care | Source: Ambulatory Visit | Attending: Family | Admitting: Family

## 2016-11-25 ENCOUNTER — Ambulatory Visit (INDEPENDENT_AMBULATORY_CARE_PROVIDER_SITE_OTHER): Admitting: Family

## 2016-11-25 ENCOUNTER — Encounter: Payer: Self-pay | Admitting: Family

## 2016-11-25 VITALS — BP 115/59 | HR 82 | Temp 98.4°F | Resp 16 | Ht 62.0 in | Wt 141.2 lb

## 2016-11-25 DIAGNOSIS — R059 Cough, unspecified: Secondary | ICD-10-CM

## 2016-11-25 DIAGNOSIS — R05 Cough: Secondary | ICD-10-CM | POA: Insufficient documentation

## 2016-11-25 LAB — POC INFLUENZA A&B (BINAX/QUICKVUE)
INFLUENZA A, POC: NEGATIVE
Influenza B, POC: NEGATIVE

## 2016-11-25 LAB — POCT RAPID STREP A (OFFICE): Rapid Strep A Screen: NEGATIVE

## 2016-11-25 MED ORDER — DOXYCYCLINE HYCLATE 100 MG PO TABS: 100.0000 mg | ORAL_TABLET | Freq: Two times a day (BID) | ORAL | 0 refills | Status: DC

## 2016-11-25 MED ORDER — GUAIFENESIN-CODEINE 100-10 MG/5ML PO SYRP: 5.0000 mL | ORAL_SOLUTION | Freq: Three times a day (TID) | ORAL | 0 refills | Status: DC | PRN

## 2016-11-25 NOTE — Progress Notes (Signed)
Subjective:    Patient ID: ENIYA CAZIER, female    DOB: 1956/09/26, 60 y.o.   MRN: [id number]  HPI  [name] is a 60 yr old female who presents today with chief complaint of cough. Cough has been present for 6 days. Cough is associated with head congestion.  Has has temp with a Tmax of 101.  Taking multiple otc meds with little relief of symptoms. Reports that she has sensitive teeth, + arthralgia, + dizziness.   Review of Systems See HPI  Past Medical History:  Diagnosis Date  . Anxiety   . Cervical dysplasia   . Cholelithiasis      Social History   Social History  . Marital status: Widowed    Spouse name: N/A  . Number of children: 0  . Years of education: N/A   Occupational History  . active at work Unemployed   Social History Main Topics  . Smoking status: Former Research scientist (life sciences)  . Smokeless tobacco: Never Used     Comment: quit in high School  . Alcohol use 1.2 oz/week    2 Standard drinks or equivalent per week     Comment: socially  . Drug use: No  . Sexual activity: Yes    Birth control/ protection:      Comment: INTERCOURSE AGE 76, SEXUAL PARTNERS  MORE THAN 5   Other Topics Concern  . Not on file   Social History Narrative   Lives with boy friend      G4 P0    miscarriage x 3    ectopic x 1     Past Surgical History:  Procedure Laterality Date  . COLPOSCOPY    . ECTOPIC PREGNANCY SURGERY    . EXTERNAL EAR SURGERY    . GYNECOLOGIC CRYOSURGERY      Family History  Problem Relation Age of Onset  . Colon cancer Mother     in her 45s  . Diabetes Sister   . Ovarian cancer Cousin     Mat. 1st cousin  . Breast cancer Neg Hx   . CAD Neg Hx     Allergies  Allergen Reactions  . Diphenhydramine Hcl     REACTION: VERY WIRED/palpitations  Benadryl  . Penicillins     REACTION: HIVES  . Tetanus Toxoid     REACTION: REALLY BAD SWELLING  . Clindamycin/Lincomycin Rash    Rash shortly after she finished a round of abx , see OV note  05-10-15  . Hydrocodone     Insomnia, itching (mild)    Current Outpatient Prescriptions on File Prior to Visit  Medication Sig Dispense Refill  . ALPRAZolam (XANAX) 0.25 MG tablet TAKE 1 TABLET BY MOUTH AT BEDTIME AS NEEDED FOR SLEEP 30 tablet 0  . benzonatate (TESSALON) 100 MG capsule Take 1 capsule (100 mg total) by mouth 3 (three) times daily as needed. 30 capsule 0  . meloxicam (MOBIC) 15 MG tablet Take 1 tablet (15 mg total) by mouth daily as needed for pain. 30 tablet 2  . Multiple Vitamins-Minerals (WOMENS MULTIVITAMIN PLUS) TABS Take by mouth daily.     No current facility-administered medications on file prior to visit.     BP (!) 115/59 (BP Location: Right Arm, Cuff Size: Normal)   Pulse 82   Temp 98.4 F (36.9 C) (Oral)   Resp 16   Ht 5\' 2"  (1.575 m)   Wt 141 lb 3.2 oz (64 kg)   SpO2 100% Comment: room air  BMI 25.83  kg/m       Objective:   Physical Exam  Constitutional: She is oriented to person, place, and time. She appears well-developed and well-nourished.  HENT:  Head: Normocephalic and atraumatic.  Cardiovascular: Normal rate, regular rhythm and normal heart sounds.   No murmur heard. Pulmonary/Chest: Effort normal and breath sounds normal. No respiratory distress. She has no wheezes.  Musculoskeletal: She exhibits no edema.  Neurological: She is alert and oriented to person, place, and time.  Psychiatric: She has a normal mood and affect. Her behavior is normal. Judgment and thought content normal.          Assessment & Plan:  Bronchitis- CXR is performed and is neg for PNA. Will plan rx for bronchitis with doxycycline.  This will also cover any developing sinusitis due to her c/o tooth pain. Pt is PEN allergic.  Pt is advised as follows:   You may use cheratussin (codeine cough syrup as needed) this may make you drowsy during the day so I would only recommend using at night. During the day you can use otc delsym. Call if symptoms worsen or if  symptoms do not improve.

## 2016-11-25 NOTE — Patient Instructions (Addendum)
Please begin doxycycline for your sinus and bronchitis.  Complete chest x ray on the first floor.  You may use cheratussin (codeine cough syrup as needed) this may make you drowsy during the day so I would only recommend using at night. During the day you can use otc delsym. Call if symptoms worsen or if symptoms do not improve.

## 2016-11-25 NOTE — Progress Notes (Signed)
Pre visit review using our clinic review tool, if applicable. No additional management support is needed unless otherwise documented below in the visit note. 

## 2016-11-26 ENCOUNTER — Encounter: Payer: Self-pay | Admitting: Family

## 2016-11-29 NOTE — Telephone Encounter (Signed)
Reviewed pt's allergy list and see that she can't take hydrocodone.  Spoke with pt. Advised her to try 1.5-2 tsp of cheratussin. She also has some tessalon perles at home which she can try.  Advised pt to seek follow up in urgent care in [place] if symptoms do not improve. Pt verbalizes understanding.

## 2016-12-10 ENCOUNTER — Encounter: Payer: Self-pay | Admitting: Family

## 2016-12-10 ENCOUNTER — Other Ambulatory Visit: Payer: Self-pay | Admitting: Internal Medicine

## 2016-12-10 MED ORDER — AZELASTINE HCL 0.1 % NA SOLN: 2.0000 | Freq: Every evening | NASAL | 3 refills | Status: DC | PRN

## 2017-02-14 ENCOUNTER — Telehealth: Payer: Self-pay

## 2017-02-14 NOTE — Telephone Encounter (Signed)
Rx called in    Patient informed.

## 2017-02-14 NOTE — Telephone Encounter (Signed)
Patient called requesting a refill on her Xanax.  She was last seen for annual exam 06/07/15.   She states she has appt scheduled for CE on 02/18/17 with you. Refill?

## 2017-02-14 NOTE — Telephone Encounter (Signed)
Okay for refill?  

## 2017-02-18 ENCOUNTER — Encounter: Payer: Self-pay | Admitting: Women's Health

## 2017-02-18 ENCOUNTER — Ambulatory Visit (INDEPENDENT_AMBULATORY_CARE_PROVIDER_SITE_OTHER): Admitting: Women's Health

## 2017-02-18 VITALS — BP 110/70 | Ht 61.75 in | Wt 139.8 lb

## 2017-02-18 DIAGNOSIS — H8110 Benign paroxysmal vertigo, unspecified ear: Secondary | ICD-10-CM | POA: Diagnosis not present

## 2017-02-18 DIAGNOSIS — Z01419 Encounter for gynecological examination (general) (routine) without abnormal findings: Secondary | ICD-10-CM | POA: Diagnosis not present

## 2017-02-18 DIAGNOSIS — Z13820 Encounter for screening for osteoporosis: Secondary | ICD-10-CM | POA: Diagnosis not present

## 2017-02-18 DIAGNOSIS — Z13220 Encounter for screening for lipoid disorders: Secondary | ICD-10-CM

## 2017-02-18 LAB — COMPREHENSIVE METABOLIC PANEL
ALBUMIN: 4.2 g/dL (ref 3.6–5.1)
ALK PHOS: 63 U/L (ref 33–130)
ALT: 17 U/L (ref 6–29)
AST: 24 U/L (ref 10–35)
BILIRUBIN TOTAL: 0.4 mg/dL (ref 0.2–1.2)
BUN: 16 mg/dL (ref 7–25)
CO2: 23 mmol/L (ref 20–31)
CREATININE: 0.75 mg/dL (ref 0.50–1.05)
Calcium: 9.4 mg/dL (ref 8.6–10.4)
Chloride: 106 mmol/L (ref 98–110)
Glucose, Bld: 73 mg/dL (ref 65–99)
Potassium: 4.3 mmol/L (ref 3.5–5.3)
SODIUM: 140 mmol/L (ref 135–146)
TOTAL PROTEIN: 6.6 g/dL (ref 6.1–8.1)

## 2017-02-18 LAB — CBC WITH DIFFERENTIAL/PLATELET
BASOS PCT: 1 %
Basophils Absolute: 56 cells/uL (ref 0–200)
EOS ABS: 56 {cells}/uL (ref 15–500)
Eosinophils Relative: 1 %
HCT: 38.6 % (ref 35.0–45.0)
HEMOGLOBIN: 12.8 g/dL (ref 11.7–15.5)
LYMPHS ABS: 2408 {cells}/uL (ref 850–3900)
Lymphocytes Relative: 43 %
MCH: 30.1 pg (ref 27.0–33.0)
MCHC: 33.2 g/dL (ref 32.0–36.0)
MCV: 90.8 fL (ref 80.0–100.0)
MONO ABS: 448 {cells}/uL (ref 200–950)
MPV: 11.5 fL (ref 7.5–12.5)
Monocytes Relative: 8 %
Neutro Abs: 2632 cells/uL (ref 1500–7800)
Neutrophils Relative %: 47 %
Platelets: 275 10*3/uL (ref 140–400)
RBC: 4.25 MIL/uL (ref 3.80–5.10)
RDW: 13.5 % (ref 11.0–15.0)
WBC: 5.6 10*3/uL (ref 3.8–10.8)

## 2017-02-18 LAB — LIPID PANEL
CHOLESTEROL: 191 mg/dL (ref <200)
HDL: 63 mg/dL (ref >50)
LDL Cholesterol: 113 mg/dL — ABNORMAL HIGH (ref <100)
Total CHOL/HDL Ratio: 3 Ratio (ref <5.0)
Triglycerides: 76 mg/dL (ref <150)
VLDL: 15 mg/dL (ref <30)

## 2017-02-18 LAB — TSH: TSH: 4.35 mIU/L

## 2017-02-18 NOTE — Progress Notes (Signed)
Debra Norton [birth year] [id number]    History:    Presents for annual exam.  Postmenopausal on no HRT with no bleeding. Normal Pap and mammogram history. Normal DEXA in the past. 2011 benign colon polyp needs follow-up, mother history of colon cancer. Has had dizziness for the past 2+ years has had follow-up with primary care without resolution of problem. Vertigo episodes last 15 seconds where she feels a loss of balance, no history of syncope or nausea.  Past medical history, past surgical history, family history and social history were all reviewed and documented in the EPIC chart. Works at [employer]. Long-term partner has 3 sons.  ROS:  A ROS was performed and pertinent positives and negatives are included.  Exam:  Vitals:   02/18/17 1056  BP: 110/70  Weight: 139 lb 12.8 oz (63.4 kg)  Height: 5' 1.75" (1.568 m)   Body mass index is 25.78 kg/m.   General appearance:  Normal Thyroid:  Symmetrical, normal in size, without palpable masses or nodularity. Respiratory  Auscultation:  Clear without wheezing or rhonchi Cardiovascular  Auscultation:  Regular rate, without rubs, murmurs or gallops  Edema/varicosities:  Not grossly evident Abdominal  Soft,nontender, without masses, guarding or rebound.  Liver/spleen:  No organomegaly noted  Hernia:  None appreciated  Skin  Inspection:  Grossly normal   Breasts: Examined lying and sitting.     Right: Without masses, retractions, discharge or axillary adenopathy.     Left: Without masses, retractions, discharge or axillary adenopathy. Gentitourinary   Inguinal/mons:  Normal without inguinal adenopathy  External genitalia:  Normal  BUS/Urethra/Skene's glands:  Normal  Vagina:  Normal  Cervix:  Normal  Uterus:   normal in size, shape and contour.  Midline and mobile  Adnexa/parametria:     Rt: Without masses or tenderness.   Lt: Without masses or tenderness.  Anus and  perineum: Normal  Digital rectal exam: Normal sphincter tone without palpated masses or tenderness  Assessment/Plan:  61 y.o. S WF G3 P0  for annual exam.    Postmenopausal/no HRT/no bleeding Vertigo-Will refer to ENT.  Plan:  DEXA. Zostavax at age 70. SBE's, overdue for mammogram and instructed to schedule. Exercise, calcium rich diet, vitamin D 2000 daily encouraged. Instructed to follow-up for repeat screening colonoscopy. CBC, lipid panel, TSH, CMP, UA, Pap normal with negative HR HPV 2016, new screening guidelines reviewed.  [place], 11:35 AM 02/18/2017

## 2017-02-18 NOTE — Patient Instructions (Addendum)
zostavac vaccine  Colonoscopy  Dr Carlean Purl at [facility] Menopause is a normal process in which your reproductive ability comes to an end. This process happens gradually over a span of months to years, usually between the ages of 60 and 58. Menopause is complete when you have missed 12 consecutive menstrual periods. It is important to talk with your health care provider about some of the most common conditions that affect postmenopausal women, such as heart disease, cancer, and bone loss (osteoporosis). Adopting a healthy lifestyle and getting preventive care can help to promote your health and wellness. Those actions can also lower your chances of developing some of these common conditions. What should I know about menopause? During menopause, you may experience a number of symptoms, such as:  Moderate-to-severe hot flashes.  Night sweats.  Decrease in sex drive.  Mood swings.  Headaches.  Tiredness.  Irritability.  Memory problems.  Insomnia. Choosing to treat or not to treat menopausal changes is an individual decision that you make with your health care provider. What should I know about hormone replacement therapy and supplements? Hormone therapy products are effective for treating symptoms that are associated with menopause, such as hot flashes and night sweats. Hormone replacement carries certain risks, especially as you become older. If you are thinking about using estrogen or estrogen with progestin treatments, discuss the benefits and risks with your health care provider. What should I know about heart disease and stroke? Heart disease, heart attack, and stroke become more likely as you age. This may be due, in part, to the hormonal changes that your body experiences during menopause. These can affect how your body processes dietary fats, triglycerides, and cholesterol. Heart attack and stroke are both medical  emergencies. There are many things that you can do to help prevent heart disease and stroke:  Have your blood pressure checked at least every 1-2 years. High blood pressure causes heart disease and increases the risk of stroke.  If you are 60-81 years old, ask your health care provider if you should take aspirin to prevent a heart attack or a stroke.  Do not use any tobacco products, including cigarettes, chewing tobacco, or electronic cigarettes. If you need help quitting, ask your health care provider.  It is important to eat a healthy diet and maintain a healthy weight.  Be sure to include plenty of vegetables, fruits, low-fat dairy products, and lean protein.  Avoid eating foods that are high in solid fats, added sugars, or salt (sodium).  Get regular exercise. This is one of the most important things that you can do for your health.  Try to exercise for at least 150 minutes each week. The type of exercise that you do should increase your heart rate and make you sweat. This is known as moderate-intensity exercise.  Try to do strengthening exercises at least twice each week. Do these in addition to the moderate-intensity exercise.  Know your numbers.Ask your health care provider to check your cholesterol and your blood glucose. Continue to have your blood tested as directed by your health care provider. What should I know about cancer screening? There are several types of cancer. Take the following steps to reduce your risk and to catch any cancer development as early as possible. Breast Cancer  Practice breast self-awareness.  This means understanding how your breasts normally appear and feel.  It also means doing regular breast self-exams. Let your health care provider know about any  changes, no matter how small.  If you are 60 or older, have a clinician do a breast exam (clinical breast exam or CBE) every year. Depending on your age, family history, and medical history, it may  be recommended that you also have a yearly breast X-ray (mammogram).  If you have a family history of breast cancer, talk with your health care provider about genetic screening.  If you are at high risk for breast cancer, talk with your health care provider about having an MRI and a mammogram every year.  Breast cancer (BRCA) gene test is recommended for women who have family members with BRCA-related cancers. Results of the assessment will determine the need for genetic counseling and BRCA1 and for BRCA2 testing. BRCA-related cancers include these types:  Breast. This occurs in males or females.  Ovarian.  Tubal. This may also be called fallopian tube cancer.  Cancer of the abdominal or pelvic lining (peritoneal cancer).  Prostate.  Pancreatic. Cervical, Uterine, and Ovarian Cancer  Your health care provider may recommend that you be screened regularly for cancer of the pelvic organs. These include your ovaries, uterus, and vagina. This screening involves a pelvic exam, which includes checking for microscopic changes to the surface of your cervix (Pap test).  For women ages 21-65, health care providers may recommend a pelvic exam and a Pap test every three years. For women ages 60-65, they may recommend the Pap test and pelvic exam, combined with testing for human papilloma virus (HPV), every five years. Some types of HPV increase your risk of cervical cancer. Testing for HPV may also be done on women of any age who have unclear Pap test results.  Other health care providers may not recommend any screening for nonpregnant women who are considered low risk for pelvic cancer and have no symptoms. Ask your health care provider if a screening pelvic exam is right for you.  If you have had past treatment for cervical cancer or a condition that could lead to cancer, you need Pap tests and screening for cancer for at least 20 years after your treatment. If Pap tests have been discontinued for  you, your risk factors (such as having a new sexual partner) need to be reassessed to determine if you should start having screenings again. Some women have medical problems that increase the chance of getting cervical cancer. In these cases, your health care provider may recommend that you have screening and Pap tests more often.  If you have a family history of uterine cancer or ovarian cancer, talk with your health care provider about genetic screening.  If you have vaginal bleeding after reaching menopause, tell your health care provider.  There are currently no reliable tests available to screen for ovarian cancer. Lung Cancer  Lung cancer screening is recommended for adults 75-73 years old who are at high risk for lung cancer because of a history of smoking. A yearly low-dose CT scan of the lungs is recommended if you:  Currently smoke.  Have a history of at least 30 pack-years of smoking and you currently smoke or have quit within the past 15 years. A pack-year is smoking an average of one pack of cigarettes per day for one year. Yearly screening should:  Continue until it has been 15 years since you quit.  Stop if you develop a health problem that would prevent you from having lung cancer treatment. Colorectal Cancer  This type of cancer can be detected and can often be prevented.  Routine colorectal cancer screening usually begins at age 39 and continues through age 76.  If you have risk factors for colon cancer, your health care provider may recommend that you be screened at an earlier age.  If you have a family history of colorectal cancer, talk with your health care provider about genetic screening.  Your health care provider may also recommend using home test kits to check for hidden blood in your stool.  A small camera at the end of a tube can be used to examine your colon directly (sigmoidoscopy or colonoscopy). This is done to check for the earliest forms of colorectal  cancer.  Direct examination of the colon should be repeated every 5-10 years until age 20. However, if early forms of precancerous polyps or small growths are found or if you have a family history or genetic risk for colorectal cancer, you may need to be screened more often. Skin Cancer  Check your skin from head to toe regularly.  Monitor any moles. Be sure to tell your health care provider:  About any new moles or changes in moles, especially if there is a change in a mole's shape or color.  If you have a mole that is larger than the size of a pencil eraser.  If any of your family members has a history of skin cancer, especially at a Mckynlie Vanderslice age, talk with your health care provider about genetic screening.  Always use sunscreen. Apply sunscreen liberally and repeatedly throughout the day.  Whenever you are outside, protect yourself by wearing long sleeves, pants, a wide-brimmed hat, and sunglasses. What should I know about osteoporosis? Osteoporosis is a condition in which bone destruction happens more quickly than new bone creation. After menopause, you may be at an increased risk for osteoporosis. To help prevent osteoporosis or the bone fractures that can happen because of osteoporosis, the following is recommended:  If you are 30-76 years old, get at least 1,000 mg of calcium and at least 600 mg of vitamin D per day.  If you are older than age 71 but younger than age 28, get at least 1,200 mg of calcium and at least 600 mg of vitamin D per day.  If you are older than age 72, get at least 1,200 mg of calcium and at least 800 mg of vitamin D per day. Smoking and excessive alcohol intake increase the risk of osteoporosis. Eat foods that are rich in calcium and vitamin D, and do weight-bearing exercises several times each week as directed by your health care provider. What should I know about how menopause affects my mental health? Depression may occur at any age, but it is more common as  you become older. Common symptoms of depression include:  Low or sad mood.  Changes in sleep patterns.  Changes in appetite or eating patterns.  Feeling an overall lack of motivation or enjoyment of activities that you previously enjoyed.  Frequent crying spells. Talk with your health care provider if you think that you are experiencing depression. What should I know about immunizations? It is important that you get and maintain your immunizations. These include:  Tetanus, diphtheria, and pertussis (Tdap) booster vaccine.  Influenza every year before the flu season begins.  Pneumonia vaccine.  Shingles vaccine. Your health care provider may also recommend other immunizations. This information is not intended to replace advice given to you by your health care provider. Make sure you discuss any questions you have with your health care provider. Document  Released: 01/24/2006 Document Revised: 06/21/2016 Document Reviewed: 09/05/2015 Elsevier Interactive Patient Education  2017 [employer].

## 2017-02-19 ENCOUNTER — Telehealth: Payer: Self-pay | Admitting: *Deleted

## 2017-02-19 ENCOUNTER — Encounter: Payer: Self-pay | Admitting: Women's Health

## 2017-02-19 NOTE — Telephone Encounter (Signed)
-----   Message from Huel Cote, NP sent at 02/18/2017 11:28 AM EST ----- Please schedule ENT appt  Vertigo without syncope for several years atleast 2 years.  15 seconds.  No nausea with it.  No hx of recurrent ear infections.  Mother has vertigo also.  This wk best on spring break (works at [employer])  If cancellation would be great or fridays best

## 2017-02-19 NOTE — Telephone Encounter (Signed)
Appointment on 02/24/17 @ 1:00pm, notes faxed, pt informed.

## 2017-02-20 ENCOUNTER — Other Ambulatory Visit: Payer: Self-pay | Admitting: Women's Health

## 2017-02-20 ENCOUNTER — Ambulatory Visit (INDEPENDENT_AMBULATORY_CARE_PROVIDER_SITE_OTHER)

## 2017-02-20 DIAGNOSIS — Z13820 Encounter for screening for osteoporosis: Secondary | ICD-10-CM | POA: Diagnosis not present

## 2017-02-20 DIAGNOSIS — M8589 Other specified disorders of bone density and structure, multiple sites: Secondary | ICD-10-CM | POA: Diagnosis not present

## 2017-02-21 ENCOUNTER — Other Ambulatory Visit: Payer: Self-pay | Admitting: *Deleted

## 2017-02-21 DIAGNOSIS — M818 Other osteoporosis without current pathological fracture: Secondary | ICD-10-CM

## 2017-02-28 ENCOUNTER — Other Ambulatory Visit

## 2017-02-28 DIAGNOSIS — M818 Other osteoporosis without current pathological fracture: Secondary | ICD-10-CM

## 2017-03-03 LAB — VITAMIN D 1,25 DIHYDROXY
VITAMIN D 1, 25 (OH) TOTAL: 38 pg/mL (ref 18–72)
VITAMIN D3 1, 25 (OH): 38 pg/mL
Vitamin D2 1, 25 (OH)2: <8 pg/mL

## 2017-04-30 ENCOUNTER — Encounter: Payer: Self-pay | Admitting: Gynecology

## 2017-06-17 ENCOUNTER — Other Ambulatory Visit: Payer: Self-pay | Admitting: Women's Health

## 2017-06-17 ENCOUNTER — Telehealth: Payer: Self-pay | Admitting: *Deleted

## 2017-06-17 ENCOUNTER — Other Ambulatory Visit

## 2017-06-17 DIAGNOSIS — F411 Generalized anxiety disorder: Secondary | ICD-10-CM

## 2017-06-17 DIAGNOSIS — R42 Dizziness and giddiness: Secondary | ICD-10-CM

## 2017-06-17 MED ORDER — ALPRAZOLAM 0.25 MG PO TABS: ORAL_TABLET | ORAL | 0 refills | Status: DC

## 2017-06-17 NOTE — Telephone Encounter (Signed)
Pt just received you my chart message from 02/19/17 you asked her to call to speak with you regarding synthroid [phone], also would like refill on Xanax 0.25.

## 2017-06-17 NOTE — Telephone Encounter (Signed)
Telephone call, reviewed TSH 4.35 which is slightly hypothyroid, but reports 2 sisters and mother are also hypothyroid. Will come in today for thyroid panel and pickup refill Rx for Xanax. Aware to use sparingly and is addictive.  Abigail Butts,  please put in lab appointment for today she plans to come this afternoon for thyroid panel -  thanks

## 2017-06-18 LAB — THYROID PANEL WITH TSH
Free Thyroxine Index: 2 (ref 1.4–3.8)
T3 Uptake: 30 % (ref 22–35)
T4 TOTAL: 6.5 ug/dL (ref 4.5–12.0)
TSH: 4.17 mIU/L

## 2017-06-19 ENCOUNTER — Encounter: Payer: Self-pay | Admitting: Women's Health

## 2017-07-23 ENCOUNTER — Encounter: Payer: Self-pay | Admitting: Internal Medicine

## 2017-07-30 ENCOUNTER — Encounter: Payer: Self-pay | Admitting: Internal Medicine

## 2017-07-30 ENCOUNTER — Ambulatory Visit (INDEPENDENT_AMBULATORY_CARE_PROVIDER_SITE_OTHER): Admitting: Internal Medicine

## 2017-07-30 VITALS — BP 118/74 | HR 91 | Temp 98.1°F | Resp 14 | Ht 61.75 in | Wt 144.2 lb

## 2017-07-30 DIAGNOSIS — Z8719 Personal history of other diseases of the digestive system: Secondary | ICD-10-CM

## 2017-07-30 DIAGNOSIS — R1013 Epigastric pain: Secondary | ICD-10-CM

## 2017-07-30 DIAGNOSIS — R1011 Right upper quadrant pain: Secondary | ICD-10-CM

## 2017-07-30 DIAGNOSIS — Z09 Encounter for follow-up examination after completed treatment for conditions other than malignant neoplasm: Secondary | ICD-10-CM | POA: Insufficient documentation

## 2017-07-30 DIAGNOSIS — K8020 Calculus of gallbladder without cholecystitis without obstruction: Secondary | ICD-10-CM

## 2017-07-30 LAB — CBC WITH DIFFERENTIAL/PLATELET
BASOS ABS: 0 10*3/uL (ref 0.0–0.1)
Basophils Relative: 0.8 % (ref 0.0–3.0)
EOS ABS: 0.1 10*3/uL (ref 0.0–0.7)
Eosinophils Relative: 2 % (ref 0.0–5.0)
HEMATOCRIT: 40.5 % (ref 36.0–46.0)
HEMOGLOBIN: 13.3 g/dL (ref 12.0–15.0)
Lymphocytes Relative: 38.5 % (ref 12.0–46.0)
Lymphs Abs: 1.8 10*3/uL (ref 0.7–4.0)
MCHC: 33 g/dL (ref 30.0–36.0)
MCV: 91.5 fl (ref 78.0–100.0)
Monocytes Absolute: 0.3 10*3/uL (ref 0.1–1.0)
Monocytes Relative: 7.2 % (ref 3.0–12.0)
Neutro Abs: 2.4 10*3/uL (ref 1.4–7.7)
Neutrophils Relative %: 51.5 % (ref 43.0–77.0)
PLATELETS: 250 10*3/uL (ref 150.0–400.0)
RBC: 4.42 Mil/uL (ref 3.87–5.11)
RDW: 13.1 % (ref 11.5–15.5)
WBC: 4.7 10*3/uL (ref 4.0–10.5)

## 2017-07-30 LAB — COMPREHENSIVE METABOLIC PANEL
ALBUMIN: 4.3 g/dL (ref 3.5–5.2)
ALT: 13 U/L (ref 0–35)
AST: 19 U/L (ref 0–37)
Alkaline Phosphatase: 62 U/L (ref 39–117)
BUN: 13 mg/dL (ref 6–23)
CALCIUM: 9.3 mg/dL (ref 8.4–10.5)
CHLORIDE: 107 meq/L (ref 96–112)
CO2: 26 mEq/L (ref 19–32)
Creatinine, Ser: 0.72 mg/dL (ref 0.40–1.20)
GFR: 87.87 mL/min (ref >60.00)
GLUCOSE: 103 mg/dL — AB (ref 70–99)
Potassium: 3.8 mEq/L (ref 3.5–5.1)
Sodium: 140 mEq/L (ref 135–145)
Total Bilirubin: 0.5 mg/dL (ref 0.2–1.2)
Total Protein: 6.5 g/dL (ref 6.0–8.3)

## 2017-07-30 NOTE — Patient Instructions (Signed)
GO TO THE LAB : Get the blood work     GO TO THE FRONT DESK Schedule your next appointment for a  physical exam at your convenience in the next few months  Increase omeprazole to twice a day before meals  Call or go to the ER if severe symptoms  Further advise with results

## 2017-07-30 NOTE — Progress Notes (Signed)
Pre visit review using our clinic review tool, if applicable. No additional management support is needed unless otherwise documented below in the visit note. 

## 2017-07-30 NOTE — Assessment & Plan Note (Signed)
Dyspepsia: 61 year old lady with new onset dyspepsia, she has a h/o gallbladder stones documented by the ultrasound remotely. No red flag symptoms such as weight loss.  Plan: Continue omeprazole. Check a CMP, CBC. H. pylori breath test, US abdomen Further advice which results. If workup negative, will refer to GI. Recommend CPX at her convenience

## 2017-07-30 NOTE — Progress Notes (Signed)
Subjective:    Patient ID: Debra Norton, female    DOB: [birth year], 61 y.o.   MRN: [id number]  DOS:  07/30/2017 Type of visit - description : acute Interval history: Symptoms started 4 weeks ago: Having postprandial burping, feeling bloated, some nausea, "gassy". Symptoms are consistent with every meal. Not taking any new medications or NSAIDs.  Did start omeprazole at my request few days ago without improvement.  Review of Systems Denies fever, chills. No weight loss. Appetite remains normal. No vomiting, blood in the stools, change in the color of the stools. Reports no actual abdominal pain per se. No classic heartburn. No dysphagia or odynophagia  Past Medical History:  Diagnosis Date  . Anxiety   . Cervical dysplasia   . Cholelithiasis    16mm stone seen on ultrasound in 2009  . Migraine headache   . Vertigo     Past Surgical History:  Procedure Laterality Date  . COLPOSCOPY    . ECTOPIC PREGNANCY SURGERY    . EXTERNAL EAR SURGERY    . GYNECOLOGIC CRYOSURGERY      Social History   Social History  . Marital status: Widowed    Spouse name: N/A  . Number of children: 0  . Years of education: N/A   Occupational History  . active at work Unemployed   Social History Main Topics  . Smoking status: Former Research scientist (life sciences)  . Smokeless tobacco: Never Used     Comment: quit in high School  . Alcohol use 1.2 oz/week    2 Standard drinks or equivalent per week     Comment: socially  . Drug use: No  . Sexual activity: Yes    Birth control/ protection:      Comment: INTERCOURSE AGE 89, SEXUAL PARTNERS  MORE THAN 5   Other Topics Concern  . Not on file   Social History Narrative   Lives with boy friend      G4 P0    miscarriage x 3    ectopic x 1       Allergies as of 07/30/2017      Reactions   Diphenhydramine Hcl    REACTION: VERY WIRED/palpitations  Benadryl   Penicillins    REACTION: HIVES   Tetanus Toxoid    REACTION: REALLY BAD SWELLING   Clindamycin/lincomycin Rash   Rash shortly after she finished a round of abx , see OV note 05-10-15   Hydrocodone    Insomnia, itching (mild)      Medication List       Accurate as of 07/30/17  9:17 PM. Always use your most recent med list.          ALPRAZolam 0.25 MG tablet Commonly known as:  XANAX TAKE 1 TABLET BY MOUTH AT BEDTIME AS NEEDED FOR SLEEP   omeprazole 20 MG tablet Commonly known as:  PRILOSEC OTC Take 20 mg by mouth daily.          Objective:   Physical Exam BP 118/74 (BP Location: Left Arm, Patient Position: Sitting, Cuff Size: Small)   Pulse 91   Temp 98.1 F (36.7 C) (Oral)   Resp 14   Ht 5' 1.75" (1.568 m)   Wt 144 lb 4 oz (65.4 kg)   SpO2 98%   BMI 26.60 kg/m  General:   Well developed, well nourished . NAD.  HEENT:  Normocephalic . Face symmetric, atraumatic. Not jaundice Lungs:  CTA B Normal respiratory effort, no intercostal retractions, no accessory muscle use. Heart:  RRR,  no murmur.  no pretibial edema bilaterally  Abdomen:  Not distended, soft,  mild tenderness to palpation at the right upper quadrant of the abdomen without mass or rebound. Mild tenderness at the epigastric area as well. Skin: Not pale. Not jaundice Neurologic:  alert & oriented X3.  Speech normal, gait appropriate for age and unassisted Psych--  Cognition and judgment appear intact.  Cooperative with normal attention span and concentration.  Behavior appropriate. No anxious or depressed appearing.    Assessment & Plan:    Assessment H/o dizziness: 2016, saw neurology, EEG was negative. H/o cervical dysplasia H/o OSA ( Not on CPAP, states the second sleep study did not show any improvement with CPAP use)  PLAN: Dyspepsia: 61 year old lady with new onset dyspepsia, she has a h/o gallbladder stones documented by the ultrasound remotely. No red flag symptoms such as weight loss.  Plan: Continue omeprazole. Check a CMP, CBC. H. pylori breath test, US  abdomen Further advice which results. If workup negative, will refer to GI. Recommend CPX at her convenience

## 2017-07-31 LAB — H. PYLORI BREATH TEST: H. PYLORI BREATH TEST: NOT DETECTED

## 2017-08-02 ENCOUNTER — Ambulatory Visit (HOSPITAL_BASED_OUTPATIENT_CLINIC_OR_DEPARTMENT_OTHER)
Admission: RE | Admit: 2017-08-02 | Discharge: 2017-08-02 | Disposition: A | Discharge status: Home / Self Care | Source: Ambulatory Visit | Attending: Internal Medicine | Admitting: Internal Medicine

## 2017-08-02 DIAGNOSIS — Z8719 Personal history of other diseases of the digestive system: Secondary | ICD-10-CM | POA: Diagnosis present

## 2017-08-02 DIAGNOSIS — K8020 Calculus of gallbladder without cholecystitis without obstruction: Secondary | ICD-10-CM | POA: Diagnosis not present

## 2017-08-02 DIAGNOSIS — R1011 Right upper quadrant pain: Secondary | ICD-10-CM | POA: Diagnosis present

## 2017-08-02 DIAGNOSIS — R1013 Epigastric pain: Secondary | ICD-10-CM | POA: Diagnosis present

## 2017-08-04 NOTE — Addendum Note (Signed)
Addended byDamita Dunnings D on: 08/04/2017 07:44 AM   Modules accepted: Orders

## 2017-09-02 ENCOUNTER — Other Ambulatory Visit: Payer: Self-pay | Admitting: General Surgery

## 2017-09-12 ENCOUNTER — Encounter: Payer: Self-pay | Admitting: Internal Medicine

## 2017-09-12 DIAGNOSIS — Z0189 Encounter for other specified special examinations: Secondary | ICD-10-CM

## 2017-09-15 NOTE — Telephone Encounter (Signed)
Referral placed.

## 2017-09-15 NOTE — Telephone Encounter (Signed)
See message, refer to neurology, DX OSA  Received: 2 days ago  Message Contents  Larose Kells, Alda Berthold, MD  Damita Dunnings, [place]

## 2017-10-20 ENCOUNTER — Encounter: Payer: Self-pay | Admitting: Neurology

## 2017-10-20 ENCOUNTER — Ambulatory Visit (INDEPENDENT_AMBULATORY_CARE_PROVIDER_SITE_OTHER): Admitting: Neurology

## 2017-10-20 VITALS — BP 130/78 | HR 73 | Ht 62.0 in | Wt 143.0 lb

## 2017-10-20 DIAGNOSIS — R0689 Other abnormalities of breathing: Secondary | ICD-10-CM | POA: Diagnosis not present

## 2017-10-20 DIAGNOSIS — M2619 Other specified anomalies of jaw-cranial base relationship: Secondary | ICD-10-CM

## 2017-10-20 DIAGNOSIS — Z9189 Other specified personal risk factors, not elsewhere classified: Secondary | ICD-10-CM

## 2017-10-20 DIAGNOSIS — Z9889 Other specified postprocedural states: Secondary | ICD-10-CM | POA: Insufficient documentation

## 2017-10-20 DIAGNOSIS — R0902 Hypoxemia: Secondary | ICD-10-CM

## 2017-10-20 NOTE — Patient Instructions (Signed)

## 2017-10-20 NOTE — Progress Notes (Signed)
SLEEP MEDICINE CLINIC   Provider:  Larey Seat, M D  Primary Care Physician:  Colon Branch, MD   Referring Provider: Colon Branch, MD   Chief Complaint  Patient presents with  . New Patient (Initial Visit)    pt with friend, rm 16. pt had surgery and her O2 was low. pt has had 2 sleep studies completed. last one was 8-10 years ago. pt said it showed sleep apnea but pt was unable to wear it. pt has been told that she snores and stops breathing in sleep    HPI:  Debra Norton is a 61 y.o. female , seen here as in a referral from Dr. Larose Kells for a sleep evaluation,  The patient reports that she underwent at least 2 sleep studies, one at the [facility], another one at Dr. Mahalia Longest private sleep lab on [place].    She continues to feel that her sleep has a low quality is not sound and struggles with insomnia.  She also reports memory loss respiratory allergies, snoring, daytime fatigue recent surgeries she had a gallbladder surgery just earlier this year. It was right after the procedure under anesthesia that she was again witnessed to trouble with apnea, very low oxygen saturations, and was asked to follow-up here again. the anesthesiologist made it urgent     Chief complaint according to patient :" I snore" and I wake up from it" - "I sleep restless"  Sleep habits are as follows: She spends her evenings mostly watching TV for the last hours before she retreats to bed.  Bedtime is around 10 PM to 11 PM.  The bedroom is cool, quiet and dark in conducive to sleep.  She mostly sleeps alone , she likes to sleep on her side.  When she falls asleep she sleeps with 2 pillows. She usually dreams of the later morning hours before she wakes up.  Her dreams are not of nightmares character. He usually does not have nocturia and if only once. Her alarm is set for 6:30 AM but she already is awake at 4 AM and struggles to get another hour of sleep. She is tired in AM, no  dry mouth, but headaches. She does not nap during the day but may fall asleep watching TV before going to bed.  On weekends she sometimes takes an hour nap   Sleep medical history and family sleep history: She is not aware of any family members having obstructive sleep apnea or having been treated for this condition.  She was not a sleep walker.  Not undergo tonsillectomy, she has no history of traumatic brain injuries,ENT surgeries.   Social history: lives with boyfriend. Lives with a dog. She runs a Environmental consultant.  Smoked briefly during her high school years, does not use any other form of tobacco now.  She may drink 1-2 drinks a week.  Caffeine she will drink tea in the morning, no sodas or energy drinks- sometimes coffee. She would drink  2 times 12 ounces of hot tea or coffee in AM.   Review of Systems: Out of a complete 14 system review, the patient complains of only the following symptoms, and all other reviewed systems are negative.  Epworth sleepiness score was endorsed at 3 points, fatigue severity scale at 59 points, geriatric depression score at  6 out of 15 point    Social History   Socioeconomic History  . Marital status: Widowed    Spouse name:  Not on file  . Number of children: 0  . Years of education: Not on file  . Highest education level: Not on file  Social Needs  . Financial resource strain: Not on file  . Food insecurity - worry: Not on file  . Food insecurity - inability: Not on file  . Transportation needs - medical: Not on file  . Transportation needs - non-medical: Not on file  Occupational History  . Occupation: active at work    Fish farm manager: UNEMPLOYED  Tobacco Use  . Smoking status: Former Research scientist (life sciences)  . Smokeless tobacco: Never Used  . Tobacco comment: quit in high School  Substance and Sexual Activity  . Alcohol use: Yes    Alcohol/week: 1.2 oz    Types: 2 Standard drinks or equivalent per week    Comment: socially  . Drug use: No  . Sexual  activity: Yes    Comment: INTERCOURSE AGE 62, SEXUAL PARTNERS  MORE THAN 5  Other Topics Concern  . Not on file  Social History Narrative   Lives with boy friend      G4 P0    miscarriage x 3    ectopic x 1     Family History  Problem Relation Age of Onset  . Colon cancer Mother        in her 53s  . Diabetes Sister   . Ovarian cancer Cousin        Mat. 1st cousin  . Breast cancer Neg Hx   . CAD Neg Hx     Past Medical History:  Diagnosis Date  . Anxiety   . Cervical dysplasia   . Cholelithiasis    71mm stone seen on ultrasound in 2009  . Migraine headache   . Vertigo     Past Surgical History:  Procedure Laterality Date  . COLPOSCOPY    . ECTOPIC PREGNANCY SURGERY    . EXTERNAL EAR SURGERY    . GYNECOLOGIC CRYOSURGERY      Current Outpatient Medications  Medication Sig Dispense Refill  . ALPRAZolam (XANAX) 0.25 MG tablet TAKE 1 TABLET BY MOUTH AT BEDTIME AS NEEDED FOR SLEEP 30 tablet 0   No current facility-administered medications for this visit.     Allergies as of 10/20/2017 - Review Complete 10/20/2017  Allergen Reaction Noted  . Diphenhydramine hcl  06/26/2010  . Penicillins    . Tetanus toxoid    . Clindamycin/lincomycin Rash 05/10/2015  . Hydrocodone  09/01/2012    Vitals: BP 130/78   Pulse 73   Ht 5\' 2"  (1.575 m)   Wt 143 lb (64.9 kg)   BMI 26.16 kg/m  Last Weight:  Wt Readings from Last 1 Encounters:  10/20/17 143 lb (64.9 kg)   SWN:IOEV mass index is 26.16 kg/m.     Last Height:   Ht Readings from Last 1 Encounters:  10/20/17 5\' 2"  (1.575 m)    Physical exam:  General: The patient is awake, alert and appears not in acute distress. The patient is well groomed. Head: Normocephalic, atraumatic. Neck is supple. Mallampati 4 ,  neck circumference:13.5 . Nasal airflow patent , . Retrognathia is seen.  Cardiovascular:  Regular rate and rhythm  without  murmurs or carotid bruit, and without distended neck veins. Respiratory: Lungs are  clear to auscultation. Skin:  Without evidence of edema, or rash Trunk: BMI 26.16. The patient's posture is erect  Neurologic exam : The patient is awake and alert, oriented to place and time.  MOCA: Montreal Cognitive Assessment  07/12/2014  Visuospatial/ Executive (0/5) 5  Naming (0/3) 3  Attention: Read list of digits (0/2) 2  Attention: Read list of letters (0/1) 1  Attention: Serial 7 subtraction starting at 100 (0/3) 3  Language: Repeat phrase (0/2) 1  Language : Fluency (0/1) 1  Abstraction (0/2) 2  Delayed Recall (0/5) 2  Orientation (0/6) 6  Total 26  Adjusted Score (based on education) 26   MMSE:No flowsheet data found.     Attention span & concentration ability appears normal.  Speech is fluent,  without dysarthria, dysphonia or aphasia.  Mood and affect are appropriate.  Cranial nerves:Pupils are equal and briskly reactive to light. Extraocular movements  in vertical and horizontal planes intact and without nystagmus. Visual fields by finger perimetry are intact.Hearing to finger rub intact.  Facial sensation intact to fine touch. Facial motor strength is symmetric and tongue and uvula move midline. Shoulder shrug was symmetrical.  Motor exam:  Normal tone, muscle bulk and symmetric strength in all extremities. Sensory:  Fine touch, pinprick and vibration were intact in all extremities. Proprioception tested in the upper extremities was normal. Coordination: Rapid alternating movements in the fingers/hands and  Finger-to-nose maneuver  normal without evidence of ataxia, dysmetria or tremor. Gait and station: Patient walks without assistive device and is able unassisted to climb up to the exam table. Strength within normal limits.Stance is stable and normal. Turns with 3 Steps. Romberg testing is negative. Deep tendon reflexes: in the upper and lower extremities are brisk and symmetric . Babinski maneuver response is downgoing.   Assessment:  After physical and  neurologic examination, review of laboratory studies,  Personal review of imaging studies, reports of other /same  Imaging studies, results of polysomnography and / or neurophysiology testing and pre-existing records as far as provided in visit., my assessment is   1) Mrs. Forte - Leroux reports long-standing problems with, she has the ability to go to sleep struggles often to stay asleep.  This seems to be reflected in fatigue rather than daytime sleepiness.  She has been known to snore but now apnea was again witnessed after a medical procedure which involved anesthesia. She has been told before that she has apnea, has known this for over a decade.  Her body mass index, her neck circumference are normal but she does have a very narrow upper airway and retrognathia.  2) I will invite the patient for an attended sleep study but my goal is to not repeat the mistakes that may have been done in the past.  I first of all if she is claustrophobic I would like for her not to use a full facemask but the smallest possible interface.  In order to get her to tolerate CPAP she may also have to switch her to BiPAP.  I would very much prefer to have a reliable hypoxia and hypercapnia measurement, given that her fatigue score is so much higher than her daytime sleepiness- Classically more associated with gas exchange problems rather than frank apnea.   The patient was advised of the nature of the diagnosed disorder , the treatment options and the  risks for general health and wellness arising from not treating the condition.   I spent more than 45 minutes of face to face time with the patient.  Greater than 50% of time was spent in counseling and coordination of care. We have discussed the diagnosis and differential and I answered the patient's questions.  Plan:  Treatment plan and additional workup :  SPLIT with capnography, hypoxemia watch and titrate gently on nasal mask .  Patient has retrognathia- may  respond to a dental device.  Marland Kitchen   Larey Seat, MD 82/06/785, 7:54 PM  Certified in Neurology by ABPN Certified in [place] by [facility] [address] [place], [place] [address]

## 2017-11-03 ENCOUNTER — Other Ambulatory Visit: Payer: Self-pay | Admitting: Women's Health

## 2017-11-03 DIAGNOSIS — F411 Generalized anxiety disorder: Secondary | ICD-10-CM

## 2017-11-03 NOTE — Telephone Encounter (Signed)
Called into pharmacy

## 2017-11-03 NOTE — Telephone Encounter (Signed)
Ok for refill? 

## 2017-11-24 ENCOUNTER — Encounter

## 2017-11-30 ENCOUNTER — Ambulatory Visit (INDEPENDENT_AMBULATORY_CARE_PROVIDER_SITE_OTHER): Admitting: Neurology

## 2017-11-30 DIAGNOSIS — Z9889 Other specified postprocedural states: Secondary | ICD-10-CM | POA: Diagnosis not present

## 2017-11-30 DIAGNOSIS — M2619 Other specified anomalies of jaw-cranial base relationship: Secondary | ICD-10-CM

## 2017-11-30 DIAGNOSIS — R0902 Hypoxemia: Secondary | ICD-10-CM

## 2017-11-30 DIAGNOSIS — Z9189 Other specified personal risk factors, not elsewhere classified: Secondary | ICD-10-CM

## 2017-11-30 DIAGNOSIS — R0689 Other abnormalities of breathing: Secondary | ICD-10-CM

## 2017-12-11 NOTE — Procedures (Signed)
PATIENT'S NAME:  Dudley, Cooley DOB:      Mar 28, 1956      MR#:    [id number]     DATE OF RECORDING: 11/30/2017 REFERRING M.D.:  Kathlene November, MD Study Performed:   Baseline Polysomnogram HISTORY:   The patient reports that she underwent at least 2 sleep studies, one at t [facility] annex, another one at Dr. Mahalia Longest private sleep lab on [place].  She continues to feel that her sleep has a low quality, is not sound and still struggles with insomnia.  She also reports memory loss, respiratory allergies, snoring, daytime fatigue. Patient had gallbladder surgery just earlier this year. It was right after the procedure under anesthesia that she was again witnessed to trouble with apnea, very low oxygen saturations, and was asked to follow-up here again. The anesthesiologist made it urgent.  The patient endorsed the Epworth Sleepiness Scale at 3/24 points.  The patient's weight 143 pounds with a height of 62 (inches), resulting in a BMI of 26.4 kg/m2.The patient's neck circumference measured 13.5 inches.  CURRENT MEDICATIONS: Alprazolam   PROCEDURE:  This is a multichannel digital Polysomnogram utilizing the Somnostar 11.2 system.  Electrodes and sensors were applied and monitored per AASM Specifications.   EEG, EOG, Chin and Limb EMG, were sampled at 200 Hz.  ECG, Snore and Nasal Pressure, Thermal Airflow, Respiratory Effort, CPAP Flow and Pressure, Oximetry was sampled at 50 Hz. Digital video and audio were recorded.      BASELINE STUDY Lights Out was at 22:21 and Lights On at 05:13.  Total recording time (TRT) was 412.5 minutes, with a total sleep time (TST) of 274 minutes.   The patient's sleep latency was 52.5 minutes.  REM latency was 171.5 minutes.  The sleep efficiency was 66.4 %.     SLEEP ARCHITECTURE: WASO (Wake after sleep onset) was 111 minutes.  There were 15 minutes in Stage N1, 216.5 minutes Stage N2, 12 minutes Stage N3 and 30.5 minutes in Stage REM.  The percentage of  Stage N1 was 5.5%, Stage N2 was 79.%, Stage N3 was 4.4% and Stage R (REM sleep) was 11.1%.   RESPIRATORY ANALYSIS:  There were a total of 5 respiratory events; 0 apneas and 5 hypopneas with 0 respiratory event related arousals (RERAs).     The total APNEA/HYPOPNEA INDEX (AHI) was 1.1/hour and the total RESPIRATORY DISTURBANCE INDEX was 1.1 /hour. 2 events occurred in REM sleep and 6 events in NREM. The REM AHI was 3.9 /hour, versus a non-REM AHI of 0.7. The patient spent 27.5 minutes of total sleep time in the supine position and 247 minutes in non-supine. The supine AHI was 0.0 versus a non-supine AHI of 1.2.  OXYGEN SATURATION & C02:  The Wake baseline 02 saturation was 95%, with the lowest being 81%. Time spent below 89% saturation equaled 0.5 minutes. During REM, the average End Tidal CO2  was 21.6 torr.  During NREM, CO2 was 21.1 torr.  Total sleep time greater than 40 torr was 0.20 minutes.   PERIODIC LIMB MOVEMENTS:   The patient had a total of 40 Periodic Limb Movements.  The Periodic Limb Movement (PLM) index was 8.8 and the PLM Arousal index was 3.5/hour. The arousals were noted as: 28 were spontaneous, 16 were associated with PLMs, and 5 were associated with respiratory events.  Audio and video analysis did not show any abnormal or unusual movements, behaviors, phonations or vocalizations.  There was frequent leg movement. The patient took one bathroom  break. Intermittent mild Snoring was noted. EKG was in keeping with normal sinus rhythm (NSR).  Post-study, the patient indicated that sleep was the same as usual, except for more noise in the lab environment.   IMPRESSION: PLM disorder   1. No evidence of Central or Obstructive Sleep Apnea(OSA), hypoxemia or hypercapnia . 2. Moderate Periodic Limb Movement Disorder (PLMD)  RECOMMENDATIONS:  1. Many periodic limb movements of sleep (PLMS) with associated sleep disruption.  Avoid caffeine-containing beverages and  chocolate. 2. Correlate clinically for a history consistent with regarding restless legs syndrome (RLS).    Consider secondary restless legs syndrome.  Pharmacotherapy may be warranted.  Obtain a serum ferritin level if the clinical history is consistent with RLS.  3.  Consider iron therapy and evaluation for iron deficiency anemia if the serum ferritin level < 50 ng/ml.  Certain medications or substances may aggravate RLS and common offenders may include the following:  nicotine, caffeine, SSRIs, TCAs, phenothiazine, dopamine antagonists, diphenhydramine, and alcohol.   4. Consider dedicated sleep psychology referral if insomnia is of clinical concern.   5. A follow up appointment can be scheduled in the Sleep Clinic at [facility]. The referring provider will be notified of the results.      I certify that I have reviewed the entire raw data recording prior to the issuance of this report in accordance with the Standards of Accreditation of the American Academy of Sleep Medicine (AASM)   Larey Seat, MD     12-11-2017  Diplomat, American Board of Psychiatry and Neurology  Diplomat, American Board of [place] Director, [place] [facility] [employer]

## 2017-12-12 ENCOUNTER — Telehealth: Payer: Self-pay | Admitting: Neurology

## 2017-12-12 NOTE — Telephone Encounter (Signed)
-----   Message from Larey Seat, MD sent at 12/11/2017  3:46 PM EST ----- No significant sleep apnea, snoring, hypoxemia or hypercapnia found.  Sleep was disturbed by PLM arousals.  Will offer  work up for PLMs/ RLS - possible overlap.

## 2017-12-12 NOTE — Telephone Encounter (Signed)
Called patient to discuss sleep study results. No answer at this time. LVM for the patient to call back.   

## 2017-12-17 ENCOUNTER — Telehealth: Payer: Self-pay | Admitting: Neurology

## 2017-12-17 NOTE — Telephone Encounter (Signed)
Pt returned call and I went over the sleep study results with her. I explained there was minimal apnea seen and was not indicative of sleep apnea. Pt is concerned with why she has been told before that she has sleep apnea. I informed her that we are just going off of this sleep test and this was not indicative of sleep apnea. I offered a apt for the patient so that she can discuss this sleep study in further detail as well as her concerns with Dr Brett Fairy. I also informed her that we could work her up for RLS at this apt if she chooses to do so. Pt verbalized understanding.

## 2018-01-19 NOTE — Telephone Encounter (Signed)
Error

## 2018-02-03 ENCOUNTER — Encounter: Payer: Self-pay | Admitting: Neurology

## 2018-02-03 ENCOUNTER — Ambulatory Visit (INDEPENDENT_AMBULATORY_CARE_PROVIDER_SITE_OTHER): Admitting: Family Medicine

## 2018-02-03 ENCOUNTER — Ambulatory Visit (INDEPENDENT_AMBULATORY_CARE_PROVIDER_SITE_OTHER): Admitting: Neurology

## 2018-02-03 ENCOUNTER — Encounter: Payer: Self-pay | Admitting: Family Medicine

## 2018-02-03 VITALS — BP 109/67 | HR 84 | Ht 62.0 in | Wt 145.0 lb

## 2018-02-03 VITALS — BP 100/60 | HR 72 | Temp 98.3°F | Resp 16 | Ht 62.0 in | Wt 146.8 lb

## 2018-02-03 DIAGNOSIS — G4700 Insomnia, unspecified: Secondary | ICD-10-CM | POA: Diagnosis not present

## 2018-02-03 DIAGNOSIS — B354 Tinea corporis: Secondary | ICD-10-CM

## 2018-02-03 DIAGNOSIS — D649 Anemia, unspecified: Secondary | ICD-10-CM | POA: Diagnosis not present

## 2018-02-03 DIAGNOSIS — D508 Other iron deficiency anemias: Secondary | ICD-10-CM | POA: Diagnosis not present

## 2018-02-03 DIAGNOSIS — G478 Other sleep disorders: Secondary | ICD-10-CM

## 2018-02-03 MED ORDER — NAFTIFINE HCL 1 % EX CREA: TOPICAL_CREAM | Freq: Every day | CUTANEOUS | 0 refills | Status: DC

## 2018-02-03 NOTE — Progress Notes (Signed)
Patient ID: Debra Norton, female   DOB: 24-Jan-1956, 62 y.o.   MRN: [id number]    Subjective:  I acted as a Education administrator for Dr. Carollee Herter.  Guerry Bruin, [place]   Patient ID: Debra Norton, female    DOB: 04/22/1956, 62 y.o.   MRN: [id number]  Chief Complaint  Patient presents with  . ring worm on face    HPI  Patient is in today for possible ring worm on left side of face.  Noticed almost 1 week ago.  Has tried all different types of antifungal creams.  Patient Care Team: Colon Branch, MD as PCP - General Annamaria Boots Candiss Norse, NP as Nurse Practitioner (Obstetrics and Gynecology) Rolm Bookbinder, MD as Consulting Physician (General Surgery)   Past Medical History:  Diagnosis Date  . Anxiety   . Cervical dysplasia   . Cholelithiasis    50mm stone seen on ultrasound in 2009  . Migraine headache   . Vertigo     Past Surgical History:  Procedure Laterality Date  . COLPOSCOPY    . ECTOPIC PREGNANCY SURGERY    . EXTERNAL EAR SURGERY    . GYNECOLOGIC CRYOSURGERY      Family History  Problem Relation Age of Onset  . Colon cancer Mother        in her 35s  . Diabetes Sister   . Ovarian cancer Cousin        Mat. 1st cousin  . Breast cancer Neg Hx   . CAD Neg Hx     Social History   Socioeconomic History  . Marital status: Widowed    Spouse name: Not on file  . Number of children: 0  . Years of education: Not on file  . Highest education level: Not on file  Social Needs  . Financial resource strain: Not on file  . Food insecurity - worry: Not on file  . Food insecurity - inability: Not on file  . Transportation needs - medical: Not on file  . Transportation needs - non-medical: Not on file  Occupational History  . Occupation: active at work    Fish farm manager: UNEMPLOYED  Tobacco Use  . Smoking status: Former Research scientist (life sciences)  . Smokeless tobacco: Never Used  . Tobacco comment: quit in high School  Substance and Sexual Activity  . Alcohol use: Yes    Alcohol/week: 1.2 oz      Types: 2 Standard drinks or equivalent per week    Comment: socially  . Drug use: No  . Sexual activity: Yes    Comment: INTERCOURSE AGE 30, SEXUAL PARTNERS  MORE THAN 5  Other Topics Concern  . Not on file  Social History Narrative   Lives with boy friend      G4 P0    miscarriage x 3    ectopic x 1     Outpatient Medications Prior to Visit  Medication Sig Dispense Refill  . ALPRAZolam (XANAX) 0.25 MG tablet TAKE 1 TABLET BY MOUTH AT BEDTIME AS NEEDED FOR SLEEP 30 tablet 1   No facility-administered medications prior to visit.     Allergies  Allergen Reactions  . Diphenhydramine Hcl     REACTION: VERY WIRED/palpitations  Benadryl  . Penicillins     REACTION: HIVES  . Tetanus Toxoid     REACTION: REALLY BAD SWELLING  . Clindamycin/Lincomycin Rash    Rash shortly after she finished a round of abx , see OV note 05-10-15  . Hydrocodone     Insomnia, itching (mild)  Review of Systems  Constitutional: Negative for fever and malaise/fatigue.  HENT: Negative for congestion.   Eyes: Negative for blurred vision.  Respiratory: Negative for cough and shortness of breath.   Cardiovascular: Negative for chest pain, palpitations and leg swelling.  Gastrointestinal: Negative for vomiting.  Musculoskeletal: Negative for back pain.  Skin: Positive for itching and rash.  Neurological: Negative for loss of consciousness and headaches.       Objective:    Physical Exam  HENT:  Head:    Nursing note and vitals reviewed.     BP 100/60 (BP Location: Left Arm, Cuff Size: Normal)   Pulse 72   Temp 98.3 F (36.8 C) (Oral)   Resp 16   Ht 5\' 2"  (1.575 m)   Wt 146 lb 12.8 oz (66.6 kg)   SpO2 97%   BMI 26.85 kg/m  Wt Readings from Last 3 Encounters:  02/03/18 146 lb 12.8 oz (66.6 kg)  02/03/18 145 lb (65.8 kg)  10/20/17 143 lb (64.9 kg)   BP Readings from Last 3 Encounters:  02/03/18 100/60  02/03/18 109/67  10/20/17 130/78     There is no immunization  history for the selected administration types on file for this patient.  Health Maintenance  Topic Date Due  . Hepatitis C Screening  03-04-1956  . HIV Screening  09/30/1972  . INFLUENZA VACCINE  07/16/2017  . PAP SMEAR  06/06/2018  . MAMMOGRAM  06/19/2018  . COLONOSCOPY  07/03/2020    Lab Results  Component Value Date   WBC 4.7 07/30/2017   HGB 13.3 07/30/2017   HCT 40.5 07/30/2017   PLT 250.0 07/30/2017   GLUCOSE 103 (H) 07/30/2017   CHOL 191 02/18/2017   TRIG 76 02/18/2017   HDL 63 02/18/2017   LDLDIRECT 146.5 06/14/2010   LDLCALC 113 (H) 02/18/2017   ALT 13 07/30/2017   AST 19 07/30/2017   NA 140 07/30/2017   K 3.8 07/30/2017   CL 107 07/30/2017   CREATININE 0.72 07/30/2017   BUN 13 07/30/2017   CO2 26 07/30/2017   TSH 4.17 06/17/2017    Lab Results  Component Value Date   TSH 4.17 06/17/2017   Lab Results  Component Value Date   WBC 4.7 07/30/2017   HGB 13.3 07/30/2017   HCT 40.5 07/30/2017   MCV 91.5 07/30/2017   PLT 250.0 07/30/2017   Lab Results  Component Value Date   NA 140 07/30/2017   K 3.8 07/30/2017   CO2 26 07/30/2017   GLUCOSE 103 (H) 07/30/2017   BUN 13 07/30/2017   CREATININE 0.72 07/30/2017   BILITOT 0.5 07/30/2017   ALKPHOS 62 07/30/2017   AST 19 07/30/2017   ALT 13 07/30/2017   PROT 6.5 07/30/2017   ALBUMIN 4.3 07/30/2017   CALCIUM 9.3 07/30/2017   GFR 87.87 07/30/2017   Lab Results  Component Value Date   CHOL 191 02/18/2017   Lab Results  Component Value Date   HDL 63 02/18/2017   Lab Results  Component Value Date   LDLCALC 113 (H) 02/18/2017   Lab Results  Component Value Date   TRIG 76 02/18/2017   Lab Results  Component Value Date   CHOLHDL 3.0 02/18/2017   No results found for: HGBA1C       Assessment & Plan:   Problem List Items Addressed This Visit    None    Visit Diagnoses    Tinea corporis    -  Primary   Relevant Medications  naftifine (NAFTIN) 1 % cream      I am having Georgian Co.  Forte-Leroux start on naftifine. I am also having her maintain her ALPRAZolam.  Meds ordered this encounter  Medications  . naftifine (NAFTIN) 1 % cream    Sig: Apply topically daily.    Dispense:  30 g    Refill:  0    CMA served as scribe during this visit. History, Physical and Plan performed by medical provider. Documentation and orders reviewed and attested to.  Ann Held, DO

## 2018-02-03 NOTE — Progress Notes (Signed)
SLEEP MEDICINE CLINIC   Provider:  Larey Seat, M D  Primary Care Physician:  Colon Branch, MD   Referring Provider: Colon Branch, MD   Chief Complaint  Patient presents with  . New Patient (Initial Visit)    pt with friend, rm 63. pt had surgery and her O2 was low. pt has had 2 sleep studies completed. last one was 8-10 years ago. pt said it showed sleep apnea but pt was unable to wear it. pt has been told that she snores and stops breathing in sleep    HPI:  Debra Norton is a 61 y.o. female , seen here as in a referral from Dr. Larose Kells for a sleep evaluation, The patient reports that she underwent at least 2 sleep studies, one at the [facility], another one at Dr. [facility]alia [facility] private sleep lab [facility]. She continues to feel that her sleep has a low quality is not sound and struggles with insomnia.  She also reports memory loss respiratory allergies, snoring, daytime fatigue recent surgeries she had a gallbladder surgery just earlier this year. It was right after the procedure under anesthesia that she was again witnessed to trouble with apnea, very low oxygen saturations, and was asked to follow-up here again. the anesthesiologist made it urgent     Chief complaint according to patient :" I snore" and I wake up from it" - "I sleep restless"  Sleep habits are as follows: She spends her evenings mostly watching TV for the last hours before she retreats to bed.  Bedtime is around 10 PM to 11 PM.  The bedroom is cool, quiet and dark in conducive to sleep.  She mostly sleeps alone , she likes to sleep on her side.  When she falls asleep she sleeps with 2 pillows. She usually dreams of the later morning hours before she wakes up.  Her dreams are not of nightmares character. He usually does not have nocturia and if only once. Her alarm is set for 6:30 AM but she already is awake at 4 AM and struggles to get another hour of sleep. She is tired in AM, no dry  mouth, but headaches. She does not nap during the day but may fall asleep watching TV before going to bed.  On weekends she sometimes takes an hour nap  Sleep medical history and family sleep history: She is not aware of any family members having obstructive sleep apnea or having been treated for this condition.  She was not a sleep walker.  Not undergo tonsillectomy, she has no history of traumatic brain injuries,ENT surgeries.  Social history: lives with boyfriend. Lives with a dog. She runs a Environmental consultant.  Smoked briefly during her high school years, does not use any other form of tobacco now.  She may drink 1-2 drinks a week.  Caffeine she will drink tea in the morning, no sodas or energy drinks- sometimes coffee. She would drink  2 times 12 ounces of hot tea or coffee in AM.    02-03-2018, RV after PSG -  I have the pleasure of meeting today with Mrs. Debra Norton- Debra Norton in a sleep clinic revisit following up on a sleep study from 30 November 2017.  The patient had no significant apnea her AHI was 1.1/h of sleep which is  physiologically acceptable.  She did not retain CO2, she did not develop hypoxemia, she had significant periodic limb movements which led to arousals at 3.5 times  per hour of sleep.   There were multiple spontaneous arousals which did not correlate to any physiologic function be measured. She continues to report insomnia-  In spite of new mattress, soft music, and a cool and dark room, etc. Usually goes to bed at 10 PM - I discussed sleep restriction.   *Mrs Debra Norton has longstanding dizziness complaint. She has this second concern of dizziness to address today, and she notices that dizziness is worse when laying down. ENT had not found  "anything ". She was referred to a neurologist for EEG 10 years ago - normal.  She states that her roommate has noticed abnormal eye movements, but she denies feeling in motion when she is actually not all that the room is spinning  around her.  These will be a classic vertigo manifestations.  She reports that she feels these as very brief sudden spells she wants to hold onto something avoiding to fall over, the little bit scary, but she has never lost consciousness or awareness.  These spells also do not follow an emotional trigger, they can happen anytime of the day, they are unrelated to meal times, they are not associated with loss of vision or slurring of speech peripheral change in sensation.  She feels as if she could suddenly fall and never has.   Review of Systems: Out of a complete 14 system review, the patient complains of only the following symptoms, and all other reviewed systems are negative.  Epworth sleepiness score was endorsed at 10  points, fatigue severity scale at 46 from 56 points, geriatric depression score at  6 out of 15 point  Sudden dizziness spells. Headaches in AM, poor sleep.  Insomnia. Falls asleep with a latency of 60 minutes and cannot stay asleep.  Dreamless sleep.   Social History   Socioeconomic History  . Marital status: Widowed    Spouse name: Not on file  . Number of children: 0  . Years of education: Not on file  . Highest education level: Not on file  Social Needs  . Financial resource strain: Not on file  . Food insecurity - worry: Not on file  . Food insecurity - inability: Not on file  . Transportation needs - medical: Not on file  . Transportation needs - non-medical: Not on file  Occupational History  . Occupation: active at work    Fish farm manager: UNEMPLOYED  Tobacco Use  . Smoking status: Former Research scientist (life sciences)  . Smokeless tobacco: Never Used  . Tobacco comment: quit in high School  Substance and Sexual Activity  . Alcohol use: Yes    Alcohol/week: 1.2 oz    Types: 2 Standard drinks or equivalent per week    Comment: socially  . Drug use: No  . Sexual activity: Yes    Comment: INTERCOURSE AGE 32, SEXUAL PARTNERS  MORE THAN 5  Other Topics Concern  . Not on file  Social  History Narrative   Lives with boy friend      G4 P0    miscarriage x 3    ectopic x 1     Family History  Problem Relation Age of Onset  . Colon cancer Mother        in her 44s  . Diabetes Sister   . Ovarian cancer Cousin        Mat. 1st cousin  . Breast cancer Neg Hx   . CAD Neg Hx     Past Medical History:  Diagnosis Date  . Anxiety   .  Cervical dysplasia   . Cholelithiasis    73mm stone seen on ultrasound in 2009  . Migraine headache   . Vertigo     Past Surgical History:  Procedure Laterality Date  . COLPOSCOPY    . ECTOPIC PREGNANCY SURGERY    . EXTERNAL EAR SURGERY    . GYNECOLOGIC CRYOSURGERY      Current Outpatient Medications  Medication Sig Dispense Refill  . ALPRAZolam (XANAX) 0.25 MG tablet TAKE 1 TABLET BY MOUTH AT BEDTIME AS NEEDED FOR SLEEP 30 tablet 0   No current facility-administered medications for this visit.     Allergies as of 10/20/2017 - Review Complete 10/20/2017  Allergen Reaction Noted  . Diphenhydramine hcl  06/26/2010  . Penicillins    . Tetanus toxoid    . Clindamycin/lincomycin Rash 05/10/2015  . Hydrocodone  09/01/2012    Vitals: BP 130/78   Pulse 73   Ht 5\' 2"  (1.575 m)   Wt 143 lb (64.9 kg)   BMI 26.16 kg/m  Last Weight:  Wt Readings from Last 1 Encounters:  10/20/17 143 lb (64.9 kg)   ZDG:LOVF mass index is 26.16 kg/m.     Last Height:   Ht Readings from Last 1 Encounters:  10/20/17 5\' 2"  (1.575 m)    Physical exam:  General: The patient is awake, alert and appears not in acute distress. The patient is well groomed. Head: Normocephalic, atraumatic. Neck is supple. Mallampati 4 ,  neck circumference:13.5 . Nasal airflow patent , . Retrognathia is seen.  Cardiovascular:  Regular rate and rhythm  without  murmurs or carotid bruit, and without distended neck veins. Respiratory: Lungs are clear to auscultation. Skin:  Without evidence of edema, or rash Trunk: BMI 26.16. The patient's posture is  erect  Neurologic exam : The patient is awake and alert, oriented to place and time.     MOCA: Montreal Cognitive Assessment  07/12/2014  Visuospatial/ Executive (0/5) 5  Naming (0/3) 3  Attention: Read list of digits (0/2) 2  Attention: Read list of letters (0/1) 1  Attention: Serial 7 subtraction starting at 100 (0/3) 3  Language: Repeat phrase (0/2) 1  Language : Fluency (0/1) 1  Abstraction (0/2) 2  Delayed Recall (0/5) 2  Orientation (0/6) 6  Total 26  Adjusted Score (based on education) 26    Attention span & concentration ability appears normal.  Speech is fluent,  without dysarthria, dysphonia or aphasia.  Mood and affect are appropriate.  Cranial nerves: Pupils are equal and briskly reactive to light. Extraocular movements  in vertical and horizontal planes intact and without nystagmus. Visual fields by finger perimetry are intact.Hearing to finger rub intact.  Facial sensation intact to fine touch. Facial motor strength is symmetric and tongue and uvula move midline. Shoulder shrug was symmetrical.  Motor exam:  Normal tone, muscle bulk and symmetric strength in all extremities. Sensory:  Fine touch, pinprick and vibration were intact in all extremities. Proprioception tested in the upper extremities was normal. Coordination: Rapid alternating movements in the fingers/hands and  Finger-to-nose maneuver  normal without evidence of ataxia, dysmetria or tremor. Gait and station: Patient walks without assistive device and is able unassisted to climb up to the exam table. Strength within normal limits.Stance is stable and normal. Turns with 3 Steps. Romberg testing is negative. Deep tendon reflexes: in the upper and lower extremities are brisk and symmetric . Babinski maneuver response is downgoing.   Assessment:  After physical and neurologic examination, review of laboratory  studies,  Personal review of imaging studies, reports of other /same  Imaging studies, results of  polysomnography and / or neurophysiology testing and pre-existing records as far as provided in visit., my assessment is   1) Mrs. Forte - Leroux reports long-standing problems with, she has the ability to go to sleep struggles often to stay asleep.  This seems to be reflected in fatigue rather than daytime sleepiness.  Her study did not reveal apnea, not hypoxia.   2)RLS with PLMs? She noted some jerking movements at night but not classic RLS-she does not have the irresistible urge to move leave the bed, massage her legs etc. there is no pain or discomfort.  Insomnia trouble to initiate sleep, trouble to stay asleep.  3) she reports sleep latency is between 60 and 90 minutes which are frustrating to her, she feels that she has already done a lot in terms of sleep hygiene and routines.  She still wakes up at 4 AM long before she needs to wake up.  Sleep efficiency at home seems to be around 60% just as it was in the sleep lab.  I provided the patient with a sleep restriction instruction.  I would like her to go to bed after a 30-minute routine that should become a ritual and not varied from.  She will now have a bedtime that is more likely to The sleep latency around 11 or even 1130.  I would like you to adhere for it for 14 days straight including weekends.  She has a set rise time in the morning if these routines have consolidated her sleep she should be able after 14 days to advance her bedtime by 30 minutes after another 14 days she may go down another 30 minutes.  I also advised the patient that there is no guideline of how much sleep in individual needs.  She may truly function well on 6 hours of sleep while others need 9.   More important for her is that she is not frustrated by her inability to go to sleep and stay asleep and associates this with time in the bedroom or time in bed. Refer to cognitive behavior therapy if this is not successful. Melatonin is allowed, can be taker up to 2 hours before  bedtime.   The patient was advised of the nature of the diagnosed disorder , the treatment options and the  risks for general health and wellness arising from not treating the condition.   I spent more than 30 minutes of face to face time with the patient.  Greater than 50% of time was spent in counseling and coordination of care. We have discussed the diagnosis and differential and I answered the patient's questions.    Plan:  Treatment plan and additional workup : PLMs, nor RLS- she is not very bothered by this but has a hitsoyr of " low iron"- lets see what her ferritin level is.  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   RV prn in 2-3 month with NP.    Larey Seat, MD 31/04/1760, 6:07 PM  Certified in Neurology by ABPN Certified in [place] by [facility] [address] [place], [place] [address]

## 2018-02-03 NOTE — Patient Instructions (Signed)

## 2018-02-04 ENCOUNTER — Telehealth: Payer: Self-pay

## 2018-02-04 LAB — IRON AND TIBC
Iron Saturation: 23 % (ref 15–55)
Iron: 72 ug/dL (ref 27–159)
Total Iron Binding Capacity: 311 ug/dL (ref 250–450)
UIBC: 239 ug/dL (ref 131–425)

## 2018-02-04 LAB — FERRITIN: FERRITIN: 115 ng/mL (ref 15–150)

## 2018-02-04 NOTE — Telephone Encounter (Signed)
-----   Message from Larey Seat, MD sent at 02/04/2018  8:41 AM EST ----- Normal iron metabolism - no longer iron deficient.

## 2018-02-04 NOTE — Telephone Encounter (Signed)
I spoke with patient and she is aware of results by phone.

## 2018-03-30 ENCOUNTER — Encounter: Payer: Self-pay | Admitting: Internal Medicine

## 2018-03-30 ENCOUNTER — Ambulatory Visit (INDEPENDENT_AMBULATORY_CARE_PROVIDER_SITE_OTHER): Admitting: Internal Medicine

## 2018-03-30 VITALS — BP 116/78 | HR 82 | Temp 98.1°F | Resp 14 | Ht 62.0 in | Wt 148.5 lb

## 2018-03-30 DIAGNOSIS — J40 Bronchitis, not specified as acute or chronic: Secondary | ICD-10-CM | POA: Diagnosis not present

## 2018-03-30 DIAGNOSIS — J019 Acute sinusitis, unspecified: Secondary | ICD-10-CM | POA: Diagnosis not present

## 2018-03-30 MED ORDER — AZITHROMYCIN 250 MG PO TABS: ORAL_TABLET | ORAL | 0 refills | Status: DC

## 2018-03-30 NOTE — Progress Notes (Signed)
Subjective:    Patient ID: Debra Norton, female    DOB: 07/09/1956, 62 y.o.   MRN: [id number]  DOS:  03/30/2018 Type of visit - description : acute Interval history: Symptoms of started 3 days ago with sore throat, head congestion, facial pain mostly in the maxillary areas. + Cough with green sputum. Taking Mucinex day/night. Has  been exposed to sick contacts, works at [employer].  Review of Systems Denies fever chills No chest pain, mild difficulty breathing. + Runny nose with a small amount of green nasal discharge.   Past Medical History:  Diagnosis Date  . Anxiety   . Cervical dysplasia   . Cholelithiasis    19mm stone seen on ultrasound in 2009  . Migraine headache   . Vertigo     Past Surgical History:  Procedure Laterality Date  . COLPOSCOPY    . ECTOPIC PREGNANCY SURGERY    . EXTERNAL EAR SURGERY    . GYNECOLOGIC CRYOSURGERY      Social History   Socioeconomic History  . Marital status: Widowed    Spouse name: Not on file  . Number of children: 0  . Years of education: Not on file  . Highest education level: Not on file  Occupational History  . Occupation: active at work    Fish farm manager: UNEMPLOYED  Social Needs  . Financial resource strain: Not on file  . Food insecurity:    Worry: Not on file    Inability: Not on file  . Transportation needs:    Medical: Not on file    Non-medical: Not on file  Tobacco Use  . Smoking status: Former Research scientist (life sciences)  . Smokeless tobacco: Never Used  . Tobacco comment: quit in high School  Substance and Sexual Activity  . Alcohol use: Yes    Alcohol/week: 1.2 oz    Types: 2 Standard drinks or equivalent per week    Comment: socially  . Drug use: No  . Sexual activity: Yes    Comment: INTERCOURSE AGE 79, SEXUAL PARTNERS  MORE THAN 5  Lifestyle  . Physical activity:    Days per week: Not on file    Minutes per session: Not on file  . Stress: Not on file  Relationships  . Social connections:   Talks on phone: Not on file    Gets together: Not on file    Attends religious service: Not on file    Active member of club or organization: Not on file    Attends meetings of clubs or organizations: Not on file    Relationship status: Not on file  . Intimate partner violence:    Fear of current or ex partner: Not on file    Emotionally abused: Not on file    Physically abused: Not on file    Forced sexual activity: Not on file  Other Topics Concern  . Not on file  Social History Narrative   Lives with boy friend      G4 P0    miscarriage x 3    ectopic x 1       Allergies as of 03/30/2018      Reactions   Diphenhydramine Hcl    REACTION: VERY WIRED/palpitations  Benadryl   Penicillins    REACTION: HIVES   Tetanus Toxoid    REACTION: REALLY BAD SWELLING   Clindamycin/lincomycin Rash   Rash shortly after she finished a round of abx , see OV note 05-10-15   Hydrocodone  Insomnia, itching (mild)      Medication List        Accurate as of 03/30/18  2:22 PM. Always use your most recent med list.          ALPRAZolam 0.25 MG tablet Commonly known as:  XANAX TAKE 1 TABLET BY MOUTH AT BEDTIME AS NEEDED FOR SLEEP   naftifine 1 % cream Commonly known as:  NAFTIN Apply topically daily.          Objective:   Physical Exam BP 116/78 (BP Location: Left Arm, Patient Position: Sitting, Cuff Size: Small)   Pulse 82   Temp 98.1 F (36.7 C) (Oral)   Resp 14   Ht 5\' 2"  (1.575 m)   Wt 148 lb 8 oz (67.4 kg)   SpO2 98%   BMI 27.16 kg/m  General:   Well developed, well nourished . NAD.  HEENT:  Normocephalic . Face symmetric, atraumatic. TMs normal, throat symmetric, no red. Nose congested, + tenderness at the right maxillary area, not TTP@ the left. Lungs:  CTA B Normal respiratory effort, no intercostal retractions, no accessory muscle use. Heart: RRR,  no murmur.  No pretibial edema bilaterally  Skin: Not pale. Not jaundice Neurologic:  alert & oriented X3.    Speech normal, gait appropriate for age and unassisted Psych--  Cognition and judgment appear intact.  Cooperative with normal attention span and concentration.  Behavior appropriate. No anxious or depressed appearing.      Assessment & Plan:    Assessment H/o dizziness: 2016, saw neurology, EEG was negative. H/o cervical dysplasia H/o OSA ( Not on CPAP, states the second sleep study did not show any improvement with CPAP use)  PLAN: Bronchitis, maxillary sinusitis: Suspect she has bronchitis/sinusitis; she is  allergic to penicillin, will treat with Zithromax and supportive care, see AVS. She reported mild shortness of breath, vital signs are stable, she does not look toxic or tachypneic.  Recommend to call promptly if not improving.

## 2018-03-30 NOTE — Patient Instructions (Signed)
Rest, fluids , tylenol  For cough:  Continue with Mucinex  For nasal congestion: Use OTC   Flonase : 2 nasal sprays on each side of the nose in the morning until you feel better   Take the antibiotic as prescribed, Zithromax  Call if not gradually better over the next  10 days  Call anytime if the symptoms are severe, you have high fever, short of breath, chest pain

## 2018-03-30 NOTE — Progress Notes (Signed)
Pre visit review using our clinic review tool, if applicable. No additional management support is needed unless otherwise documented below in the visit note. 

## 2018-03-31 NOTE — Assessment & Plan Note (Signed)
Bronchitis, maxillary sinusitis: Suspect she has bronchitis/sinusitis; she is  allergic to penicillin, will treat with Zithromax and supportive care, see AVS. She reported mild shortness of breath, vital signs are stable, she does not look toxic or tachypneic.  Recommend to call promptly if not improving.

## 2018-09-15 ENCOUNTER — Ambulatory Visit (HOSPITAL_BASED_OUTPATIENT_CLINIC_OR_DEPARTMENT_OTHER)
Admission: RE | Admit: 2018-09-15 | Discharge: 2018-09-15 | Disposition: A | Discharge status: Home / Self Care | Source: Ambulatory Visit | Attending: Internal Medicine | Admitting: Internal Medicine

## 2018-09-15 ENCOUNTER — Ambulatory Visit (INDEPENDENT_AMBULATORY_CARE_PROVIDER_SITE_OTHER): Admitting: Internal Medicine

## 2018-09-15 ENCOUNTER — Encounter: Payer: Self-pay | Admitting: Internal Medicine

## 2018-09-15 VITALS — BP 118/64 | HR 117 | Temp 100.8°F | Resp 16 | Ht 62.0 in | Wt 148.2 lb

## 2018-09-15 DIAGNOSIS — R509 Fever, unspecified: Secondary | ICD-10-CM | POA: Diagnosis present

## 2018-09-15 DIAGNOSIS — J029 Acute pharyngitis, unspecified: Secondary | ICD-10-CM

## 2018-09-15 DIAGNOSIS — Z09 Encounter for follow-up examination after completed treatment for conditions other than malignant neoplasm: Secondary | ICD-10-CM

## 2018-09-15 LAB — POCT INFLUENZA A/B
INFLUENZA A, POC: NEGATIVE
Influenza B, POC: NEGATIVE

## 2018-09-15 MED ORDER — OSELTAMIVIR PHOSPHATE 75 MG PO CAPS: 75.0000 mg | ORAL_CAPSULE | Freq: Two times a day (BID) | ORAL | 0 refills | Status: DC

## 2018-09-15 NOTE — Progress Notes (Signed)
Pre visit review using our clinic review tool, if applicable. No additional management support is needed unless otherwise documented below in the visit note. 

## 2018-09-15 NOTE — Progress Notes (Signed)
Subjective:    Patient ID: Debra Norton, female    DOB: 1956/02/05, 62 y.o.   MRN: [id number]  DOS:  09/15/2018 Type of visit - description : acute Interval history: Not feeling well for the last 3 weeks: Initially had a temperature of 101.2, stuffy head, sore throat, headache. Symptoms were gradually improving, never filled 100% well. For instance, 3 days ago Saturday she spent most of the day in bed due to malaise and fatigue. Today, she fell acutely worse: Nausea, "freezing", generalized aches and pains, myalgias, "pain in the bones". Her temperature was 100.2 this morning. She works at [employer], multiple college student sick (including mumps)  Review of Systems Has been having chills on and off Mild cough On exam today she was a slightly tender at the upper abdomen, has not experienced abdominal pain at home, no vomiting, diarrhea. Appetite is decreased. She has a moderate headache since the onset of the problem. No dysuria or gross hematuria   Past Medical History:  Diagnosis Date  . Anxiety   . Cervical dysplasia   . Cholelithiasis    57mm stone seen on ultrasound in 2009  . Migraine headache   . Vertigo     Past Surgical History:  Procedure Laterality Date  . COLPOSCOPY    . ECTOPIC PREGNANCY SURGERY    . EXTERNAL EAR SURGERY    . GYNECOLOGIC CRYOSURGERY      Social History   Socioeconomic History  . Marital status: Widowed    Spouse name: Not on file  . Number of children: 0  . Years of education: Not on file  . Highest education level: Not on file  Occupational History  . Occupation: active at work    Fish farm manager: UNEMPLOYED  Social Needs  . Financial resource strain: Not on file  . Food insecurity:    Worry: Not on file    Inability: Not on file  . Transportation needs:    Medical: Not on file    Non-medical: Not on file  Tobacco Use  . Smoking status: Former Research scientist (life sciences)  . Smokeless tobacco: Never Used  . Tobacco comment: quit  in high School  Substance and Sexual Activity  . Alcohol use: Yes    Alcohol/week: 2.0 standard drinks    Types: 2 Standard drinks or equivalent per week    Comment: socially  . Drug use: No  . Sexual activity: Yes    Comment: INTERCOURSE AGE 73, SEXUAL PARTNERS  MORE THAN 5  Lifestyle  . Physical activity:    Days per week: Not on file    Minutes per session: Not on file  . Stress: Not on file  Relationships  . Social connections:    Talks on phone: Not on file    Gets together: Not on file    Attends religious service: Not on file    Active member of club or organization: Not on file    Attends meetings of clubs or organizations: Not on file    Relationship status: Not on file  . Intimate partner violence:    Fear of current or ex partner: Not on file    Emotionally abused: Not on file    Physically abused: Not on file    Forced sexual activity: Not on file  Other Topics Concern  . Not on file  Social History Narrative   Lives with boy friend      G4 P0    miscarriage x 3    ectopic  x 1       Allergies as of 09/15/2018      Reactions   Diphenhydramine Hcl    REACTION: VERY WIRED/palpitations  Benadryl   Penicillins    REACTION: HIVES   Tetanus Toxoid    REACTION: REALLY BAD SWELLING   Clindamycin/lincomycin Rash   Rash shortly after she finished a round of abx , see OV note 05-10-15   Hydrocodone    Insomnia, itching (mild)      Medication List        Accurate as of 09/15/18  3:19 PM. Always use your most recent med list.          ALPRAZolam 0.25 MG tablet Commonly known as:  XANAX TAKE 1 TABLET BY MOUTH AT BEDTIME AS NEEDED FOR SLEEP   azithromycin 250 MG tablet Commonly known as:  ZITHROMAX 2 tabs a day the first day, then 1 tab a day x 4 days          Objective:   Physical Exam BP 118/64 (BP Location: Left Arm, Patient Position: Sitting, Cuff Size: Small)   Pulse (!) 117   Temp (!) 100.8 F (38.2 C) (Oral)   Resp 16   Ht 5\' 2"  (1.575 m)    Wt 148 lb 4 oz (67.2 kg)   SpO2 98%   BMI 27.12 kg/m  General:   Well developed, slightly tachycardic, nontoxic-appearing HEENT:  Normocephalic . Face symmetric, atraumatic.  TMs normal, throat symmetric, no red.  Nose is slightly congested, sinuses no TTP. Neck: Soft, full range of motion. Lungs:  CTA B Normal respiratory effort, no intercostal retractions, no accessory muscle use. Heart: RRR,  no murmur.  no pretibial edema bilaterally  Abdomen:  Not distended, soft, slightly tender at the upper abdomen, no mass or rebound.  Normal bowel sounds.   Skin: Not pale. Not jaundice.  No rash Neurologic:  alert & oriented X3.  Speech normal, gait appropriate for age and unassisted Psych--  Cognition and judgment appear intact.  Cooperative with normal attention span and concentration.  Behavior appropriate. No anxious or depressed appearing.     Assessment & Plan:   Assessment H/o dizziness: 2016, saw neurology, EEG was negative. H/o cervical dysplasia H/o OSA ( Not on CPAP, states the second sleep study did not show any improvement with CPAP use)  PLAN: Febrile illness: Febrile illness for the last 3 weeks, temperature was check early with the onset of symptoms and today, unclear if she has been running fevers throughout the 3 weeks. She is febrile here at the office, slightly tachycardic but nontoxic-appearing. I am concerned that the sudden onset of " feeling worse" and myalgias x 24 hours, ?influenza.  There has been some cases documented in the community.  Flu test was negative. Abdomen  was mildly tender upon examination  but has not  experienced abdominal pain prior to OV Plan: CBC, CMP, chest x-ray, UA, urine culture, Monospot, throat CX. We will start empiric Tamiflu, see comments  above Rest and supportive treatment, prompt reevaluation if she is worse, rash, severe headache. Further advised depending on results.

## 2018-09-15 NOTE — Patient Instructions (Addendum)
GO TO THE LAB : Get the blood work     STOP BY THE FIRST FLOOR:  get the XR    Rest, fluids , tylenol  If  cough:  Take Mucinex DM twice a day as needed until better   Avoid decongestants such as  Pseudoephedrine or phenylephrine    Take Tamiflu   Call if not gradually better over the next  10 days  Call anytime if the symptoms are severe: Rash, severe headache, severe abdominal pain, nausea or vomiting.

## 2018-09-16 ENCOUNTER — Other Ambulatory Visit: Payer: Self-pay

## 2018-09-16 ENCOUNTER — Emergency Department (HOSPITAL_BASED_OUTPATIENT_CLINIC_OR_DEPARTMENT_OTHER)
Admission: EM | Admit: 2018-09-16 | Discharge: 2018-09-16 | Disposition: A | Discharge status: Home / Self Care | Attending: Emergency Medicine | Admitting: Emergency Medicine

## 2018-09-16 ENCOUNTER — Emergency Department (HOSPITAL_BASED_OUTPATIENT_CLINIC_OR_DEPARTMENT_OTHER)

## 2018-09-16 ENCOUNTER — Encounter (HOSPITAL_BASED_OUTPATIENT_CLINIC_OR_DEPARTMENT_OTHER): Payer: Self-pay

## 2018-09-16 ENCOUNTER — Telehealth: Payer: Self-pay | Admitting: Internal Medicine

## 2018-09-16 ENCOUNTER — Encounter: Payer: Self-pay | Admitting: Internal Medicine

## 2018-09-16 ENCOUNTER — Other Ambulatory Visit: Payer: Self-pay | Admitting: Internal Medicine

## 2018-09-16 DIAGNOSIS — R109 Unspecified abdominal pain: Secondary | ICD-10-CM | POA: Diagnosis present

## 2018-09-16 DIAGNOSIS — Z79899 Other long term (current) drug therapy: Secondary | ICD-10-CM | POA: Diagnosis not present

## 2018-09-16 DIAGNOSIS — K5792 Diverticulitis of intestine, part unspecified, without perforation or abscess without bleeding: Secondary | ICD-10-CM | POA: Insufficient documentation

## 2018-09-16 DIAGNOSIS — R740 Nonspecific elevation of levels of transaminase and lactic acid dehydrogenase [LDH]: Secondary | ICD-10-CM | POA: Insufficient documentation

## 2018-09-16 DIAGNOSIS — R7401 Elevation of levels of liver transaminase levels: Secondary | ICD-10-CM

## 2018-09-16 LAB — CBC WITH DIFFERENTIAL/PLATELET
BASOS PCT: 0.3 % (ref 0.0–3.0)
Basophils Absolute: 0.1 10*3/uL (ref 0.0–0.1)
Basophils Absolute: 0 10*3/uL (ref 0.0–0.1)
Basophils Relative: 0 %
EOS ABS: 0 10*3/uL (ref 0.0–0.7)
EOS ABS: 0.1 10*3/uL (ref 0.0–0.7)
EOS PCT: 0.1 % (ref 0.0–5.0)
EOS PCT: 1 %
HCT: 37.4 % (ref 36.0–46.0)
HCT: 34.2 % — ABNORMAL LOW (ref 36.0–46.0)
HEMOGLOBIN: 11.5 g/dL — AB (ref 12.0–15.0)
Hemoglobin: 12.4 g/dL (ref 12.0–15.0)
Lymphocytes Relative: 4.4 % — ABNORMAL LOW (ref 12.0–46.0)
Lymphocytes Relative: 11 %
Lymphs Abs: 0.8 10*3/uL (ref 0.7–4.0)
Lymphs Abs: 1.2 10*3/uL (ref 0.7–4.0)
MCH: 30.1 pg (ref 26.0–34.0)
MCHC: 33.3 g/dL (ref 30.0–36.0)
MCHC: 33.6 g/dL (ref 30.0–36.0)
MCV: 89.7 fl (ref 78.0–100.0)
MCV: 89.5 fL (ref 78.0–100.0)
MONO ABS: 1 10*3/uL (ref 0.1–1.0)
MONOS PCT: 7 %
Monocytes Absolute: 0.7 10*3/uL (ref 0.1–1.0)
Monocytes Relative: 5.6 % (ref 3.0–12.0)
NEUTROS ABS: 15.9 10*3/uL — AB (ref 1.4–7.7)
Neutro Abs: 8.7 10*3/uL — ABNORMAL HIGH (ref 1.7–7.7)
Neutrophils Relative %: 89.6 % — ABNORMAL HIGH (ref 43.0–77.0)
Neutrophils Relative %: 81 %
PLATELETS: 242 10*3/uL (ref 150.0–400.0)
PLATELETS: 222 10*3/uL (ref 150–400)
RBC: 4.16 Mil/uL (ref 3.87–5.11)
RBC: 3.82 MIL/uL — ABNORMAL LOW (ref 3.87–5.11)
RDW: 13.3 % (ref 11.5–15.5)
RDW: 13.1 % (ref 11.5–15.5)
WBC: 17.7 10*3/uL — ABNORMAL HIGH (ref 4.0–10.5)
WBC: 10.7 10*3/uL — ABNORMAL HIGH (ref 4.0–10.5)

## 2018-09-16 LAB — COMPREHENSIVE METABOLIC PANEL
ALBUMIN: 4.1 g/dL (ref 3.5–5.2)
ALBUMIN: 3.7 g/dL (ref 3.5–5.0)
ALT: 60 U/L — ABNORMAL HIGH (ref 0–35)
ALT: 109 U/L — AB (ref 0–44)
AST: 64 U/L — AB (ref 0–37)
AST: 93 U/L — ABNORMAL HIGH (ref 15–41)
Alkaline Phosphatase: 81 U/L (ref 39–117)
Alkaline Phosphatase: 97 U/L (ref 38–126)
Anion gap: 10 (ref 5–15)
BUN: 15 mg/dL (ref 6–23)
BUN: 13 mg/dL (ref 6–20)
CALCIUM: 8.7 mg/dL — AB (ref 8.9–10.3)
CHLORIDE: 103 meq/L (ref 96–112)
CO2: 25 meq/L (ref 19–32)
CO2: 25 mmol/L (ref 22–32)
CREATININE: 0.76 mg/dL (ref 0.40–1.20)
CREATININE: 0.83 mg/dL (ref 0.44–1.00)
Calcium: 9.2 mg/dL (ref 8.4–10.5)
Chloride: 103 mmol/L (ref 98–111)
GFR calc Af Amer: >60 mL/min (ref >60)
GFR calc non Af Amer: >60 mL/min (ref >60)
GFR: 82.24 mL/min (ref >60.00)
GLUCOSE: 131 mg/dL — AB (ref 70–99)
GLUCOSE: 91 mg/dL (ref 70–99)
Potassium: 4 mEq/L (ref 3.5–5.1)
Potassium: 4 mmol/L (ref 3.5–5.1)
SODIUM: 138 mmol/L (ref 135–145)
Sodium: 138 mEq/L (ref 135–145)
Total Bilirubin: 0.6 mg/dL (ref 0.2–1.2)
Total Bilirubin: 0.6 mg/dL (ref 0.3–1.2)
Total Protein: 6.7 g/dL (ref 6.0–8.3)
Total Protein: 6.7 g/dL (ref 6.5–8.1)

## 2018-09-16 LAB — URINALYSIS, MICROSCOPIC (REFLEX)

## 2018-09-16 LAB — URINALYSIS, ROUTINE W REFLEX MICROSCOPIC
BILIRUBIN URINE: NEGATIVE
BILIRUBIN URINE: NEGATIVE
GLUCOSE, UA: NEGATIVE mg/dL
Hgb urine dipstick: NEGATIVE
KETONES UR: NEGATIVE
Ketones, ur: 15 mg/dL — AB
Leukocytes, UA: NEGATIVE
Leukocytes, UA: NEGATIVE
Nitrite: NEGATIVE
Nitrite: NEGATIVE
PH: 6 (ref 5.0–8.0)
Protein, ur: NEGATIVE mg/dL
RBC / HPF: NONE SEEN (ref 0–?)
SPECIFIC GRAVITY, URINE: 1.015 (ref 1.000–1.030)
SPECIFIC GRAVITY, URINE: 1.025 (ref 1.005–1.030)
TOTAL PROTEIN, URINE-UPE24: NEGATIVE
UROBILINOGEN UA: 0.2 (ref 0.0–1.0)
Urine Glucose: NEGATIVE
WBC, UA: NONE SEEN (ref 0–?)
pH: 6 (ref 5.0–8.0)

## 2018-09-16 LAB — URINE CULTURE
MICRO NUMBER:: 91177822
SPECIMEN QUALITY: ADEQUATE

## 2018-09-16 LAB — MONONUCLEOSIS SCREEN: MONO SCREEN: NEGATIVE

## 2018-09-16 LAB — LIPASE, BLOOD: Lipase: 67 U/L — ABNORMAL HIGH (ref 11–51)

## 2018-09-16 MED ORDER — ONDANSETRON HCL 4 MG/2ML IJ SOLN
4.0000 mg | Freq: Once | INTRAMUSCULAR | Status: AC
  Administered 2018-09-16: 4 mg via INTRAVENOUS
  Filled 2018-09-16: qty 2

## 2018-09-16 MED ORDER — TRAMADOL HCL 50 MG PO TABS: 50.0000 mg | ORAL_TABLET | Freq: Four times a day (QID) | ORAL | 0 refills | Status: DC | PRN

## 2018-09-16 MED ORDER — CIPROFLOXACIN HCL 500 MG PO TABS: 500.0000 mg | ORAL_TABLET | Freq: Two times a day (BID) | ORAL | 0 refills | Status: AC

## 2018-09-16 MED ORDER — SODIUM CHLORIDE 0.9 % IV BOLUS
1000.0000 mL | Freq: Once | INTRAVENOUS | Status: AC
  Administered 2018-09-16: 1000 mL via INTRAVENOUS

## 2018-09-16 MED ORDER — FENTANYL CITRATE (PF) 100 MCG/2ML IJ SOLN
100.0000 ug | Freq: Once | INTRAMUSCULAR | Status: AC
  Administered 2018-09-16: 100 ug via INTRAVENOUS
  Filled 2018-09-16: qty 2

## 2018-09-16 MED ORDER — MORPHINE SULFATE (PF) 4 MG/ML IV SOLN
4.0000 mg | Freq: Once | INTRAVENOUS | Status: AC
  Administered 2018-09-16: 4 mg via INTRAVENOUS
  Filled 2018-09-16: qty 1

## 2018-09-16 MED ORDER — METRONIDAZOLE 500 MG PO TABS: 500.0000 mg | ORAL_TABLET | Freq: Three times a day (TID) | ORAL | 0 refills | Status: AC

## 2018-09-16 MED ORDER — IOPAMIDOL (ISOVUE-300) INJECTION 61%
100.0000 mL | Freq: Once | INTRAVENOUS | Status: AC | PRN
  Administered 2018-09-16: 100 mL via INTRAVENOUS

## 2018-09-16 NOTE — Telephone Encounter (Signed)
Spoke with the patient today, she actually feels worse and she is having worsening abdominal pain. Results from yesterday:  normal chest x-ray, white count was elevated, LFTs are slightly elevated. Yesterday, abdominal pain was not the main issue but that seems to be the case today. We agreed to go to the ER for further evaluation, case d/w ER MD

## 2018-09-16 NOTE — ED Notes (Signed)
Patient transported to X-ray 

## 2018-09-16 NOTE — Discharge Instructions (Addendum)
Take antibiotics as directed. Please take all of your antibiotics until finished.  Take Flagyl as directed.  It is very important that you do not consume any alcohol while taking this medication as it will cause you to become violently ill.  Take pain medication for severe or breakthrough pain.   As we discussed, your liver enzymes were slightly elevated here today.  This needs to be repeated by her primary care doctor and approxi-1 week to make sure that they are not getting worse.  Return to the emergency department for any worsening fever, worsening abdominal pain, persistent vomiting, inability to eat or drink anything or any other worsening or concerning symptoms.

## 2018-09-16 NOTE — ED Notes (Signed)
Patient transported to CT 

## 2018-09-16 NOTE — ED Triage Notes (Signed)
Pt c/o abd pain x 2 days-was seen by PCP yesterday-was notified she had elevated WBC and liver enzymes-states she was also having flu like sx-neg flu test at [facility] gait

## 2018-09-16 NOTE — ED Provider Notes (Signed)
[place] EMERGENCY DEPARTMENT Provider Note   CSN: [id number] Arrival date & time: 09/16/18  1641     History   Chief Complaint Chief Complaint  Patient presents with  . Abdominal Pain    HPI Debra Norton is a 62 y.o. female past medical history of anxiety, cholelithiasis, anemia who presents for evaluation of 2 weeks of cough, nasal congestion, rhinorrhea, fever and 2 to 3 days of generalized abdominal pain.  Patient reports that initially, she had started having some upper respiratory infection symptoms for last few days.  She reports fever with T-max of 102.0.  She was evaluated by her primary care doctor yesterday for evaluation of symptoms.  At her primary care office, she started experiencing some generalized abdominal pain.  She states that it had been bothering her for the last few days but worsened yesterday while she was at the office.  She reports nausea/vomiting.  Reports emesis is slightly green in nature but no blood noted.  She reports she has not been able to tolerate much p.o. since onset of symptoms.  Patient describes the pain is constant and states she "feels like she got punched in the gut."  She states there are no alleviating or aggravating factors.  Patient reports that she is also had a productive cough as well as nasal congestion or rhinorrhea.  Patient states she is having diarrhea but denies any blood in stool.  She reports that when she urinates "it feels like it is going in slow motion" but does not report any dysuria hematuria.  Patient states she was called by her primary care doctor today and told she had an elevated white blood cell count and slightly elevated liver enzymes.  At that time, she told him she was having worsening abdominal pain and she was prompted to come to the emergency department for further evaluation.  She does have a history of cholecystectomy approximately 1 year ago.  The history is provided by the patient.    Past  Medical History:  Diagnosis Date  . Anxiety   . Cervical dysplasia   . Cholelithiasis    55mm stone seen on ultrasound in 2009  . Migraine headache   . Vertigo     Patient Active Problem List   Diagnosis Date Noted  . Absolute anemia 02/03/2018  . Fatigue associated with anemia 02/03/2018  . Postoperative hypoxemia 10/20/2017  . Hypercapnia 10/20/2017  . At risk for obstructive sleep apnea 10/20/2017  . 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 PCP NOTES >>>>>>>>>>>>> 07/30/2017  . Annual physical exam 06/28/2015  . Allergic reaction 05/11/2015  . Dizziness and giddiness 05/24/2014  . OSA (obstructive sleep apnea) 05/24/2014  . Cervical dysplasia   . GERD 07/05/2010  . COLONIC POLYPS, ADENOMATOUS, HX OF 07/05/2010  . MIGRAINE HEADACHE 05/08/2007    Past Surgical History:  Procedure Laterality Date  . CHOLECYSTECTOMY    . COLPOSCOPY    . ECTOPIC PREGNANCY SURGERY    . EXTERNAL EAR SURGERY    . GYNECOLOGIC CRYOSURGERY       OB History    Gravida  3   Para  0   Term      Preterm      AB  3   Living  0     SAB      TAB      Ectopic  1   Multiple      Live Births           Obstetric Comments  3 miscarriages 1 ectopic         Home Medications    Prior to Admission medications   Medication Sig Start Date End Date Taking? Authorizing Provider  ALPRAZolam Duanne Moron) 0.25 MG tablet TAKE 1 TABLET BY MOUTH AT BEDTIME AS NEEDED FOR SLEEP 11/03/17   Huel Cote, NP  ciprofloxacin (CIPRO) 500 MG tablet Take 1 tablet (500 mg total) by mouth every 12 (twelve) hours for 7 days. 09/16/18 09/23/18  Volanda Napoleon, PA-C  metroNIDAZOLE (FLAGYL) 500 MG tablet Take 1 tablet (500 mg total) by mouth 3 (three) times daily for 7 days. 09/16/18 09/23/18  Volanda Napoleon, PA-C  oseltamivir (TAMIFLU) 75 MG capsule Take 1 capsule (75 mg total) by mouth 2 (two) times daily. 09/15/18   Colon Branch, MD  traMADol (ULTRAM) 50 MG tablet Take 1 tablet (50 mg total) by mouth every 6 (six)  hours as needed. 09/16/18   Volanda Napoleon, PA-C    Family History Family History  Problem Relation Age of Onset  . Colon cancer Mother        in her 72s  . Diabetes Sister   . Ovarian cancer Cousin        Mat. 1st cousin  . Breast cancer Neg Hx   . CAD Neg Hx     Social History Social History   Tobacco Use  . Smoking status: Former Research scientist (life sciences)  . Smokeless tobacco: Never Used  . Tobacco comment: quit in high School  Substance Use Topics  . Alcohol use: Yes    Alcohol/week: 2.0 standard drinks    Types: 2 Standard drinks or equivalent per week    Comment: socially  . Drug use: No     Allergies   Diphenhydramine hcl; Penicillins; Tetanus toxoid; Clindamycin/lincomycin; and Hydrocodone   Review of Systems Review of Systems  Constitutional: Positive for fever.  HENT: Positive for congestion and rhinorrhea.   Respiratory: Positive for cough. Negative for shortness of breath.   Cardiovascular: Negative for chest pain.  Gastrointestinal: Positive for abdominal pain, diarrhea, nausea and vomiting. Negative for blood in stool.  Genitourinary: Negative for dysuria and hematuria.  Neurological: Negative for weakness, numbness and headaches.  All other systems reviewed and are negative.    Physical Exam Updated Vital Signs BP 112/63 (BP Location: Right Arm)   Pulse 84   Temp 98.3 F (36.8 C) (Oral)   Resp 18   Ht 5\' 2"  (1.575 m)   Wt 66.2 kg   SpO2 99%   BMI 26.70 kg/m   Physical Exam  Constitutional: She is oriented to person, place, and time. She appears well-developed and well-nourished.  Appears uncomfortable but no acute distress   HENT:  Head: Normocephalic and atraumatic.  Mouth/Throat: Oropharynx is clear and moist and mucous membranes are normal.  Eyes: Pupils are equal, round, and reactive to light. Conjunctivae, EOM and lids are normal.  Neck: Full passive range of motion without pain.  Cardiovascular: Normal rate, regular rhythm, normal heart sounds  and normal pulses. Exam reveals no gallop and no friction rub.  No murmur heard. Pulmonary/Chest: Effort normal. She has rales.  Faint rales noted to the lower lung fields.  No evidence of respiratory distress.  Able speak in full sentences without any difficulty.  Abdominal: Soft. Normal appearance. There is generalized tenderness. There is guarding (voluntary) and CVA tenderness (Bilateral). There is no rigidity.  Abdomen is soft, nondistended.  No rigidity, some mild voluntary guarding.  Generalized tenderness noted with  no focal point.  Musculoskeletal: Normal range of motion.  Neurological: She is alert and oriented to person, place, and time.  Skin: Skin is warm and dry. Capillary refill takes less than 2 seconds.  Psychiatric: She has a normal mood and affect. Her speech is normal.  Nursing note and vitals reviewed.    ED Treatments / Results  Labs (all labs ordered are listed, but only abnormal results are displayed) Labs Reviewed  COMPREHENSIVE METABOLIC PANEL - Abnormal; Notable for the following components:      Result Value   Calcium 8.7 (*)    AST 93 (*)    ALT 109 (*)    All other components within normal limits  LIPASE, BLOOD - Abnormal; Notable for the following components:   Lipase 67 (*)    All other components within normal limits  CBC WITH DIFFERENTIAL/PLATELET - Abnormal; Notable for the following components:   WBC 10.7 (*)    RBC 3.82 (*)    Hemoglobin 11.5 (*)    HCT 34.2 (*)    Neutro Abs 8.7 (*)    All other components within normal limits  URINALYSIS, ROUTINE W REFLEX MICROSCOPIC - Abnormal; Notable for the following components:   Hgb urine dipstick SMALL (*)    Ketones, ur 15 (*)    All other components within normal limits  URINALYSIS, MICROSCOPIC (REFLEX) - Abnormal; Notable for the following components:   Bacteria, UA RARE (*)    All other components within normal limits    EKG None  Radiology Dg Chest 2 View  Result Date:  09/16/2018 CLINICAL DATA:  Cough, elevated white count EXAM: CHEST - 2 VIEW COMPARISON:  09/15/2018 FINDINGS: Mild cardiac enlargement without CHF or pneumonia. Minor basilar atelectasis. No focal pneumonia, collapse or consolidation. Negative for effusion or pneumothorax. Trachea is midline. Remote cholecystectomy. IMPRESSION: Borderline cardiac enlargement with basilar atelectasis. Electronically Signed   By: Jerilynn Mages.  Shick M.D.   On: 09/16/2018 20:06   Ct Abdomen Pelvis W Contrast  Result Date: 09/16/2018 CLINICAL DATA:  Abdominal pain for 2 days. Elevated white count and liver enzymes. EXAM: CT ABDOMEN AND PELVIS WITH CONTRAST TECHNIQUE: Multidetector CT imaging of the abdomen and pelvis was performed using the standard protocol following bolus administration of intravenous contrast. CONTRAST:  145mL ISOVUE-300 IOPAMIDOL (ISOVUE-300) INJECTION 61% COMPARISON:  None. FINDINGS: Lower chest: No acute abnormality. Hepatobiliary: No focal liver abnormality. Patient is status post cholecystectomy with commensurate bile duct ectasia. Pancreas: Unremarkable. No pancreatic ductal dilatation or surrounding inflammatory changes. Spleen: Normal in size without focal abnormality. Adrenals/Urinary Tract: Adrenal glands appear normal. Kidneys are unremarkable without mass, stone or hydronephrosis. No perinephric fluid. No ureteral or bladder calculi identified. Bladder is decompressed. Stomach/Bowel: Thickening of the walls of the sigmoid colon, with pericolonic inflammation/fluid stranding, consistent with acute diverticulitis. Scattered diverticulosis is present within the sigmoid colon. No dilated large or small bowel loops. Stomach appears normal, partially decompressed. Appendix appears normal. Vascular/Lymphatic: No significant vascular findings are present. No enlarged abdominal or pelvic lymph nodes. Reproductive: Uterus and bilateral adnexa are unremarkable. Other: Small amount of free fluid in the lower pelvis. No  abscess collection seen. No free intraperitoneal air seen. Musculoskeletal: No acute or significant osseous findings. IMPRESSION: 1. Acute uncomplicated diverticulitis within the sigmoid colon. Associated bowel wall thickening and pericolonic inflammation. No abscess collection. No free intraperitoneal air. No bowel obstruction. 2. Small amount of associated free fluid in the lower pelvis. 3. No other acute or significant findings. Status post cholecystectomy. Electronically Signed  By: Franki Cabot M.D.   On: 09/16/2018 19:34    Procedures Procedures (including critical care time)  Medications Ordered in ED Medications  sodium chloride 0.9 % bolus 1,000 mL (0 mLs Intravenous Stopped 09/16/18 1934)  ondansetron (ZOFRAN) injection 4 mg (4 mg Intravenous Given 09/16/18 1807)  morphine 4 MG/ML injection 4 mg (4 mg Intravenous Given 09/16/18 1807)  ondansetron (ZOFRAN) injection 4 mg (4 mg Intravenous Given 09/16/18 1844)  iopamidol (ISOVUE-300) 61 % injection 100 mL (100 mLs Intravenous Contrast Given 09/16/18 1913)  fentaNYL (SUBLIMAZE) injection 100 mcg (100 mcg Intravenous Given 09/16/18 2002)     Initial Impression / Assessment and Plan / ED Course  I have reviewed the triage vital signs and the nursing notes.  Pertinent labs & imaging results that were available during my care of the patient were reviewed by me and considered in my medical decision making (see chart for details).     40-year-old female who presents for evaluation of 2 to 3 weeks of nasal congestion, rhinorrhea, cough and 2 to 3 days of generalized abdominal pain.  Was seen by PCP yesterday and was called today told she had an elevated white blood cell count liver enzymes.  At that time, she was claiming of some worsening abdominal pain was prompted with the emergency department for further evaluation. Patient is afebrile, non-toxic appearing, sitting comfortably on examination table. Vital signs reviewed and stable.  On exam,  patient with generalized abdominal tenderness.  Concern for infectious etiology versus retained stone versus UTI.  Also concern for pneumonia versus upper respiratory infection.  We will plan for chest x-ray, basic labs, UA.  Additionally, given diffuse tenderness, will plan for CT abdomen pelvis for further evaluation.  Lipase slightly elevated at 67.  CMP shows AST is 93 and ALT is 109.  Normal anion gap.  Review of previous records show that yesterday her AST was 64 and ALT was 60.  Prior to that, she had no previous elevations.  Bili is normal.  CBC shows leukocytosis of 10.7.  Yesterday was 17.7.  Hemoglobin stable 11.5 and 34.2.  Yesterday her hemoglobin was 12.4 and hematocrit was 37.4.  UA shows small hemoglobin, no infectious etiology.  Given diffuse abdominal tenderness.  We will plan for CT and pelvis for further evaluation.  CT and pelvis shows topically diverticulitis.  No evidence of perforation, abscess.  No evidence of bowel obstruction, perforation.  Discussed with Dr. Wilson Singer.  Given stability of diverticulitis, can be treated as outpatient basis with antibiotics.  We will plan to have her repeat her AST and ALT with her primary care doctor in about a week to make sure that they are improving.  Discussed results with patient.  She reports improvement in pain.  She is still having a little bit of pain but repeat abdominal exam is improved.  She has tolerated p.o. in the department any difficulty.  Vital signs are stable.  We will give her additional round of pain medications.  We will plan to send home with Flagyl, Cipro for treatment of diverticulitis.  Instructed her to follow-up with her primary care doctor in 1 week to have her liver enzymes repeated. Patient had ample opportunity for questions and discussion. All patient's questions were answered with full understanding. Strict return precautions discussed. Patient expresses understanding and agreement to plan.    Final Clinical  Impressions(s) / ED Diagnoses   Final diagnoses:  Diverticulitis  Transaminitis    ED Discharge Orders  Ordered    metroNIDAZOLE (FLAGYL) 500 MG tablet  3 times daily     09/16/18 2025    ciprofloxacin (CIPRO) 500 MG tablet  Every 12 hours     09/16/18 2025    traMADol (ULTRAM) 50 MG tablet  Every 6 hours PRN     09/16/18 2026           Volanda Napoleon, PA-C 09/17/18 1813    Virgel Manifold, MD 09/21/18 [phone]

## 2018-09-16 NOTE — Assessment & Plan Note (Signed)
Febrile illness: Febrile illness for the last 3 weeks, temperature was check early with the onset of symptoms and today, unclear if she has been running fevers throughout the 3 weeks. She is febrile here at the office, slightly tachycardic but nontoxic-appearing. I am concerned that the sudden onset of " feeling worse" and myalgias x 24 hours, ?influenza.  There has been some cases documented in the community.  Flu test was negative. Abdomen  was mildly tender upon examination  but has not  experienced abdominal pain prior to OV Plan: CBC, CMP, chest x-ray, UA, urine culture, Monospot, throat CX. We will start empiric Tamiflu, see comments  above Rest and supportive treatment, prompt reevaluation if she is worse, rash, severe headache. Further advised depending on results.

## 2018-09-17 ENCOUNTER — Encounter: Payer: Self-pay | Admitting: Internal Medicine

## 2018-09-17 LAB — CULTURE, GROUP A STREP
MICRO NUMBER:: 91177664
SPECIMEN QUALITY: ADEQUATE

## 2018-09-18 ENCOUNTER — Telehealth: Payer: Self-pay

## 2018-09-18 DIAGNOSIS — K5792 Diverticulitis of intestine, part unspecified, without perforation or abscess without bleeding: Secondary | ICD-10-CM

## 2018-09-18 NOTE — Telephone Encounter (Signed)
Spoke with the patient, good compliance with antibiotics. Overall feels much improved.  No fever or chills. Had a normal BM. Urine was darker earlier but today at 11:40 AM looks better. We agreed to continue antibiotics, call if symptoms resurface or she is not completely well. Please arrange a GI referral, diverticulitis, she is actually overdue for a colonoscopy.

## 2018-09-18 NOTE — Telephone Encounter (Signed)
Copied from [place] [id number]. Topic: General - Other >> Sep 18, 2018  9:22 AM Janace Aris A wrote: Reason for CRM: Patient says she has some concerns about her urine being darker than normal, she is not sure if it's blood or not. But she was instructed to let her PCP know if these were one of her symptoms Patient did take antibiotics last night.   Patient did say she sent PCP a my-chart message also, and she is still waiting on a reply with some advice.

## 2018-09-18 NOTE — Telephone Encounter (Signed)
GI referral placed

## 2018-09-20 ENCOUNTER — Telehealth: Payer: Self-pay | Admitting: Internal Medicine

## 2018-09-20 ENCOUNTER — Encounter (HOSPITAL_COMMUNITY): Payer: Self-pay

## 2018-09-20 ENCOUNTER — Emergency Department (HOSPITAL_COMMUNITY)
Admission: EM | Admit: 2018-09-20 | Discharge: 2018-09-20 | Disposition: A | Discharge status: Home / Self Care | Attending: Emergency Medicine | Admitting: Emergency Medicine

## 2018-09-20 ENCOUNTER — Other Ambulatory Visit: Payer: Self-pay

## 2018-09-20 DIAGNOSIS — Z79899 Other long term (current) drug therapy: Secondary | ICD-10-CM | POA: Insufficient documentation

## 2018-09-20 DIAGNOSIS — R112 Nausea with vomiting, unspecified: Secondary | ICD-10-CM | POA: Diagnosis present

## 2018-09-20 DIAGNOSIS — D649 Anemia, unspecified: Secondary | ICD-10-CM | POA: Diagnosis not present

## 2018-09-20 DIAGNOSIS — K5792 Diverticulitis of intestine, part unspecified, without perforation or abscess without bleeding: Secondary | ICD-10-CM | POA: Insufficient documentation

## 2018-09-20 LAB — CBC WITH DIFFERENTIAL/PLATELET
BASOS PCT: 0 %
Basophils Absolute: 0 10*3/uL (ref 0.0–0.1)
Eosinophils Absolute: 0.3 10*3/uL (ref 0.0–0.7)
Eosinophils Relative: 5 %
HEMATOCRIT: 38.7 % (ref 36.0–46.0)
HEMOGLOBIN: 13 g/dL (ref 12.0–15.0)
Lymphocytes Relative: 21 %
Lymphs Abs: 1.1 10*3/uL (ref 0.7–4.0)
MCH: 30.2 pg (ref 26.0–34.0)
MCHC: 33.6 g/dL (ref 30.0–36.0)
MCV: 90 fL (ref 78.0–100.0)
MONOS PCT: 11 %
Monocytes Absolute: 0.6 10*3/uL (ref 0.1–1.0)
NEUTROS ABS: 3.4 10*3/uL (ref 1.7–7.7)
NEUTROS PCT: 63 %
Platelets: 304 10*3/uL (ref 150–400)
RBC: 4.3 MIL/uL (ref 3.87–5.11)
RDW: 13.4 % (ref 11.5–15.5)
WBC: 5.4 10*3/uL (ref 4.0–10.5)

## 2018-09-20 LAB — URINALYSIS, ROUTINE W REFLEX MICROSCOPIC
BACTERIA UA: NONE SEEN
BILIRUBIN URINE: NEGATIVE
Glucose, UA: NEGATIVE mg/dL
Hgb urine dipstick: NEGATIVE
KETONES UR: NEGATIVE mg/dL
NITRITE: NEGATIVE
Protein, ur: NEGATIVE mg/dL
Specific Gravity, Urine: 1.021 (ref 1.005–1.030)
pH: 7 (ref 5.0–8.0)

## 2018-09-20 LAB — COMPREHENSIVE METABOLIC PANEL
ALBUMIN: 3.9 g/dL (ref 3.5–5.0)
ALK PHOS: 165 U/L — AB (ref 38–126)
ALT: 165 U/L — ABNORMAL HIGH (ref 0–44)
AST: 105 U/L — ABNORMAL HIGH (ref 15–41)
Anion gap: 9 (ref 5–15)
BILIRUBIN TOTAL: 0.9 mg/dL (ref 0.3–1.2)
BUN: 12 mg/dL (ref 6–20)
CO2: 25 mmol/L (ref 22–32)
Calcium: 9.5 mg/dL (ref 8.9–10.3)
Chloride: 110 mmol/L (ref 98–111)
Creatinine, Ser: 0.94 mg/dL (ref 0.44–1.00)
GFR calc Af Amer: >60 mL/min (ref >60)
GFR calc non Af Amer: >60 mL/min (ref >60)
GLUCOSE: 99 mg/dL (ref 70–99)
Potassium: 4.4 mmol/L (ref 3.5–5.1)
Sodium: 144 mmol/L (ref 135–145)
Total Protein: 7 g/dL (ref 6.5–8.1)

## 2018-09-20 LAB — LIPASE, BLOOD: Lipase: 34 U/L (ref 11–51)

## 2018-09-20 MED ORDER — ONDANSETRON HCL 4 MG/2ML IJ SOLN
4.0000 mg | Freq: Once | INTRAMUSCULAR | Status: AC
  Administered 2018-09-20: 4 mg via INTRAVENOUS
  Filled 2018-09-20: qty 2

## 2018-09-20 MED ORDER — ONDANSETRON 4 MG PO TBDP: 4.0000 mg | ORAL_TABLET | Freq: Three times a day (TID) | ORAL | 0 refills | Status: DC | PRN

## 2018-09-20 MED ORDER — SODIUM CHLORIDE 0.9 % IV BOLUS
1000.0000 mL | Freq: Once | INTRAVENOUS | Status: AC
  Administered 2018-09-20: 1000 mL via INTRAVENOUS

## 2018-09-20 NOTE — Discharge Instructions (Addendum)
It was my pleasure taking care of you today!   Zofran as needed for nausea. Continue your antibiotics until completed.  Start using probiotic daily.   Return to ER for fever, worsening abdominal pain, new or worsening symptoms, any additional concerns.

## 2018-09-20 NOTE — ED Provider Notes (Signed)
[facility] DEPT Provider Note   CSN: [id number] Arrival date & time: 09/20/18  0935     History   Chief Complaint Chief Complaint  Patient presents with  . Emesis  . Diarrhea    HPI Debra Norton is a 62 y.o. female.  The history is provided by the patient and medical records. No language interpreter was used.    Debra Norton is a 62 y.o. female  with a PMH as listed below who presents to the Emergency Department complaining of nausea and vomiting which began this morning.  She reports one episode, however this lasting about 10 minutes persistent vomiting.  No emesis since then.  No fever or chills.  Patient was actually seen on October 1 for fever by PCP.  She was started on Tamiflu for her febrile illness.  The following day on 10/02, she was seen in the emergency department where CT was done showing diverticulitis.  She was thought to be low risk and discharged home on Cipro/Flagyl.  She has been taking this medication without any missed doses and felt as if her symptoms were improving.  She does endorse abdominal pain, but as stated above, her abdominal pain has been getting much better.  She has been having some non-bloody diarrhea.  She also feels as if her urine is very dark.  She is not having any dysuria or urinary urgency/frequency.  Past Medical History:  Diagnosis Date  . Anxiety   . Cervical dysplasia   . Cholelithiasis    27mm stone seen on ultrasound in 2009  . Migraine headache   . Vertigo     Patient Active Problem List   Diagnosis Date Noted  . Absolute anemia 02/03/2018  . Fatigue associated with anemia 02/03/2018  . Postoperative hypoxemia 10/20/2017  . Hypercapnia 10/20/2017  . At risk for obstructive sleep apnea 10/20/2017  . 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 PCP NOTES >>>>>>>>>>>>> 07/30/2017  . Annual physical exam 06/28/2015  . Allergic reaction 05/11/2015  . Dizziness and giddiness 05/24/2014  . OSA  (obstructive sleep apnea) 05/24/2014  . Cervical dysplasia   . GERD 07/05/2010  . COLONIC POLYPS, ADENOMATOUS, HX OF 07/05/2010  . MIGRAINE HEADACHE 05/08/2007    Past Surgical History:  Procedure Laterality Date  . CHOLECYSTECTOMY    . COLPOSCOPY    . ECTOPIC PREGNANCY SURGERY    . EXTERNAL EAR SURGERY    . GYNECOLOGIC CRYOSURGERY       OB History    Gravida  3   Para  0   Term      Preterm      AB  3   Living  0     SAB      TAB      Ectopic  1   Multiple      Live Births           Obstetric Comments  3 miscarriages 1 ectopic         Home Medications    Prior to Admission medications   Medication Sig Start Date End Date Taking? Authorizing Provider  ALPRAZolam Duanne Moron) 0.25 MG tablet TAKE 1 TABLET BY MOUTH AT BEDTIME AS NEEDED FOR SLEEP 11/03/17  Yes Huel Cote, NP  Ascorbic Acid (VITA-C PO) Take 1 tablet by mouth daily.   Yes [provider]  Cholecalciferol (VITAMIN D3 PO) Take 1 tablet by mouth daily.   Yes [provider]  ciprofloxacin (CIPRO) 500 MG tablet Take 1 tablet (  500 mg total) by mouth every 12 (twelve) hours for 7 days. 09/16/18 09/23/18 Yes Volanda Napoleon, PA-C  metroNIDAZOLE (FLAGYL) 500 MG tablet Take 1 tablet (500 mg total) by mouth 3 (three) times daily for 7 days. 09/16/18 09/23/18 Yes Volanda Napoleon, PA-C  Multiple Vitamins-Minerals (MULTIMINERAL PLUS) TABS Take 2 tablets by mouth daily.   Yes [provider]  traMADol (ULTRAM) 50 MG tablet Take 1 tablet (50 mg total) by mouth every 6 (six) hours as needed. 09/16/18  Yes Providence Lanius A, PA-C  ondansetron (ZOFRAN ODT) 4 MG disintegrating tablet Take 1 tablet (4 mg total) by mouth every 8 (eight) hours as needed for nausea or vomiting. 09/20/18   Ward, Ozella Almond, PA-C    Family History Family History  Problem Relation Age of Onset  . Colon cancer Mother        in her 73s  . Diabetes Sister   . Ovarian cancer Cousin        Mat. 1st cousin    . Breast cancer Neg Hx   . CAD Neg Hx     Social History Social History   Tobacco Use  . Smoking status: Former Research scientist (life sciences)  . Smokeless tobacco: Never Used  . Tobacco comment: quit in high School  Substance Use Topics  . Alcohol use: Yes    Alcohol/week: 2.0 standard drinks    Types: 2 Standard drinks or equivalent per week    Comment: socially  . Drug use: No     Allergies   Diphenhydramine hcl; Penicillins; Tetanus toxoid; Clindamycin/lincomycin; and Hydrocodone   Review of Systems Review of Systems  Gastrointestinal: Positive for abdominal pain, diarrhea, nausea and vomiting.  All other systems reviewed and are negative.    Physical Exam Updated Vital Signs BP (!) 107/53 (BP Location: Right Arm)   Pulse (!) 58   Temp 98.3 F (36.8 C) (Oral)   Resp 17   Ht 5\' 2"  (1.575 m)   Wt 66.2 kg   SpO2 98%   BMI 26.70 kg/m   Physical Exam  Constitutional: She is oriented to person, place, and time. She appears well-developed and well-nourished. No distress.  HENT:  Head: Normocephalic and atraumatic.  Neck: Neck supple.  Cardiovascular: Normal rate, regular rhythm and normal heart sounds.  No murmur heard. Pulmonary/Chest: Effort normal and breath sounds normal. No respiratory distress.  Abdominal: Soft. Bowel sounds are normal. She exhibits no distension.  Tenderness to palpation to LLQ without rebound or guarding. She reports tenderness is improved compared to 10/02.   Neurological: She is alert and oriented to person, place, and time.  Skin: Skin is warm and dry.  Nursing note and vitals reviewed.    ED Treatments / Results  Labs (all labs ordered are listed, but only abnormal results are displayed) Labs Reviewed  COMPREHENSIVE METABOLIC PANEL - Abnormal; Notable for the following components:      Result Value   AST 105 (*)    ALT 165 (*)    Alkaline Phosphatase 165 (*)    All other components within normal limits  URINALYSIS, ROUTINE W REFLEX MICROSCOPIC  - Abnormal; Notable for the following components:   Color, Urine AMBER (*)    Leukocytes, UA SMALL (*)    All other components within normal limits  CBC WITH DIFFERENTIAL/PLATELET  LIPASE, BLOOD    EKG None  Radiology No results found.  Procedures Procedures (including critical care time)  Medications Ordered in ED Medications  ondansetron (ZOFRAN) injection 4 mg (  4 mg Intravenous Given 09/20/18 1044)  sodium chloride 0.9 % bolus 1,000 mL (0 mLs Intravenous Stopped 09/20/18 1219)     Initial Impression / Assessment and Plan / ED Course  I have reviewed the triage vital signs and the nursing notes.  Pertinent labs & imaging results that were available during my care of the patient were reviewed by me and considered in my medical decision making (see chart for details).    Debra Norton is a 62 y.o. female who presents to ED for nausea and vomiting that began today.  Chart reviewed extensively.  On 10/01, patient saw PCP for febrile illness.  She was given Tamiflu but she has been taking.  On 10/02, she presented to emergency department where she had CT scan of her abdomen and was diagnosed with diverticulitis.  She was started on Cipro/Flagyl and discharged home.  She has been compliant with this medication.  She feels as if her abdominal pain is improving. On exam, she is afebrile, hemodynamically stable with tenderness to the left lower quadrant.  There are no peritoneal signs.  She states that her abdominal exam today is much improved and on 10/2 as she could barely tolerate palpation of the abdomen then. Normal white count.  UA without signs of infection. Repeat abdominal exam without decompensation. No RUQ tenderness. Still no fevers and VSS. Given improvement of abdominal pain / exam, doubt perf or abscess. We did discuss the possibility of this and potential CT. after conversation with the patient and spouse, shared decision making to hold on CT and treat symptomatically.   Patient can follow-up with PCP and understands reasons to return to the ER including fever, worsening abdominal pain, persistent vomiting, new symptoms or any additional concerns.  Patient discussed with Dr. Tamera Punt who agrees with treatment plan.    Final Clinical Impressions(s) / ED Diagnoses   Final diagnoses:  Non-intractable vomiting with nausea, unspecified vomiting type  Diverticulitis    ED Discharge Orders         Ordered    ondansetron (ZOFRAN ODT) 4 MG disintegrating tablet  Every 8 hours PRN     09/20/18 1258           Ward, Ozella Almond, PA-C 09/20/18 1341    Malvin Johns, MD 09/20/18 1515

## 2018-09-20 NOTE — ED Triage Notes (Signed)
Pt has had yellow green emesis today, as well as diarrhea. Pt states that yesterday and the day prior, pt states her urine was very dark, possibly red. Pt states she went her PCP Tuesday, where she was given tamiflu, which led to abdominal pain. Pt was then seen at [facility], where she was dx with diverticulitis. Pt was then put on 2 abx and painkiller, pt states that she had symptoms resolve. Pt states that today she feels like she is at the beginning.  Pt also complains of gas, which she was told would improved when she got her gallbladder out (1 year ago). Pt states no improvement since then.

## 2018-09-20 NOTE — Telephone Encounter (Signed)
Patient was seen during the weekend at the ER, please arrange a follow-up within the next 10 days depending on how she is feeling.

## 2018-09-21 NOTE — Telephone Encounter (Signed)
An alternative would be Augmentin but she can't take due to her history of penicillin allergies If she can finish the treatment as prescribed would be the best thing, if unable to, will probably have to stay on  Cipro only.

## 2018-09-21 NOTE — Telephone Encounter (Signed)
Needs ED f/u this week. Please schedule at her convenience.

## 2018-09-21 NOTE — Telephone Encounter (Signed)
Pt states that she is going out of town soon and would like to play the appt by ear. Pt did have a concern about the side effects of the antibiotic that they put her on (flagil)? She stated that she is have almost all the side effects. ED said that they could change it, however, they suggested that she try to stay on it, as the alternative would be a longer course of medication. Do you have any suggestions about an alternative? Pt states that she is also very gassy and has been told that this is unrelated to the diverticulitis.   Pt would like a call from the nurse when available.

## 2018-09-22 NOTE — Telephone Encounter (Signed)
Spoke w/ Pt- informed of recommendations. Pt verbalized understanding.  

## 2018-09-24 ENCOUNTER — Other Ambulatory Visit (INDEPENDENT_AMBULATORY_CARE_PROVIDER_SITE_OTHER)

## 2018-09-24 ENCOUNTER — Encounter: Payer: Self-pay | Admitting: Gastroenterology

## 2018-09-24 ENCOUNTER — Ambulatory Visit (INDEPENDENT_AMBULATORY_CARE_PROVIDER_SITE_OTHER): Admitting: Gastroenterology

## 2018-09-24 VITALS — BP 108/62 | HR 80 | Ht 62.0 in | Wt 146.6 lb

## 2018-09-24 DIAGNOSIS — K5732 Diverticulitis of large intestine without perforation or abscess without bleeding: Secondary | ICD-10-CM | POA: Diagnosis not present

## 2018-09-24 DIAGNOSIS — R748 Abnormal levels of other serum enzymes: Secondary | ICD-10-CM | POA: Diagnosis not present

## 2018-09-24 LAB — HEPATIC FUNCTION PANEL
ALK PHOS: 122 U/L — AB (ref 39–117)
ALT: 112 U/L — ABNORMAL HIGH (ref 0–35)
AST: 65 U/L — AB (ref 0–37)
Albumin: 4.1 g/dL (ref 3.5–5.2)
BILIRUBIN DIRECT: 0.1 mg/dL (ref 0.0–0.3)
TOTAL PROTEIN: 7.4 g/dL (ref 6.0–8.3)
Total Bilirubin: 0.3 mg/dL (ref 0.2–1.2)

## 2018-09-24 MED ORDER — SUPREP BOWEL PREP KIT 17.5-3.13-1.6 GM/177ML PO SOLN: ORAL | 0 refills | Status: DC

## 2018-09-24 NOTE — Progress Notes (Signed)
HPI :  62 year old female with a history of cholecystectomy, diverticulosis, history of colon polyps, anxiety, referred by Dr. Kathlene November for diverticulitis. She is been seen remotely at our clinic in 2011 for colonoscopy by Dr. Olevia Perches. That exam was remarkable for 2 serrated adenomas and left-sided diverticulosis. She has not yet had a follow-up colonoscopy for the polyps.   She reports feeling poorly for the past 3 weeks or so. She felt like she had some type of virus with fatigue, nausea, and intermittent fevers up to 102. She eventually developed abdominal pain but this started much more recently, which she states felt was diffuse and was difficult to localize. She did not have any vomiting. Symptoms eventually worsened and she was noted to have a tender abdomen on exam by her primary care. She was sent to the emergency room and had a CT scan done on October 2 which showed uncomplicated diverticulitis of the sigmoid colon. She was placed on a one-week course of Cipro and Flagyl which she just completed yesterday. She reports her fevers have resolved, her abdominal pain is better and mostly resolved and generally she overall feels better, however she continues to feel fatigued and weak. She denies any right upper quadrant pain. Before she started her antibiotics, on October 1, she had an ALT elevation to 60s. Most recent labs from 10/6 show interval elevation of ALT to 165 along with AST elevation to 105 and AP elevation to 165. She previously tested negative for mono and influenza. This is her first time she has ever had diverticulitis. CXR was normal.   CT scan 05/18/6947 - uncomplicated diverticulitis of the sigmoid colon  Colonoscopy 07/03/2010 - 2 left sided polyps 3-78mm in size, mild diverticulosis sigmoid colon - path c/w serrated adenomas  Past Medical History:  Diagnosis Date  . Anxiety   . Cervical dysplasia   . Cholelithiasis    61mm stone seen on ultrasound in 2009  . Colon polyp   .  Diverticulitis   . Migraine headache   . Vertigo      Past Surgical History:  Procedure Laterality Date  . CHOLECYSTECTOMY    . COLPOSCOPY    . ECTOPIC PREGNANCY SURGERY    . EXTERNAL EAR SURGERY Left   . GYNECOLOGIC CRYOSURGERY     Family History  Problem Relation Age of Onset  . Colon cancer Mother        in her 23s  . Diabetes Sister   . Ovarian cancer Cousin        Mat. 1st cousin  . Breast cancer Neg Hx   . CAD Neg Hx   . Esophageal cancer Neg Hx   . Pancreatic cancer Neg Hx    Social History   Tobacco Use  . Smoking status: Former Research scientist (life sciences)  . Smokeless tobacco: Never Used  . Tobacco comment: quit in high School  Substance Use Topics  . Alcohol use: Yes    Alcohol/week: 2.0 standard drinks    Types: 2 Standard drinks or equivalent per week    Comment: socially  . Drug use: No   Current Outpatient Medications  Medication Sig Dispense Refill  . ALPRAZolam (XANAX) 0.25 MG tablet TAKE 1 TABLET BY MOUTH AT BEDTIME AS NEEDED FOR SLEEP 30 tablet 1  . ondansetron (ZOFRAN ODT) 4 MG disintegrating tablet Take 1 tablet (4 mg total) by mouth every 8 (eight) hours as needed for nausea or vomiting. 20 tablet 0  . Ascorbic Acid (VITA-C PO) Take 1 tablet  by mouth daily.    . Cholecalciferol (VITAMIN D3 PO) Take 1 tablet by mouth daily.    . Multiple Vitamins-Minerals (MULTIMINERAL PLUS) TABS Take 2 tablets by mouth daily.    . Probiotic Product (PROBIOTIC PO) Take 1 capsule by mouth daily.    . traMADol (ULTRAM) 50 MG tablet Take 1 tablet (50 mg total) by mouth every 6 (six) hours as needed. (Patient not taking: Reported on 09/24/2018) 6 tablet 0   No current facility-administered medications for this visit.    Allergies  Allergen Reactions  . Diphenhydramine Hcl     REACTION: VERY WIRED/palpitations  Benadryl  . Flagyl [Metronidazole] Nausea And Vomiting  . Penicillins     REACTION: HIVES  . Tetanus Toxoid     REACTION: REALLY BAD SWELLING  . Clindamycin/Lincomycin  Rash    Rash shortly after she finished a round of abx , see OV note 05-10-15  . Hydrocodone     Insomnia, itching (mild)     Review of Systems: All systems reviewed and negative except where noted in HPI.    Dg Chest 2 View  Result Date: 09/16/2018 CLINICAL DATA:  Cough, elevated white count EXAM: CHEST - 2 VIEW COMPARISON:  09/15/2018 FINDINGS: Mild cardiac enlargement without CHF or pneumonia. Minor basilar atelectasis. No focal pneumonia, collapse or consolidation. Negative for effusion or pneumothorax. Trachea is midline. Remote cholecystectomy. IMPRESSION: Borderline cardiac enlargement with basilar atelectasis. Electronically Signed   By: Jerilynn Mages.  Shick M.D.   On: 09/16/2018 20:06   Dg Chest 2 View  Result Date: 09/15/2018 CLINICAL DATA:  Cough and fever EXAM: CHEST - 2 VIEW COMPARISON:  11/25/2016 FINDINGS: The heart size and mediastinal contours are within normal limits. Both lungs are clear. The visualized skeletal structures are unremarkable. Normal heart size and vascularity. Trachea is midline. Remote cholecystectomy noted. IMPRESSION: No active cardiopulmonary disease. Electronically Signed   By: Jerilynn Mages.  Shick M.D.   On: 09/15/2018 20:06   Ct Abdomen Pelvis W Contrast  Result Date: 09/16/2018 CLINICAL DATA:  Abdominal pain for 2 days. Elevated white count and liver enzymes. EXAM: CT ABDOMEN AND PELVIS WITH CONTRAST TECHNIQUE: Multidetector CT imaging of the abdomen and pelvis was performed using the standard protocol following bolus administration of intravenous contrast. CONTRAST:  152mL ISOVUE-300 IOPAMIDOL (ISOVUE-300) INJECTION 61% COMPARISON:  None. FINDINGS: Lower chest: No acute abnormality. Hepatobiliary: No focal liver abnormality. Patient is status post cholecystectomy with commensurate bile duct ectasia. Pancreas: Unremarkable. No pancreatic ductal dilatation or surrounding inflammatory changes. Spleen: Normal in size without focal abnormality. Adrenals/Urinary Tract: Adrenal  glands appear normal. Kidneys are unremarkable without mass, stone or hydronephrosis. No perinephric fluid. No ureteral or bladder calculi identified. Bladder is decompressed. Stomach/Bowel: Thickening of the walls of the sigmoid colon, with pericolonic inflammation/fluid stranding, consistent with acute diverticulitis. Scattered diverticulosis is present within the sigmoid colon. No dilated large or small bowel loops. Stomach appears normal, partially decompressed. Appendix appears normal. Vascular/Lymphatic: No significant vascular findings are present. No enlarged abdominal or pelvic lymph nodes. Reproductive: Uterus and bilateral adnexa are unremarkable. Other: Small amount of free fluid in the lower pelvis. No abscess collection seen. No free intraperitoneal air seen. Musculoskeletal: No acute or significant osseous findings. IMPRESSION: 1. Acute uncomplicated diverticulitis within the sigmoid colon. Associated bowel wall thickening and pericolonic inflammation. No abscess collection. No free intraperitoneal air. No bowel obstruction. 2. Small amount of associated free fluid in the lower pelvis. 3. No other acute or significant findings. Status post cholecystectomy. Electronically Signed  By: Franki Cabot M.D.   On: 09/16/2018 19:34    Physical Exam: BP 108/62   Pulse 80   Ht 5\' 2"  (1.575 m)   Wt 146 lb 9.6 oz (66.5 kg)   BMI 26.81 kg/m  Constitutional: Pleasant,well-developed, female in no acute distress. HEENT: Normocephalic and atraumatic. Conjunctivae are normal. No scleral icterus. Neck supple.  Cardiovascular: Normal rate, regular rhythm.  Pulmonary/chest: Effort normal and breath sounds normal. No wheezing, rales or rhonchi. Abdominal: Soft, nondistended, nontender. There are no masses palpable. No hepatomegaly. Extremities: no edema Lymphadenopathy: No cervical adenopathy noted. Neurological: Alert and oriented to person place and time. Skin: Skin is warm and dry. No rashes  noted. Psychiatric: Normal mood and affect. Behavior is normal.   ASSESSMENT AND PLAN: 62 year old female here for new patient assessment the following issues:  Diverticulitis / elevated liver enzymes - diverticulitis noted on CT scan and treated with antibiotics with interval resolution of abdominal pain and fevers. Her course is a bit atypical for solely diverticulitis however given she had roughly 3 weeks' worth of fevers and her pain started only shortly before her CT scan was done a week ago. In light of her prodrome of symptoms and now elevation of liver enzymes which preceded her recent antibiotic use, it's possible she has had some sort of viral illness along with diverticulitis. Given her continued rise in liver enzymes, we will repeat those today to see if these have peaked and are down trending, and also send acute hepatitis panel. Her CT imaging showed no abnormalities of the liver or biliary tree. If LAEs continue to rise over time will send additional serologic studies to evaluate this and will need to be closely monitored over the upcoming days. Otherwise we discussed diverticulitis in general. I'm recommending a colonoscopy to be done in the next 6-8 weeks after she is recovered from this episode, as she also has history of serrated adenomas 2 and is overdue for surveillance in regards to her polyps. I discussed the risks and benefits of colonoscopy and anesthesia with her and she wanted to proceed. Further recommendations pending her course and her follow-up labs. I will contact her with the results with further recommendations regarding liver enzymes. If she has recurrent fevers or pains before asked her to contact me. She agreed with the plan.   Massac Cellar, MD [facility]  CC: Colon Branch, MD

## 2018-09-24 NOTE — Patient Instructions (Addendum)
If you are age 61 or older, your body mass index should be between 23-30. Your Body mass index is 26.81 kg/m. If this is out of the aforementioned range listed, please consider follow up with your Primary Care Provider.  If you are age 16 or younger, your body mass index should be between 19-25. Your Body mass index is 26.81 kg/m. If this is out of the aformentioned range listed, please consider follow up with your Primary Care Provider.    You have been scheduled for a colonoscopy. Please follow written instructions given to you at your visit today.  Please pick up your prep supplies at the pharmacy within the next 1-3 days. If you use inhalers (even only as needed), please bring them with you on the day of your procedure. Your physician has requested that you go to www.startemmi.com and enter the access code given to you at your visit today. This web site gives a general overview about your procedure. However, you should still follow specific instructions given to you by our office regarding your preparation for the procedure.   Please go to the lab in the basement of our building to have lab work done as you leave today.  Thank you for entrusting me with your care and for choosing [facility], Dr.  Cellar

## 2018-09-25 ENCOUNTER — Other Ambulatory Visit: Payer: Self-pay

## 2018-09-25 ENCOUNTER — Telehealth: Payer: Self-pay | Admitting: Gastroenterology

## 2018-09-25 DIAGNOSIS — R748 Abnormal levels of other serum enzymes: Secondary | ICD-10-CM

## 2018-09-25 LAB — HEPATITIS PANEL, ACUTE
HEP A IGM: NONREACTIVE
HEP B C IGM: NONREACTIVE
Hepatitis B Surface Ag: NONREACTIVE
Hepatitis C Ab: NONREACTIVE
SIGNAL TO CUT-OFF: 0.03 (ref <1.00)

## 2018-09-25 NOTE — Telephone Encounter (Signed)
Routed to Dr. Armbruster. 

## 2018-09-25 NOTE — Telephone Encounter (Signed)
Debra Norton that is fine, if you can help write her a letter. She was recently diagnosed with diverticulitis and is still recovering. You can say that she has been treated for a medical condition recently by our office and she is not traveling while she is recovering.   Otherwise, can you let her know that her liver function testing is improved from previous, they appear to have peaked and downtrending, but not yet back to baseline. I am waiting the viral hep panel. We will let her know that result when it comes in. I think we should repeat her LFTs in 2-3 weeks to see if they normalize with more time. If they remain elevated over time, will need further workup. Thanks

## 2018-09-25 NOTE — Telephone Encounter (Signed)
Patient will come by and pick up letter, she understands that labs are improving and to come in 2-3 weeks to recheck. Labs ordered.

## 2018-10-13 ENCOUNTER — Other Ambulatory Visit (INDEPENDENT_AMBULATORY_CARE_PROVIDER_SITE_OTHER)

## 2018-10-13 DIAGNOSIS — R748 Abnormal levels of other serum enzymes: Secondary | ICD-10-CM | POA: Diagnosis not present

## 2018-10-13 LAB — HEPATIC FUNCTION PANEL
ALBUMIN: 4.3 g/dL (ref 3.5–5.2)
ALK PHOS: 65 U/L (ref 39–117)
ALT: 22 U/L (ref 0–35)
AST: 22 U/L (ref 0–37)
BILIRUBIN DIRECT: 0.1 mg/dL (ref 0.0–0.3)
TOTAL PROTEIN: 7.1 g/dL (ref 6.0–8.3)
Total Bilirubin: 0.3 mg/dL (ref 0.2–1.2)

## 2018-11-06 ENCOUNTER — Ambulatory Visit (AMBULATORY_SURGERY_CENTER): Admitting: Gastroenterology

## 2018-11-06 ENCOUNTER — Encounter: Payer: Self-pay | Admitting: Gastroenterology

## 2018-11-06 VITALS — BP 110/43 | HR 66 | Temp 96.9°F | Resp 9 | Ht 62.0 in | Wt 146.0 lb

## 2018-11-06 DIAGNOSIS — K5732 Diverticulitis of large intestine without perforation or abscess without bleeding: Secondary | ICD-10-CM

## 2018-11-06 DIAGNOSIS — K635 Polyp of colon: Secondary | ICD-10-CM | POA: Diagnosis not present

## 2018-11-06 DIAGNOSIS — D125 Benign neoplasm of sigmoid colon: Secondary | ICD-10-CM | POA: Diagnosis not present

## 2018-11-06 DIAGNOSIS — D122 Benign neoplasm of ascending colon: Secondary | ICD-10-CM | POA: Diagnosis present

## 2018-11-06 DIAGNOSIS — Z86010 Personal history of colonic polyps: Secondary | ICD-10-CM

## 2018-11-06 MED ORDER — SODIUM CHLORIDE 0.9 % IV SOLN: 500.0000 mL | Freq: Once | INTRAVENOUS | Status: DC

## 2018-11-06 NOTE — Progress Notes (Signed)
Called to room to assist during endoscopic procedure.  Patient ID and intended procedure confirmed with present staff. Received instructions for my participation in the procedure from the performing physician.  

## 2018-11-06 NOTE — Op Note (Addendum)
[facility] Patient Name: Debra Norton Procedure Date: 11/06/2018 1:07 PM MRN: [id number] Endoscopist: Remo Lipps P. Havery Moros , MD Age: 62 Referring MD:  Date of Birth: 30-Aug-1956 Gender: Female Account #: [id number] Procedure:                Colonoscopy Indications:              Follow-up of sigmoid diverticulitis, history of                            polyps Medicines:                Monitored Anesthesia Care Procedure:                Pre-Anesthesia Assessment:                           - Prior to the procedure, a History and Physical                            was performed, and patient medications and                            allergies were reviewed. The patient's tolerance of                            previous anesthesia was also reviewed. The risks                            and benefits of the procedure and the sedation                            options and risks were discussed with the patient.                            All questions were answered, and informed consent                            was obtained. Prior Anticoagulants: The patient has                            taken no previous anticoagulant or antiplatelet                            agents. ASA Grade Assessment: II - A patient with                            mild systemic disease. After reviewing the risks                            and benefits, the patient was deemed in                            satisfactory condition to undergo the procedure.  After obtaining informed consent, the colonoscope                            was passed under direct vision. Throughout the                            procedure, the patient's blood pressure, pulse, and                            oxygen saturations were monitored continuously. The                            Model PCF-[id number]) scope was introduced                            through the anus and advanced to the the  terminal                            ileum, with identification of the appendiceal                            orifice and IC valve. The colonoscopy was performed                            without difficulty. The patient tolerated the                            procedure well. The quality of the bowel                            preparation was good. The terminal ileum, ileocecal                            valve, appendiceal orifice, and rectum were                            photographed. Scope In: 1:33:44 PM Scope Out: 2:00:51 PM Scope Withdrawal Time: 0 hours 24 minutes 38 seconds  Total Procedure Duration: 0 hours 27 minutes 7 seconds  Findings:                 The perianal and digital rectal examinations were                            normal.                           The terminal ileum appeared normal.                           Multiple medium-mouthed diverticula were found in                            the sigmoid colon.  Two flat polyps were found in the ascending colon.                            The polyps were 4 to 6 mm in size. These polyps                            were removed with a cold snare. Resection and                            retrieval were complete.                           Three sessile polyps were found in the sigmoid                            colon. The polyps were 3 to 4 mm in size. These                            polyps were removed with a cold snare. Resection                            and retrieval were complete.                           Internal hemorrhoids were found during retroflexion.                           The colon was tortous which prolonged the exam. The                            exam was otherwise without abnormality. Complications:            No immediate complications. Estimated blood loss:                            Minimal. Estimated Blood Loss:     Estimated blood loss was minimal. Impression:                - The examined portion of the ileum was normal.                           - Diverticulosis in the sigmoid colon.                           - Two 4 to 6 mm polyps in the ascending colon,                            removed with a cold snare. Resected and retrieved.                           - Three 3 to 4 mm polyps in the sigmoid colon,  removed with a cold snare. Resected and retrieved.                           - Internal hemorrhoids.                           - Tortous colon                           - The examination was otherwise normal. Recommendation:           - Patient has a contact number available for                            emergencies. The signs and symptoms of potential                            delayed complications were discussed with the                            patient. Return to normal activities tomorrow.                            Written discharge instructions were provided to the                            patient.                           - Resume previous diet.                           - Continue present medications.                           - Await pathology results. Remo Lipps P. Armbruster, MD 11/06/2018 2:07:16 PM This report has been signed electronically.

## 2018-11-06 NOTE — Patient Instructions (Signed)
Handouts Provided:  Polyps  YOU HAD AN ENDOSCOPIC PROCEDURE TODAY AT THE [facility]:   Refer to the procedure report that was given to you for any specific questions about what was found during the examination.  If the procedure report does not answer your questions, please call your gastroenterologist to clarify.  If you requested that your care partner not be given the details of your procedure findings, then the procedure report has been included in a sealed envelope for you to review at your convenience later.  YOU SHOULD EXPECT: Some feelings of bloating in the abdomen. Passage of more gas than usual.  Walking can help get rid of the air that was put into your GI tract during the procedure and reduce the bloating. If you had a lower endoscopy (such as a colonoscopy or flexible sigmoidoscopy) you may notice spotting of blood in your stool or on the toilet paper. If you underwent a bowel prep for your procedure, you may not have a normal bowel movement for a few days.  Please Note:  You might notice some irritation and congestion in your nose or some drainage.  This is from the oxygen used during your procedure.  There is no need for concern and it should clear up in a day or so.  SYMPTOMS TO REPORT IMMEDIATELY:   Following lower endoscopy (colonoscopy or flexible sigmoidoscopy):  Excessive amounts of blood in the stool  Significant tenderness or worsening of abdominal pains  Swelling of the abdomen that is new, acute  Fever of 100F or higher  For urgent or emergent issues, a gastroenterologist can be reached at any hour by calling [phone].   DIET:  We do recommend a small meal at first, but then you may proceed to your regular diet.  Drink plenty of fluids but you should avoid alcoholic beverages for 24 hours.  ACTIVITY:  You should plan to take it easy for the rest of today and you should NOT DRIVE or use heavy machinery until tomorrow (because of the sedation  medicines used during the test).    FOLLOW UP: Our staff will call the number listed on your records the next business day following your procedure to check on you and address any questions or concerns that you may have regarding the information given to you following your procedure. If we do not reach you, we will leave a message.  However, if you are feeling well and you are not experiencing any problems, there is no need to return our call.  We will assume that you have returned to your regular daily activities without incident.  If any biopsies were taken you will be contacted by phone or by letter within the next 1-3 weeks.  Please call us at [phone] if you have not heard about the biopsies in 3 weeks.    SIGNATURES/CONFIDENTIALITY: You and/or your care partner have signed paperwork which will be entered into your electronic medical record.  These signatures attest to the fact that that the information above on your After Visit Summary has been reviewed and is understood.  Full responsibility of the confidentiality of this discharge information lies with you and/or your care-partner.  

## 2018-11-09 ENCOUNTER — Telehealth: Payer: Self-pay | Admitting: *Deleted

## 2018-11-09 NOTE — Telephone Encounter (Signed)
  Follow up Call-  Call back number 11/06/2018  Post procedure Call Back phone  # [phone]  Permission to leave phone message Yes  Some recent data might be hidden     Patient questions:  Do you have a fever, pain , or abdominal swelling? No. Pain Score  0 *  Have you tolerated food without any problems? Yes.    Have you been able to return to your normal activities? Yes.    Do you have any questions about your discharge instructions: Diet   No. Medications  No. Follow up visit  No.  Do you have questions or concerns about your Care? No.  Actions: * If pain score is 4 or above: No action needed, pain <4.

## 2019-01-18 ENCOUNTER — Encounter: Payer: Self-pay | Admitting: Internal Medicine

## 2019-01-21 ENCOUNTER — Encounter: Payer: Self-pay | Admitting: Internal Medicine

## 2019-01-21 ENCOUNTER — Ambulatory Visit (INDEPENDENT_AMBULATORY_CARE_PROVIDER_SITE_OTHER): Admitting: Internal Medicine

## 2019-01-21 VITALS — BP 118/62 | HR 70 | Temp 98.5°F | Resp 16 | Ht 62.0 in | Wt 153.4 lb

## 2019-01-21 DIAGNOSIS — F411 Generalized anxiety disorder: Secondary | ICD-10-CM | POA: Diagnosis not present

## 2019-01-21 DIAGNOSIS — R200 Anesthesia of skin: Secondary | ICD-10-CM | POA: Diagnosis not present

## 2019-01-21 DIAGNOSIS — Z0000 Encounter for general adult medical examination without abnormal findings: Secondary | ICD-10-CM | POA: Diagnosis not present

## 2019-01-21 DIAGNOSIS — Z1231 Encounter for screening mammogram for malignant neoplasm of breast: Secondary | ICD-10-CM

## 2019-01-21 DIAGNOSIS — Z1272 Encounter for screening for malignant neoplasm of vagina: Secondary | ICD-10-CM

## 2019-01-21 DIAGNOSIS — Z01419 Encounter for gynecological examination (general) (routine) without abnormal findings: Secondary | ICD-10-CM

## 2019-01-21 LAB — COMPREHENSIVE METABOLIC PANEL
ALBUMIN: 4.5 g/dL (ref 3.5–5.2)
ALT: 22 U/L (ref 0–35)
AST: 21 U/L (ref 0–37)
Alkaline Phosphatase: 69 U/L (ref 39–117)
BILIRUBIN TOTAL: 0.5 mg/dL (ref 0.2–1.2)
BUN: 18 mg/dL (ref 6–23)
CO2: 28 mEq/L (ref 19–32)
CREATININE: 0.83 mg/dL (ref 0.40–1.20)
Calcium: 9.9 mg/dL (ref 8.4–10.5)
Chloride: 105 mEq/L (ref 96–112)
GFR: 69.82 mL/min (ref >60.00)
Glucose, Bld: 83 mg/dL (ref 70–99)
Potassium: 4.2 mEq/L (ref 3.5–5.1)
SODIUM: 140 meq/L (ref 135–145)
Total Protein: 7 g/dL (ref 6.0–8.3)

## 2019-01-21 LAB — B12 AND FOLATE PANEL
Folate: >24 ng/mL (ref >5.9)
Vitamin B-12: 617 pg/mL (ref 211–911)

## 2019-01-21 LAB — CBC WITH DIFFERENTIAL/PLATELET
BASOS PCT: 0.8 % (ref 0.0–3.0)
Basophils Absolute: 0 10*3/uL (ref 0.0–0.1)
EOS ABS: 0.1 10*3/uL (ref 0.0–0.7)
EOS PCT: 2.2 % (ref 0.0–5.0)
HCT: 41.8 % (ref 36.0–46.0)
Hemoglobin: 14 g/dL (ref 12.0–15.0)
LYMPHS PCT: 39.6 % (ref 12.0–46.0)
Lymphs Abs: 2 10*3/uL (ref 0.7–4.0)
MCHC: 33.4 g/dL (ref 30.0–36.0)
MCV: 90.4 fl (ref 78.0–100.0)
MONOS PCT: 7.8 % (ref 3.0–12.0)
Monocytes Absolute: 0.4 10*3/uL (ref 0.1–1.0)
Neutro Abs: 2.5 10*3/uL (ref 1.4–7.7)
Neutrophils Relative %: 49.6 % (ref 43.0–77.0)
PLATELETS: 278 10*3/uL (ref 150.0–400.0)
RBC: 4.63 Mil/uL (ref 3.87–5.11)
RDW: 14 % (ref 11.5–15.5)
WBC: 5.1 10*3/uL (ref 4.0–10.5)

## 2019-01-21 LAB — TSH: TSH: 5.65 u[IU]/mL — ABNORMAL HIGH (ref 0.35–4.50)

## 2019-01-21 LAB — LIPID PANEL
CHOLESTEROL: 229 mg/dL — AB (ref 0–200)
HDL: 58.9 mg/dL (ref >39.00)
LDL Cholesterol: 150 mg/dL — ABNORMAL HIGH (ref 0–99)
NonHDL: 170.19
Total CHOL/HDL Ratio: 4
Triglycerides: 103 mg/dL (ref 0.0–149.0)
VLDL: 20.6 mg/dL (ref 0.0–40.0)

## 2019-01-21 MED ORDER — ALPRAZOLAM 0.25 MG PO TABS: 0.2500 mg | ORAL_TABLET | Freq: Every evening | ORAL | 1 refills | Status: DC | PRN

## 2019-01-21 NOTE — Progress Notes (Signed)
Pre visit review using our clinic review tool, if applicable. No additional management support is needed unless otherwise documented below in the visit note. 

## 2019-01-21 NOTE — Patient Instructions (Signed)
GO TO THE LAB : Get the blood work     GO TO THE FRONT DESK Schedule your next appointment for a checkup in 4 months.  Start a gradual exercise program.  Goal: Exercise 3 hours a week  Calorie counting?  Myfitnes pal?

## 2019-01-21 NOTE — Assessment & Plan Note (Addendum)
--  Tdap: allergic; flu shot : got very sick w/ previous flu shot, x 24-48 h post vaccination, will hold off --Female care  Pap-- 06/07/15 with Elon Alas, NP- negative.  Refer to gynecology [facility]-- 06/2017 (-), refer to [facility]-- 07/03/10 with Delfin Edis, MD- benign polyps removed; cscope 10/2018 , next per GI -EKG: NSR -- + sedentary lifestyle:  counseled! - (+) Wt gain counseled, cal counting?  Dietitian information provided - Labs: CMP, CBC, TSH, FLP, T19, folic acid

## 2019-01-21 NOTE — Assessment & Plan Note (Signed)
Anxiety, insomnia: Well-controlled with Xanax, takes on average once a week, previously prescribed by gynecology, I will take over.  Contract signed. DOE: Going on for year.  Had a chest x-ray few months ago with the only finding was a question of cardiomegaly.  EKG NSR.  No associated cardiopulmonary symptoms except occ palpitations.  She has a sedentary lifestyle.  Suspect deconditioning.  Recommend gradual exercise program.  If not better will proceed with further eval. Paresthesia, right foot.  Unclear etiology, entrapment neuropathy?  Radiculopathy (also has some calf tightness).  Others?  We will check X73 folic acid and reassess in few months. RTC 4 months.

## 2019-01-21 NOTE — Progress Notes (Signed)
Subjective:    Patient ID: Debra Norton, female    DOB: 11/27/1956, 63 y.o.   MRN: [id number]  DOS:  01/21/2019 Type of visit - description: cpx In general feeling well, she does have some concerns   Review of Systems Long history of DOE, for years, no associated with cough, wheezing, lower extremity edema or chest pain.  Some palpitations. She admits to a sedentary lifestyle. Occasional dizziness, at baseline In the last few months has developed an area of numbness at the top of the right foot. Denies any ankle pain or knee pain.  From time to time her right calf feels "tight" but no swelling.  No back pain.  Other than above, a 14 point review of systems is negative    Past Medical History:  Diagnosis Date  . Anxiety   . Cervical dysplasia   . Cholelithiasis    53mm stone seen on ultrasound in 2009  . Colon polyp   . Diverticulitis   . Migraine headache   . Vertigo     Past Surgical History:  Procedure Laterality Date  . CHOLECYSTECTOMY    . COLPOSCOPY    . ECTOPIC PREGNANCY SURGERY    . EXTERNAL EAR SURGERY Left   . GYNECOLOGIC CRYOSURGERY      Social History   Socioeconomic History  . Marital status: Widowed    Spouse name: Not on file  . Number of children: 0  . Years of education: Not on file  . Highest education level: Not on file  Occupational History  . 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 Financial resource strain: Not on file  . Food insecurity:    Worry: Not on file    Inability: Not on file  . Transportation needs:    Medical: Not on file    Non-medical: Not on file  Tobacco Use  . Smoking status: Former Research scientist (life sciences)  . Smokeless tobacco: Never Used  . Tobacco comment: quit in high School  Substance and Sexual Activity  . Alcohol use: Yes    Alcohol/week: 2.0 standard drinks    Types: 2 Standard drinks or equivalent per week    Comment: socially  . Drug use: No  . Sexual activity: Yes    Comment: INTERCOURSE AGE 84, SEXUAL  PARTNERS  MORE THAN 5  Lifestyle  . Physical activity:    Days per week: Not on file    Minutes per session: Not on file  . Stress: Not on file  Relationships  . Social connections:    Talks on phone: Not on file    Gets together: Not on file    Attends religious service: Not on file    Active member of club or organization: Not on file    Attends meetings of clubs or organizations: Not on file    Relationship status: Not on file  . Intimate partner violence:    Fear of current or ex partner: Not on file    Emotionally abused: Not on file    Physically abused: Not on file    Forced sexual activity: Not on file  Other Topics Concern  . Not on file  Social History Narrative   Lives with boy friend   G4 P0    miscarriage x 3    ectopic x 1       Allergies as of 01/21/2019      Reactions   Diphenhydramine Hcl    REACTION: VERY WIRED/palpitations  Benadryl  Flagyl [metronidazole] Nausea And Vomiting   Penicillins    REACTION: HIVES   Tetanus Toxoid    REACTION: REALLY BAD SWELLING   Clindamycin/lincomycin Rash   Rash shortly after she finished a round of abx , see OV note 05-10-15   Hydrocodone    Insomnia, itching (mild)      Medication List       Accurate as of January 21, 2019  6:00 PM. Always use your most recent med list.        ALPRAZolam 0.25 MG tablet Commonly known as:  XANAX Take 1 tablet (0.25 mg total) by mouth at bedtime as needed for sleep.   MULTIMINERAL PLUS Tabs Take 2 tablets by mouth daily.   PROBIOTIC PO Take 1 capsule by mouth daily.   VITA-C PO Take 1 tablet by mouth daily.   VITAMIN D3 PO Take 1 tablet by mouth daily.      Family History  Problem Relation Age of Onset  . Colon cancer Mother        in her 22s  . Diabetes Sister   . Ovarian cancer Cousin        Mat. 1st cousin  . CAD Father        81  . Breast cancer Neg Hx   . Esophageal cancer Neg Hx   . Pancreatic cancer Neg Hx         Objective:   Physical  Exam Musculoskeletal:       Feet:    BP 118/62 (BP Location: Left Arm, Patient Position: Sitting, Cuff Size: Small)   Pulse 70   Temp 98.5 F (36.9 C) (Oral)   Resp 16   Ht 5\' 2"  (1.575 m)   Wt 153 lb 6 oz (69.6 kg)   SpO2 98%   BMI 28.05 kg/m  General: Well developed, NAD, BMI noted Neck: No  thyromegaly  HEENT:  Normocephalic . Face symmetric, atraumatic Lungs:  CTA B Normal respiratory effort, no intercostal retractions, no accessory muscle use. Heart: RRR,  no murmur.  No pretibial edema bilaterally.  Calves symmetric, nontender Abdomen:  Not distended, soft, non-tender. No rebound or rigidity.   Skin: Exposed areas without rash. Not pale. Not jaundice Neurologic:  alert & oriented X3.  Speech normal, gait appropriate for age and unassisted Strength symmetric and appropriate for age. DTRs symmetric.  Pinprick examination of the feet normal.  At the area of numbness she reports that the pinprick is less noticeable/different.  Psych: Cognition and judgment appear intact.  Cooperative with normal attention span and concentration.  Behavior appropriate. No anxious or depressed appearing.     Assessment     Assessment Anxiety / insomnia: xanax at night, used to be rx by gen, rx by PCP starting 01/2019 H/o dizziness: 2016, saw neurology, EEG was negative. H/o cervical dysplasia H/o OSA ( Not on CPAP, states the second sleep study did not show any improvement with CPAP use) Diverticulitis 2019  PLAN: Anxiety, insomnia: Well-controlled with Xanax, takes on average once a week, previously prescribed by gynecology, I will take over.  Contract signed. DOE: Going on for year.  Had a chest x-ray few months ago with the only finding was a question of cardiomegaly.  EKG NSR.  No associated cardiopulmonary symptoms except occ palpitations.  She has a sedentary lifestyle.  Suspect deconditioning.  Recommend gradual exercise program.  If not better will proceed with further  eval. Paresthesia, right foot.  Unclear etiology, entrapment neuropathy?  Radiculopathy (also has some  calf tightness).  Others?  We will check S30 folic acid and reassess in few months. RTC 4 months.

## 2019-06-22 LAB — HM MAMMOGRAPHY

## 2019-06-23 ENCOUNTER — Encounter: Payer: Self-pay | Admitting: Internal Medicine

## 2019-07-21 ENCOUNTER — Telehealth: Payer: Self-pay

## 2019-07-21 NOTE — Telephone Encounter (Signed)
-----   Message from Roetta Sessions, [place] sent at 03/29/2019  5:56 PM EDT ----- Regarding: FW: labs  ----- Message ----- From: Yetta Flock, MD Sent: 03/29/2019   5:25 PM EDT To: Roetta Sessions, CMA Subject: RE: labs                                       YEs can push it to August given LFTs in 2/20 were normal. Thanks! ----- Message ----- From: Roetta Sessions, CMA Sent: 03/29/2019   5:14 PM EDT To: Yetta Flock, MD Subject: labs                                           Dr. Havery Moros - can we push this out a month?    ----- Message ----- From: Larina Bras, CMA Sent: 03/29/2019 To: Roetta Sessions, CMA  Needs repeat lfts around 04/14/19... See 10/13/18 lab notes

## 2019-07-21 NOTE — Progress Notes (Signed)
Letter sent to pt - Due for labs -LFTs.

## 2019-07-21 NOTE — Telephone Encounter (Signed)
Letter sent.

## 2019-08-05 ENCOUNTER — Telehealth: Payer: Self-pay | Admitting: Internal Medicine

## 2019-08-05 DIAGNOSIS — F411 Generalized anxiety disorder: Secondary | ICD-10-CM

## 2019-08-05 NOTE — Telephone Encounter (Signed)
Alprazolam Rx.  Last OV: 01/21/2019 Last Fill; 01/21/2019 #30 and 1RF UDS: 01/21/2019 None

## 2019-08-05 NOTE — Telephone Encounter (Signed)
sent 

## 2019-08-18 ENCOUNTER — Other Ambulatory Visit (INDEPENDENT_AMBULATORY_CARE_PROVIDER_SITE_OTHER)

## 2019-08-18 ENCOUNTER — Other Ambulatory Visit: Payer: Self-pay

## 2019-08-18 DIAGNOSIS — R748 Abnormal levels of other serum enzymes: Secondary | ICD-10-CM | POA: Diagnosis not present

## 2019-08-18 LAB — HEPATIC FUNCTION PANEL
ALT: 14 U/L (ref 0–35)
AST: 21 U/L (ref 0–37)
Albumin: 4.2 g/dL (ref 3.5–5.2)
Alkaline Phosphatase: 64 U/L (ref 39–117)
Bilirubin, Direct: 0 mg/dL (ref 0.0–0.3)
Total Bilirubin: 0.3 mg/dL (ref 0.2–1.2)
Total Protein: 7.1 g/dL (ref 6.0–8.3)

## 2019-08-18 NOTE — Progress Notes (Signed)
lft order entered

## 2020-01-10 ENCOUNTER — Encounter: Payer: Self-pay | Admitting: Internal Medicine

## 2020-03-27 ENCOUNTER — Telehealth: Admitting: Family

## 2020-03-27 DIAGNOSIS — Z7189 Other specified counseling: Secondary | ICD-10-CM

## 2020-03-27 NOTE — Progress Notes (Signed)
Based on what you shared with me, I feel your condition warrants further evaluation and I recommend that you be seen for a face to face office visit.  Given you have questions about allergies, it would be best to seen face to face to be elevated.    NOTE: If you entered your credit card information for this eVisit, you will not be charged. You may see a "hold" on your card for the $35 but that hold will drop off and you will not have a charge processed.   If you are having a true medical emergency please call 911.      For an urgent face to face visit, [place] [facility] has five urgent care centers for your convenience:      NEW:  [facility] [place] Get Driving Directions [id number] [place], [place] [address] . 10 am - 6pm Monday - Friday    [facility]) Get Driving Directions [id number] [address] [place], [place] [address] . 10 am to 8 pm Monday-Friday . 12 pm to 8 pm [facility] Get Driving Directions [id number] [place], [place], [place] [address] . 8 am to 8 pm Monday-Friday . 9 am to 6 pm Saturday . 11 am to 6 pm Sunday     [facility] Get Driving Directions  [id number] [address] [place], [place] [address] . 8 am to 8 pm Monday-Friday . 8 am to 4 pm [facility] at [place] Get Driving Directions [id number] [place] [place], [place] [address] . 12 pm to 6 pm Monday-Friday      Your e-visit answers were reviewed by a board certified advanced clinical practitioner to complete your personal care plan.  Thank you for using e-Visits.

## 2020-03-30 ENCOUNTER — Telehealth (INDEPENDENT_AMBULATORY_CARE_PROVIDER_SITE_OTHER): Admitting: Internal Medicine

## 2020-03-30 ENCOUNTER — Encounter: Payer: Self-pay | Admitting: Internal Medicine

## 2020-03-30 ENCOUNTER — Other Ambulatory Visit: Payer: Self-pay

## 2020-03-30 VITALS — HR 106 | Ht 62.0 in | Wt 153.0 lb

## 2020-03-30 DIAGNOSIS — G4452 New daily persistent headache (NDPH): Secondary | ICD-10-CM

## 2020-03-30 DIAGNOSIS — R5383 Other fatigue: Secondary | ICD-10-CM | POA: Diagnosis not present

## 2020-03-30 NOTE — Progress Notes (Signed)
Pre visit review using our clinic review tool, if applicable. No additional management support is needed unless otherwise documented below in the visit note. 

## 2020-03-30 NOTE — Progress Notes (Signed)
Subjective:    Patient ID: Debra Norton, female    DOB: Jan 30, 1956, 64 y.o.   MRN: [id number]  DOS:  03/30/2020 Type of visit - description: Virtual Visit via Telephone  Attempted  to make this a video visit, due to technical difficulties from the patient side it was not possible  thus we proceeded with a Virtual Visit via Telephone    I connected with above mentioned patient  by telephone and verified that I am speaking with the correct person using two identifiers.  THIS ENCOUNTER IS A VIRTUAL VISIT DUE TO COVID-19 - PATIENT WAS NOT SEEN IN THE OFFICE. PATIENT HAS CONSENTED TO VIRTUAL VISIT / TELEMEDICINE VISIT   Location of patient: home  Location of provider: office  I discussed the limitations, risks, security and privacy concerns of performing an evaluation and management service by telephone and the availability of in person appointments. I also discussed with the patient that there may be a patient responsible charge related to this service. The patient expressed understanding and agreed to proceed.   Acute Had her first [place] Covid vaccination, immediately after developed some mild headaches and fatigue. Had her second shot on 03/15/2020: Symptoms got much worse. Increase headaches, located at different areas of the head:  left or right, front or sides; HAs are  daily, last few hours, not better with OTCs.  Fatigue got worse  She feels like a mental fog, she cannot focus, for instance a few days ago she almost  ran a red light .     Review of Systems Denies any fever chills. No rash no myalgias Some nausea but no vomiting. She also reports being more thirsty lately and craving sugars but no weight loss or polyuria. Admits to mild cough. Stress is slightly higher lately. Denies any double vision, slurred speech or focal weaknesses. Neck stiffness?  She said maybe.  Past Medical History:  Diagnosis Date  . Anxiety   . Cervical dysplasia   . Cholelithiasis     63mm stone seen on ultrasound in 2009  . Colon polyp   . Diverticulitis   . Migraine headache   . Vertigo     Past Surgical History:  Procedure Laterality Date  . CHOLECYSTECTOMY    . COLPOSCOPY    . ECTOPIC PREGNANCY SURGERY    . EXTERNAL EAR SURGERY Left   . GYNECOLOGIC CRYOSURGERY      Allergies as of 03/30/2020      Reactions   Diphenhydramine Hcl    REACTION: VERY WIRED/palpitations  Benadryl   Flagyl [metronidazole] Nausea And Vomiting   Penicillins    REACTION: HIVES   Tetanus Toxoid    REACTION: REALLY BAD SWELLING   Clindamycin/lincomycin Rash   Rash shortly after she finished a round of abx , see OV note 05-10-15   Hydrocodone    Insomnia, itching (mild)      Medication List       Accurate as of March 30, 2020  9:48 AM. If you have any questions, ask your nurse or doctor.        ALPRAZolam 0.25 MG tablet Commonly known as: XANAX TAKE 1 TABLET(0.25 MG) BY MOUTH AT BEDTIME AS NEEDED FOR SLEEP   Multimineral Plus Tabs Take 2 tablets by mouth daily.   PROBIOTIC PO Take 1 capsule by mouth daily.   VITA-C PO Take 1 tablet by mouth daily.   VITAMIN D3 PO Take 1 tablet by mouth daily.  Objective:   Physical Exam Pulse (!) 106   Ht 5\' 2"  (1.575 m)   Wt 153 lb (69.4 kg)   SpO2 97%   BMI 27.98 kg/m  This is a virtual telephone visit, she is alert oriented x3, speech is fluid, she is coherent and seems at baseline    Assessment     Assessment Anxiety / insomnia: xanax at night, used to be rx by gen, rx by PCP starting 01/2019 H/o dizziness: 2016, saw neurology, EEG was negative. H/o cervical dysplasia H/o OSA ( Not on CPAP, states the second sleep study did not show any improvement with CPAP use) Diverticulitis 2019  PLAN: Headaches, fatigue. Symptoms as described above, no history of previous cephalalgia's except for migraines in high school thus  this is a new onset of headache. This happened in the context of recent [place]  vaccination, unclear if there is a relationship. Plan: CMP, CBC, sed rate, CT head. Call or ER if symptoms increase or severe Reassess in person next week   I discussed the assessment and treatment plan with the patient. The patient was provided an opportunity to ask questions and all were answered. The patient agreed with the plan and demonstrated an understanding of the instructions.   The patient was advised to call back or seek an in-person evaluation if the symptoms worsen or if the condition fails to improve as anticipated.  I provided 25 minutes of non-face-to-face time during this encounter.  Kathlene November, MD

## 2020-03-31 ENCOUNTER — Other Ambulatory Visit: Payer: Self-pay

## 2020-03-31 ENCOUNTER — Ambulatory Visit (HOSPITAL_BASED_OUTPATIENT_CLINIC_OR_DEPARTMENT_OTHER)
Admission: RE | Admit: 2020-03-31 | Discharge: 2020-03-31 | Disposition: A | Discharge status: Home / Self Care | Source: Ambulatory Visit | Attending: Internal Medicine | Admitting: Internal Medicine

## 2020-03-31 ENCOUNTER — Other Ambulatory Visit (INDEPENDENT_AMBULATORY_CARE_PROVIDER_SITE_OTHER)

## 2020-03-31 DIAGNOSIS — G4452 New daily persistent headache (NDPH): Secondary | ICD-10-CM | POA: Insufficient documentation

## 2020-03-31 LAB — CBC WITH DIFFERENTIAL/PLATELET
Basophils Absolute: 0 10*3/uL (ref 0.0–0.1)
Basophils Relative: 0.6 % (ref 0.0–3.0)
Eosinophils Absolute: 0.1 10*3/uL (ref 0.0–0.7)
Eosinophils Relative: 2.5 % (ref 0.0–5.0)
HCT: 38.5 % (ref 36.0–46.0)
Hemoglobin: 13.1 g/dL (ref 12.0–15.0)
Lymphocytes Relative: 39.6 % (ref 12.0–46.0)
Lymphs Abs: 2 10*3/uL (ref 0.7–4.0)
MCHC: 34.1 g/dL (ref 30.0–36.0)
MCV: 89.9 fl (ref 78.0–100.0)
Monocytes Absolute: 0.4 10*3/uL (ref 0.1–1.0)
Monocytes Relative: 7.4 % (ref 3.0–12.0)
Neutro Abs: 2.5 10*3/uL (ref 1.4–7.7)
Neutrophils Relative %: 49.9 % (ref 43.0–77.0)
Platelets: 262 10*3/uL (ref 150.0–400.0)
RBC: 4.28 Mil/uL (ref 3.87–5.11)
RDW: 13.1 % (ref 11.5–15.5)
WBC: 5.1 10*3/uL (ref 4.0–10.5)

## 2020-03-31 LAB — COMPREHENSIVE METABOLIC PANEL
ALT: 14 U/L (ref 0–35)
AST: 17 U/L (ref 0–37)
Albumin: 4.2 g/dL (ref 3.5–5.2)
Alkaline Phosphatase: 67 U/L (ref 39–117)
BUN: 17 mg/dL (ref 6–23)
CO2: 26 mEq/L (ref 19–32)
Calcium: 9.4 mg/dL (ref 8.4–10.5)
Chloride: 107 mEq/L (ref 96–112)
Creatinine, Ser: 0.78 mg/dL (ref 0.40–1.20)
GFR: 74.71 mL/min (ref >60.00)
Glucose, Bld: 88 mg/dL (ref 70–99)
Potassium: 4.2 mEq/L (ref 3.5–5.1)
Sodium: 142 mEq/L (ref 135–145)
Total Bilirubin: 0.5 mg/dL (ref 0.2–1.2)
Total Protein: 6.5 g/dL (ref 6.0–8.3)

## 2020-03-31 LAB — SEDIMENTATION RATE: Sed Rate: 29 mm/hr (ref 0–30)

## 2020-03-31 NOTE — Assessment & Plan Note (Signed)
Headaches, fatigue. Symptoms as described above, no history of previous cephalalgia's except for migraines in high school thus  this is a new onset of headache. This happened in the context of recent [place] vaccination, unclear if there is a relationship. Plan: CMP, CBC, sed rate, CT head. Call or ER if symptoms increase or severe Reassess in person next week

## 2020-04-06 ENCOUNTER — Encounter: Payer: Self-pay | Admitting: Internal Medicine

## 2020-04-06 ENCOUNTER — Other Ambulatory Visit: Payer: Self-pay

## 2020-04-06 ENCOUNTER — Ambulatory Visit (INDEPENDENT_AMBULATORY_CARE_PROVIDER_SITE_OTHER): Admitting: Internal Medicine

## 2020-04-06 VITALS — BP 124/62 | HR 72 | Temp 96.8°F | Resp 16 | Ht 62.0 in | Wt 156.0 lb

## 2020-04-06 DIAGNOSIS — E785 Hyperlipidemia, unspecified: Secondary | ICD-10-CM

## 2020-04-06 DIAGNOSIS — Z8249 Family history of ischemic heart disease and other diseases of the circulatory system: Secondary | ICD-10-CM

## 2020-04-06 DIAGNOSIS — R5383 Other fatigue: Secondary | ICD-10-CM | POA: Diagnosis not present

## 2020-04-06 DIAGNOSIS — R7989 Other specified abnormal findings of blood chemistry: Secondary | ICD-10-CM

## 2020-04-06 LAB — LIPID PANEL
Cholesterol: 220 mg/dL — ABNORMAL HIGH (ref 0–200)
HDL: 56.8 mg/dL (ref >39.00)
LDL Cholesterol: 145 mg/dL — ABNORMAL HIGH (ref 0–99)
NonHDL: 163.55
Total CHOL/HDL Ratio: 4
Triglycerides: 93 mg/dL (ref 0.0–149.0)
VLDL: 18.6 mg/dL (ref 0.0–40.0)

## 2020-04-06 LAB — T3, FREE: T3, Free: 3.3 pg/mL (ref 2.3–4.2)

## 2020-04-06 LAB — TSH: TSH: 5.17 u[IU]/mL — ABNORMAL HIGH (ref 0.35–4.50)

## 2020-04-06 LAB — T4, FREE: Free T4: 0.75 ng/dL (ref 0.60–1.60)

## 2020-04-06 NOTE — Progress Notes (Signed)
Pre visit review using our clinic review tool, if applicable. No additional management support is needed unless otherwise documented below in the visit note. 

## 2020-04-06 NOTE — Patient Instructions (Signed)
  Get your Covid testing by calling [phone] Or text "[place]" to [phone]   GO TO THE LAB : Get the blood work     Debra Norton, [place] back for   a checkup in 6 weeks

## 2020-04-06 NOTE — Progress Notes (Signed)
Subjective:    Patient ID: Debra Norton, female    DOB: 11-24-1956, 64 y.o.   MRN: [id number]  DOS:  04/06/2020 Type of visit - description: Follow-up Multiple issues discussed  See last office visit, since then she still has some headaches, lack of balance, tiredness and mental fog. The symptoms are not worse.  We discussed the results of the head CT  Continue with paresthesia of the right foot  Recently her sister had 4 stents placed in, she is concerned about her FH CAD.    Review of Systems Denies chest pain  Past Medical History:  Diagnosis Date  . Anxiety   . Cervical dysplasia   . Cholelithiasis    26mm stone seen on ultrasound in 2009  . Colon polyp   . Diverticulitis   . Migraine headache   . Vertigo     Past Surgical History:  Procedure Laterality Date  . CHOLECYSTECTOMY    . COLPOSCOPY    . ECTOPIC PREGNANCY SURGERY    . EXTERNAL EAR SURGERY Left   . GYNECOLOGIC CRYOSURGERY      Allergies as of 04/06/2020      Reactions   Diphenhydramine Hcl    REACTION: VERY WIRED/palpitations  Benadryl   Flagyl [metronidazole] Nausea And Vomiting   Penicillins    REACTION: HIVES   Tetanus Toxoid    REACTION: REALLY BAD SWELLING   Clindamycin/lincomycin Rash   Rash shortly after she finished a round of abx , see OV note 05-10-15   Hydrocodone    Insomnia, itching (mild)      Medication List       Accurate as of April 06, 2020  9:42 AM. If you have any questions, ask your nurse or doctor.        ALPRAZolam 0.25 MG tablet Commonly known as: XANAX TAKE 1 TABLET(0.25 MG) BY MOUTH AT BEDTIME AS NEEDED FOR SLEEP   Multimineral Plus Tabs Take 2 tablets by mouth daily.   PROBIOTIC PO Take 1 capsule by mouth daily.   VITA-C PO Take 1 tablet by mouth daily.   VITAMIN D3 PO Take 1 tablet by mouth daily.          Objective:   Physical Exam BP 124/62 (BP Location: Left Arm, Patient Position: Sitting, Cuff Size: Small)   Pulse 72   Temp  (!) 96.8 F (36 C) (Temporal)   Resp 16   Ht 5\' 2"  (1.575 m)   Wt 156 lb (70.8 kg)   SpO2 98%   BMI 28.53 kg/m  General:   Well developed, NAD, BMI noted. HEENT:  Normocephalic . Face symmetric, atraumatic Neck: Full range of motion Lungs:  CTA B Normal respiratory effort, no intercostal retractions, no accessory muscle use. Heart: RRR,  no murmur.  Lower extremities: no pretibial edema bilaterally  Skin: Not pale. Not jaundice Neurologic:  alert & oriented X3.  Speech normal, gait appropriate for age and unassisted EOMI, pupils equal and reactive Psych--  Cognition and judgment appear intact.  Cooperative with normal attention span and concentration.  Behavior appropriate. No anxious or depressed appearing.      Assessment       Assessment Anxiety / insomnia: xanax at night, used to be rx by gen, rx by PCP starting 01/2019 H/o dizziness: 2016, saw neurology, EEG was negative. H/o cervical dysplasia H/o OSA ( Not on CPAP, states the second sleep study did not show any improvement with CPAP use) Diverticulitis 2019 FH CAD: Father , younger sister  MI in her 5s  PLAN: Headache, fatigue Since the last visit, labs normal and  CT head normal for age. Results are reassuring, symptoms happen at in the context of 2 Covid vaccinations. Symptoms are decreasing slowly. Plan: For completeness we will ask the patient to be tested for Covid PCR. See AVS. 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 Treatment is CV RF control. FH CAD: Father has a second set of stents recently, her younger sister (currently in her 85s) had a MI last week and 4 stents. Pt  is asymptomatic, EKG today: NSR, few T wave inversions, unchanged from previous. Cholesterol was moderately elevated currently on diet control. Plan: FLP, calcium coronary score to further assess her risk and possible need to start  statins. Hyperlipidemia: Diet controlled, see above. Paresthesia, right foot:See note from 01/21/2019, area of paresthesia has increased to some extent. Etiology was unclear but TSH was a slightly elevated, recheck. Consider neurology referral particularly if she continue with headache and brain fog. RTC 6 weeks   This visit occurred during the SARS-CoV-2 public health emergency.  Safety protocols were in place, including screening questions prior to the visit, additional usage of staff PPE, and extensive cleaning of exam room while observing appropriate contact time as indicated for disinfecting solutions.

## 2020-04-08 NOTE — Assessment & Plan Note (Signed)
Headache, fatigue Since the last visit, labs normal and  CT head normal for age. Results are reassuring, symptoms happen at in the context of 2 Covid vaccinations. Symptoms are decreasing slowly. Plan: For completeness we will ask the patient to be tested for Covid PCR. See AVS. 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 Treatment is CV RF control. FH CAD: Father has a second set of stents recently, her younger sister (currently in her 78s) had a MI last week and 4 stents. Pt  is asymptomatic, EKG today: NSR, few T wave inversions, unchanged from previous. Cholesterol was moderately elevated currently on diet control. Plan: FLP, calcium coronary score to further assess her risk and possible need to start statins. Hyperlipidemia: Diet controlled, see above. Paresthesia, right foot:See note from 01/21/2019, area of paresthesia has increased to some extent. Etiology was unclear but TSH was a slightly elevated, recheck. Consider neurology referral particularly if she continue with headache and brain fog. RTC 6 weeks

## 2020-04-10 MED ORDER — ATORVASTATIN CALCIUM 40 MG PO TABS
40.0000 mg | ORAL_TABLET | Freq: Every day | ORAL | 1 refills | Status: DC
Start: 2020-04-10 — End: 2020-06-12

## 2020-04-10 NOTE — Addendum Note (Signed)
Addended byDamita Dunnings D on: 04/10/2020 07:44 AM   Modules accepted: Orders

## 2020-04-13 ENCOUNTER — Encounter: Payer: Self-pay | Admitting: Internal Medicine

## 2020-04-14 ENCOUNTER — Encounter (HOSPITAL_COMMUNITY): Payer: Self-pay

## 2020-04-14 ENCOUNTER — Ambulatory Visit (HOSPITAL_COMMUNITY)

## 2020-05-18 ENCOUNTER — Other Ambulatory Visit: Payer: Self-pay

## 2020-05-18 ENCOUNTER — Encounter: Payer: Self-pay | Admitting: Internal Medicine

## 2020-05-18 ENCOUNTER — Ambulatory Visit (INDEPENDENT_AMBULATORY_CARE_PROVIDER_SITE_OTHER): Admitting: Internal Medicine

## 2020-05-18 VITALS — BP 122/65 | HR 83 | Temp 96.1°F | Resp 18 | Ht 62.0 in | Wt 156.5 lb

## 2020-05-18 DIAGNOSIS — Z8249 Family history of ischemic heart disease and other diseases of the circulatory system: Secondary | ICD-10-CM | POA: Diagnosis not present

## 2020-05-18 DIAGNOSIS — Z79899 Other long term (current) drug therapy: Secondary | ICD-10-CM

## 2020-05-18 DIAGNOSIS — E785 Hyperlipidemia, unspecified: Secondary | ICD-10-CM | POA: Diagnosis not present

## 2020-05-18 DIAGNOSIS — R202 Paresthesia of skin: Secondary | ICD-10-CM

## 2020-05-18 DIAGNOSIS — F411 Generalized anxiety disorder: Secondary | ICD-10-CM | POA: Diagnosis not present

## 2020-05-18 LAB — LIPID PANEL
Cholesterol: 133 mg/dL (ref 0–200)
HDL: 58.8 mg/dL (ref >39.00)
LDL Cholesterol: 62 mg/dL (ref 0–99)
NonHDL: 74.34
Total CHOL/HDL Ratio: 2
Triglycerides: 63 mg/dL (ref 0.0–149.0)
VLDL: 12.6 mg/dL (ref 0.0–40.0)

## 2020-05-18 LAB — AST: AST: 28 U/L (ref 0–37)

## 2020-05-18 LAB — ALT: ALT: 39 U/L — ABNORMAL HIGH (ref 0–35)

## 2020-05-18 NOTE — Progress Notes (Signed)
Subjective:    Patient ID: Debra Norton, female    DOB: 09-12-1956, 64 y.o.   MRN: [id number]  DOS:  05/18/2020 Type of visit - description: Follow-up Since the last office visit she is feeling well. Multiple issues discussed today.     Review of Systems Denies chest pain no difficulty breathing Continue with paresthesias right foot, area is getting larger. No pain at the ankle.  Past Medical History:  Diagnosis Date   Anxiety    Cervical dysplasia    Cholelithiasis    33mm stone seen on ultrasound in 2009   Colon polyp    Diverticulitis    Migraine headache    Vertigo     Past Surgical History:  Procedure Laterality Date   CHOLECYSTECTOMY     COLPOSCOPY     ECTOPIC PREGNANCY SURGERY     EXTERNAL EAR SURGERY Left    GYNECOLOGIC CRYOSURGERY      Allergies as of 05/18/2020      Reactions   Diphenhydramine Hcl    REACTION: VERY WIRED/palpitations  Benadryl   Flagyl [metronidazole] Nausea And Vomiting   Penicillins    REACTION: HIVES   Tetanus Toxoid    REACTION: REALLY BAD SWELLING   Clindamycin/lincomycin Rash   Rash shortly after she finished a round of abx , see OV note 05-10-15   Hydrocodone    Insomnia, itching (mild)      Medication List       Accurate as of May 18, 2020  9:55 AM. If you have any questions, ask your nurse or doctor.        ALPRAZolam 0.25 MG tablet Commonly known as: XANAX TAKE 1 TABLET(0.25 MG) BY MOUTH AT BEDTIME AS NEEDED FOR SLEEP   atorvastatin 40 MG tablet Commonly known as: LIPITOR Take 1 tablet (40 mg total) by mouth at bedtime.   Multimineral Plus Tabs Take 2 tablets by mouth daily.   PROBIOTIC PO Take 1 capsule by mouth daily.   VITA-C PO Take 1 tablet by mouth daily.   VITAMIN D3 PO Take 1 tablet by mouth daily.          Objective:   Physical Exam Musculoskeletal:       Feet:    BP 122/65 (BP Location: Left Arm, Patient Position: Sitting, Cuff Size: Small)    Pulse 83    Temp  (!) 96.1 F (35.6 C) (Temporal)    Resp 18    Ht 5\' 2"  (1.575 m)    Wt 156 lb 8 oz (71 kg)    SpO2 98%    BMI 28.62 kg/m  General:   Well developed, NAD, BMI noted. HEENT:  Normocephalic . Face symmetric, atraumatic Lungs:  CTA B Normal respiratory effort, no intercostal retractions, no accessory muscle use. Heart: RRR,  no murmur.  Lower extremities: no pretibial edema bilaterally  Skin: Not pale. Not jaundice Neurologic:  alert & oriented X3.  Speech normal, gait appropriate for age and unassisted Psych--  Cognition and judgment appear intact.  Cooperative with normal attention span and concentration.  Behavior appropriate. No anxious or depressed appearing.      Assessment    Assessment Anxiety / insomnia: xanax at night, used to be rx by gyn, rx by PCP starting 01/2019 H/o dizziness: 2016, saw neurology, EEG was negative. H/o cervical dysplasia H/o OSA ( Not on CPAP, states the second sleep study did not show any improvement with CPAP use) Diverticulitis 2019 FH CAD: Father , younger sister MI in  her 18s  PLAN: Headache, fatigue: See last visit, symptoms resolved FH CAD: Strong family history, unable to pursue calcium coronary score, patient is still concerned would like further evaluation, referred to cardiology.  Start aspirin 81 mg daily Hyperlipidemia: Started Lipitor, checking labs Anxiety: on Xanax, check UDS Paresthesia, right foot: Area of sharp and sometimes painful paresthesia has increased.  Denies any ankle pain.  Entrapment neuropathy?  Refer to Ortho. RTC 6 months   This visit occurred during the SARS-CoV-2 public health emergency.  Safety protocols were in place, including screening questions prior to the visit, additional usage of staff PPE, and extensive cleaning of exam room while observing appropriate contact time as indicated for disinfecting solutions.

## 2020-05-18 NOTE — Progress Notes (Signed)
Pre visit review using our clinic review tool, if applicable. No additional management support is needed unless otherwise documented below in the visit note. 

## 2020-05-18 NOTE — Patient Instructions (Addendum)
Continue the same medications  Add aspirin 81 mg 1 tablet daily  GO TO THE LAB : Get the blood work     [place], [place] back for a checkup in 6 months

## 2020-05-19 NOTE — Assessment & Plan Note (Signed)
Headache, fatigue: See last visit, symptoms resolved FH CAD: Strong family history, unable to pursue calcium coronary score, patient is still concerned would like further evaluation, referred to cardiology.  Start aspirin 81 mg daily Hyperlipidemia: Started Lipitor, checking labs Anxiety: on Xanax, check UDS Paresthesia, right foot: Area of sharp and sometimes painful paresthesia has increased.  Denies any ankle pain.  Entrapment neuropathy?  Refer to Ortho. RTC 6 months

## 2020-05-20 LAB — DRUG MONITORING, PANEL 8 WITH CONFIRMATION, URINE
6 Acetylmorphine: NEGATIVE ng/mL (ref <10)
Alcohol Metabolites: NEGATIVE ng/mL
Amphetamines: NEGATIVE ng/mL (ref <500)
Benzodiazepines: NEGATIVE ng/mL (ref <100)
Benzoylecgonine: 153 ng/mL — ABNORMAL HIGH (ref <100)
Buprenorphine, Urine: NEGATIVE ng/mL (ref <5)
Cocaine Metabolite: POSITIVE ng/mL — AB (ref <150)
Creatinine: 87.3 mg/dL
MDMA: NEGATIVE ng/mL (ref <500)
Marijuana Metabolite: NEGATIVE ng/mL (ref <20)
Opiates: NEGATIVE ng/mL (ref <100)
Oxidant: NEGATIVE ug/mL
Oxycodone: NEGATIVE ng/mL (ref <100)
pH: 5.8 (ref 4.5–9.0)

## 2020-05-20 LAB — DM TEMPLATE

## 2020-06-04 NOTE — Progress Notes (Signed)
Cardiology Office Note:    Date:  06/05/2020   ID:  Debra Norton, DOB Aug 06, 1956, MRN [id number]  PCP:  Colon Branch, MD  Cardiologist:  Shirlee More, MD   Referring MD: Colon Branch, MD  ASSESSMENT:    1. Cardiac risk counseling   2. Pure hypertriglyceridemia    PLAN:    In order of problems listed above:  She has good healthcare literacy who both agree that a cardiac calcium score be additive estimate her risk and will be performed as an outpatient.  I will have her return in 6 weeks to review the results if severely elevated would need an ischemia evaluation.  She will continue her statin lipid-lowering therapy on treatment lipids are ideal Next appointment   Medication Adjustments/Labs and Tests Ordered: Current medicines are reviewed at length with the patient today.  Concerns regarding medicines are outlined above.  No orders of the defined types were placed in this encounter.  No orders of the defined types were placed in this encounter.   Is concerned about her cardiovascular risk and interested in cardiac CT calcium score  History of Present Illness:    Debra Norton is a 65 y.o. female who is being seen today for the evaluation of cardiovascular risk at the request of Colon Branch, MD.  She has very good healthcare literacy is concerned about cardiovascular risk and inquires about calcium score.  She tried to have it ordered in her PCP office insurance declined.  I explained to her that in general is not reimbursable but as an outpatient out-of-pocket expense is about $150 and she asked to have it done.  [place] 8 decades ago she was told she had a heart attack by EKG criteria not admitted to the hospital her EKG in our office is normal.  When she does activities like playing on the trampoline with grandchildren climbing stairs she is short of breath but no chest pain palpitation or syncope.  She has no history of congenital rheumatic heart disease or  heart murmur.  Her risk score does not account for family history of a sister having PCI at age 66 mother with arrhythmia father with heart disease in his 42s with PCI and stent.  Her 10 yr CV risk is low at 3.9%. Past Medical History:  Diagnosis Date  . Anxiety   . Cervical dysplasia   . Cholelithiasis    56mm stone seen on ultrasound in 2009  . Colon polyp   . Diverticulitis   . Migraine headache   . Vertigo     Past Surgical History:  Procedure Laterality Date  . CHOLECYSTECTOMY    . COLPOSCOPY    . ECTOPIC PREGNANCY SURGERY    . EXTERNAL EAR SURGERY Left   . GYNECOLOGIC CRYOSURGERY      Current Medications: Current Meds  Medication Sig  . ALPRAZolam (XANAX) 0.25 MG tablet TAKE 1 TABLET(0.25 MG) BY MOUTH AT BEDTIME AS NEEDED FOR SLEEP  . Ascorbic Acid (VITA-C PO) Take 1 tablet by mouth daily.  Marland Kitchen aspirin EC 81 MG tablet Take 81 mg by mouth daily.  Marland Kitchen atorvastatin (LIPITOR) 40 MG tablet Take 1 tablet (40 mg total) by mouth at bedtime.  . Multiple Vitamins-Minerals (MULTIMINERAL PLUS) TABS Take 2 tablets by mouth daily.  . Probiotic Product (PROBIOTIC PO) Take 1 capsule by mouth daily.     Allergies:   Diphenhydramine hcl, Flagyl [metronidazole], Penicillins, Tetanus toxoid, Clindamycin/lincomycin, and Hydrocodone   Social History  Socioeconomic History  . Marital status: Widowed    Spouse name: Not on file  . Number of children: 0  . Years of education: Not on file  . Highest education level: Not on file  Occupational History  . 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 Smoking status: Former Research scientist (life sciences)  . Smokeless tobacco: Never Used  . Tobacco comment: quit in high School  Vaping Use  . Vaping Use: Never used  Substance and Sexual Activity  . Alcohol use: Yes    Alcohol/week: 2.0 standard drinks    Types: 2 Standard drinks or equivalent per week    Comment: socially  . Drug use: No  . Sexual activity: Yes    Comment: INTERCOURSE AGE 19, SEXUAL PARTNERS   MORE THAN 5  Other Topics Concern  . Not on file  Social History Narrative   Lives with boy friend   G4 P0    miscarriage x 3    ectopic x 1    Social Determinants of Health   Financial Resource Strain:   . Difficulty of Paying Living Expenses:   Food Insecurity:   . Worried About Charity fundraiser in the Last Year:   . Arboriculturist in the Last Year:   Transportation Needs:   . Film/video editor (Medical):   Marland Kitchen Lack of Transportation (Non-Medical):   Physical Activity:   . Days of Exercise per Week:   . Minutes of Exercise per Session:   Stress:   . Feeling of Stress :   Social Connections:   . Frequency of Communication with Friends and Family:   . Frequency of Social Gatherings with Friends and Family:   . Attends Religious Services:   . Active Member of Clubs or Organizations:   . Attends Archivist Meetings:   Marland Kitchen Marital Status:      Family History: The patient's family history includes CAD in her father; Colon cancer in her mother; Diabetes in her sister; Ovarian cancer in her cousin. There is no history of Breast cancer, Esophageal cancer, or Pancreatic cancer.  ROS:   Review of Systems  Constitutional: Negative.  HENT: Negative.   Eyes: Negative.   Cardiovascular: Positive for dyspnea on exertion.  Respiratory: Positive for shortness of breath.   Endocrine: Negative.   Hematologic/Lymphatic: Negative.   Skin: Negative.   Musculoskeletal: Negative.   Gastrointestinal: Negative.   Genitourinary: Negative.   Neurological: Negative.   Psychiatric/Behavioral: Negative.   Allergic/Immunologic: Negative.    Please see the history of present illness.     All other systems reviewed and are negative.  EKGs/Labs/Other Studies Reviewed:    The following studies were reviewed today:   EKG:  EKG is  ordered today.  The ekg ordered today is personally reviewed and demonstrates sinus rhythm and is normal  Recent Labs: 03/31/2020: BUN 17;  Creatinine, Ser 0.78; Hemoglobin 13.1; Platelets 262.0; Potassium 4.2; Sodium 142 04/06/2020: TSH 5.17 05/18/2020: ALT 39  Recent Lipid Panel    Component Value Date/Time   CHOL 133 05/18/2020 1018   TRIG 63.0 05/18/2020 1018   HDL 58.80 05/18/2020 1018   CHOLHDL 2 05/18/2020 1018   VLDL 12.6 05/18/2020 1018   LDLCALC 62 05/18/2020 1018   LDLDIRECT 146.5 06/14/2010 1032    Physical Exam:    VS:  BP 108/70   Pulse 68   Ht 5\' 2"  (1.575 m)   Wt 154 lb (69.9 kg)   SpO2 98%   BMI  28.17 kg/m     Wt Readings from Last 3 Encounters:  06/05/20 154 lb (69.9 kg)  05/18/20 156 lb 8 oz (71 kg)  04/06/20 156 lb (70.8 kg)     GEN: No xanthoma or xanthelasma well nourished, well developed in no acute distress HEENT: Normal NECK: No JVD; No carotid bruits LYMPHATICS: No lymphadenopathy CARDIAC: RRR, no murmurs, rubs, gallops RESPIRATORY:  Clear to auscultation without rales, wheezing or rhonchi  ABDOMEN: Soft, non-tender, non-distended MUSCULOSKELETAL:  No edema; No deformity  SKIN: Warm and dry NEUROLOGIC:  Alert and oriented x 3 PSYCHIATRIC:  Normal affect     Signed, Shirlee More, MD  06/05/2020 2:03 PM    [place]

## 2020-06-05 ENCOUNTER — Encounter: Payer: Self-pay | Admitting: Cardiology

## 2020-06-05 ENCOUNTER — Ambulatory Visit (INDEPENDENT_AMBULATORY_CARE_PROVIDER_SITE_OTHER): Admitting: Cardiology

## 2020-06-05 ENCOUNTER — Other Ambulatory Visit: Payer: Self-pay

## 2020-06-05 VITALS — BP 108/70 | HR 68 | Ht 62.0 in | Wt 154.0 lb

## 2020-06-05 DIAGNOSIS — E7800 Pure hypercholesterolemia, unspecified: Secondary | ICD-10-CM | POA: Diagnosis not present

## 2020-06-05 DIAGNOSIS — Z7189 Other specified counseling: Secondary | ICD-10-CM | POA: Diagnosis not present

## 2020-06-05 NOTE — Patient Instructions (Addendum)
Medication Instructions:  Your physician recommends that you continue on your current medications as directed. Please refer to the Current Medication list given to you today.  *If you need a refill on your cardiac medications before your next appointment, please call your pharmacy*   Lab Work: None If you have labs (blood work) drawn today and your tests are completely normal, you will receive your results only by:  [place] (if you have MyChart) OR  A paper copy in the mail If you have any lab test that is abnormal or we need to change your treatment, we will call you to review the results.   Testing/Procedures: We have put in an order for you to have a CT Calcium score. They will call you to schedule this appointment.    Follow-Up: At [facility], you and your health needs are our priority.  As part of our continuing mission to provide you with exceptional heart care, we have created designated Provider Care Teams.  These Care Teams include your primary Cardiologist (physician) and Advanced Practice Providers (APPs -  Physician Assistants and Nurse Practitioners) who all work together to provide you with the care you need, when you need it.  We recommend signing up for the patient portal called "MyChart".  Sign up information is provided on this After Visit Summary.  MyChart is used to connect with patients for Virtual Visits (Telemedicine).  Patients are able to view lab/test results, encounter notes, upcoming appointments, etc.  Non-urgent messages can be sent to your provider as well.   To learn more about what you can do with MyChart, go to [email or web address].    Your next appointment:   6 week(s)  The format for your next appointment:   In Person  Provider:   Shirlee More, MD   Other Instructions   Coronary Calcium Scan A coronary calcium scan is an imaging test used to look for deposits of plaque in the inner lining of the blood vessels of the heart  (coronary arteries). Plaque is made up of calcium, protein, and fatty substances. These deposits of plaque can partly clog and narrow the coronary arteries without producing any symptoms or warning signs. This puts a person at risk for a heart attack. This test is recommended for people who are at moderate risk for heart disease. The test can find plaque deposits before symptoms develop. Tell a health care provider about:  Any allergies you have.  All medicines you are taking, including vitamins, herbs, eye drops, creams, and over-the-counter medicines.  Any problems you or family members have had with anesthetic medicines.  Any blood disorders you have.  Any surgeries you have had.  Any medical conditions you have.  Whether you are pregnant or may be pregnant. What are the risks? Generally, this is a safe procedure. However, problems may occur, including:  Harm to a pregnant woman and her unborn baby. This test involves the use of radiation. Radiation exposure can be dangerous to a pregnant woman and her unborn baby. If you are pregnant or think you may be pregnant, you should not have this procedure done.  Slight increase in the risk of cancer. This is because of the radiation involved in the test. What happens before the procedure? Ask your health care provider for any specific instructions on how to prepare for this procedure. You may be asked to avoid products that contain caffeine, tobacco, or nicotine for 4 hours before the procedure. What happens during the procedure?  You will undress and remove any jewelry from your neck or chest.  You will put on a hospital gown.  Sticky electrodes will be placed on your chest. The electrodes will be connected to an electrocardiogram (ECG) machine to record a tracing of the electrical activity of your heart.  You will lie down on a curved bed that is attached to the [place].  You may be given medicine to slow down your heart rate so  that clear pictures can be created.  You will be moved into the CT scanner, and the CT scanner will take pictures of your heart. During this time, you will be asked to lie still and hold your breath for 2-3 seconds at a time while each picture of your heart is being taken. The procedure may vary among health care providers and hospitals. What happens after the procedure?  You can get dressed.  You can return to your normal activities.  It is up to you to get the results of your procedure. Ask your health care provider, or the department that is doing the procedure, when your results will be ready. Summary  A coronary calcium scan is an imaging test used to look for deposits of plaque in the inner lining of the blood vessels of the heart (coronary arteries). Plaque is made up of calcium, protein, and fatty substances.  Generally, this is a safe procedure. Tell your health care provider if you are pregnant or may be pregnant.  Ask your health care provider for any specific instructions on how to prepare for this procedure.  A CT scanner will take pictures of your heart.  You can return to your normal activities after the scan is done. This information is not intended to replace advice given to you by your health care provider. Make sure you discuss any questions you have with your health care provider. Document Revised: 06/22/2019 Document Reviewed: 06/22/2019 Elsevier Patient Education  [place].

## 2020-06-08 ENCOUNTER — Other Ambulatory Visit: Payer: Self-pay

## 2020-06-08 ENCOUNTER — Ambulatory Visit (INDEPENDENT_AMBULATORY_CARE_PROVIDER_SITE_OTHER)
Admission: RE | Admit: 2020-06-08 | Discharge: 2020-06-08 | Disposition: A | Discharge status: Home / Self Care | Payer: Self-pay | Source: Ambulatory Visit | Attending: Cardiology | Admitting: Cardiology

## 2020-06-08 ENCOUNTER — Telehealth: Payer: Self-pay

## 2020-06-08 DIAGNOSIS — Z7189 Other specified counseling: Secondary | ICD-10-CM

## 2020-06-08 NOTE — Telephone Encounter (Signed)
Spoke with patient regarding results and recommendation.  Patient verbalizes understanding and is agreeable to plan of care. Advised patient to call back with any issues or concerns.  

## 2020-06-08 NOTE — Telephone Encounter (Signed)
-----   Message from Richardo Priest, MD sent at 06/08/2020  4:29 PM EDT ----- Is a great result calcium score is 0 putting her at very low risk  It is not uncommon to see a small amount of pericardial fluid in normal individuals her thyroid is normal and I just do not think she requires any additional testing at this time.

## 2020-06-11 ENCOUNTER — Other Ambulatory Visit: Payer: Self-pay | Admitting: Internal Medicine

## 2020-06-28 ENCOUNTER — Encounter: Payer: Self-pay | Admitting: Internal Medicine

## 2020-07-27 NOTE — Progress Notes (Signed)
Cardiology Office Note:    Date:  07/28/2020   ID:  Debra Norton, DOB [birth year], MRN [id number]  PCP:  Colon Branch, MD  Cardiologist:  Shirlee More, MD    Referring MD: Colon Branch, MD    ASSESSMENT:    1. Cardiac risk counseling   2. Pure hypercholesterolemia    PLAN:    In order of problems listed above:  1. Incorporating cardiac CT score she is at low risk however with her family history and personal decision making she will continue lipid-lowering therapy and her lipids are ideal   Next appointment: As needed   Medication Adjustments/Labs and Tests Ordered: Current medicines are reviewed at length with the patient today.  Concerns regarding medicines are outlined above.  No orders of the defined types were placed in this encounter.  No orders of the defined types were placed in this encounter.   Chief Complaint  Patient presents with  . Follow-up    History of Present Illness:    Debra Norton is a 64 y.o. female with a hx of dyslipidemia  last seen 06/05/2020. Compliance with diet, lifestyle and medications: Yes  She has good healthcare literacy we reviewed the results of her cardiac CT score together her score is 0 putting her at low risk over the next 3 to 5 years of cardiac event.  The pericardial fluid described is insignificant and she is not hypothyroid.  She is concerned with her family history and has made decision to stay on lipid-lowering therapy and I think is the right one here and her lipids are ideal with recent cholesterol 133 LDL 62 HDL 58 ideal TSH 5.17  06/08/2020: IMPRESSION: Coronary calcium score of 0. Small circumferential pericardial effusion Past Medical History:  Diagnosis Date  . Anxiety   . Cervical dysplasia   . Cholelithiasis    42mm stone seen on ultrasound in 2009  . Colon polyp   . Diverticulitis   . Migraine headache   . Vertigo     Past Surgical History:  Procedure Laterality Date  .  CHOLECYSTECTOMY    . COLPOSCOPY    . ECTOPIC PREGNANCY SURGERY    . EXTERNAL EAR SURGERY Left   . GYNECOLOGIC CRYOSURGERY      Current Medications: Current Meds  Medication Sig  . ALPRAZolam (XANAX) 0.25 MG tablet TAKE 1 TABLET(0.25 MG) BY MOUTH AT BEDTIME AS NEEDED FOR SLEEP  . Ascorbic Acid (VITA-C PO) Take 1 tablet by mouth daily.  Marland Kitchen aspirin EC 81 MG tablet Take 81 mg by mouth daily.  Marland Kitchen atorvastatin (LIPITOR) 40 MG tablet Take 1 tablet (40 mg total) by mouth at bedtime.  . gabapentin (NEURONTIN) 100 MG capsule Take 100 mg by mouth daily.  . Multiple Vitamins-Minerals (MULTIMINERAL PLUS) TABS Take 2 tablets by mouth daily.  . Probiotic Product (PROBIOTIC PO) Take 1 capsule by mouth daily.     Allergies:   Diphenhydramine hcl, Flagyl [metronidazole], Penicillins, Tetanus toxoid, Clindamycin/lincomycin, and Hydrocodone   Social History   Socioeconomic History  . Marital status: Widowed    Spouse name: Not on file  . Number of children: 0  . Years of education: Not on file  . Highest education level: Not on file  Occupational History  . 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 Smoking status: Former Research scientist (life sciences)  . Smokeless tobacco: Never Used  . Tobacco comment: quit in high School  Vaping Use  . Vaping Use: Never used  Substance and  Sexual Activity  . Alcohol use: Yes    Alcohol/week: 2.0 standard drinks    Types: 2 Standard drinks or equivalent per week    Comment: socially  . Drug use: No  . Sexual activity: Yes    Comment: INTERCOURSE AGE 53, SEXUAL PARTNERS  MORE THAN 5  Other Topics Concern  . Not on file  Social History Narrative   Lives with boy friend   G4 P0    miscarriage x 3    ectopic x 1    Social Determinants of Health   Financial Resource Strain:   . Difficulty of Paying Living Expenses:   Food Insecurity:   . Worried About Charity fundraiser in the Last Year:   . Arboriculturist in the Last Year:   Transportation Needs:   . Lexicographer (Medical):   Marland Kitchen Lack of Transportation (Non-Medical):   Physical Activity:   . Days of Exercise per Week:   . Minutes of Exercise per Session:   Stress:   . Feeling of Stress :   Social Connections:   . Frequency of Communication with Friends and Family:   . Frequency of Social Gatherings with Friends and Family:   . Attends Religious Services:   . Active Member of Clubs or Organizations:   . Attends Archivist Meetings:   Marland Kitchen Marital Status:      Family History: The patient's family history includes CAD in her father; Colon cancer in her mother; Diabetes in her sister; Ovarian cancer in her cousin. There is no history of Breast cancer, Esophageal cancer, or Pancreatic cancer. ROS:   Please see the history of present illness.    All other systems reviewed and are negative.  EKGs/Labs/Other Studies Reviewed:    The following studies were reviewed today:   Recent Labs: 03/31/2020: BUN 17; Creatinine, Ser 0.78; Hemoglobin 13.1; Platelets 262.0; Potassium 4.2; Sodium 142 04/06/2020: TSH 5.17 05/18/2020: ALT 39  Recent Lipid Panel    Component Value Date/Time   CHOL 133 05/18/2020 1018   TRIG 63.0 05/18/2020 1018   HDL 58.80 05/18/2020 1018   CHOLHDL 2 05/18/2020 1018   VLDL 12.6 05/18/2020 1018   LDLCALC 62 05/18/2020 1018   LDLDIRECT 146.5 06/14/2010 1032    Physical Exam:    VS:  BP 110/64 (BP Location: Left Arm, Patient Position: Sitting, Cuff Size: Normal)   Pulse 84   Ht 5\' 2"  (1.575 m)   Wt 155 lb (70.3 kg)   SpO2 98%   BMI 28.35 kg/m     Wt Readings from Last 3 Encounters:  07/28/20 155 lb (70.3 kg)  06/05/20 154 lb (69.9 kg)  05/18/20 156 lb 8 oz (71 kg)     GEN:  Well nourished, well developed in no acute distress HEENT: Normal NECK: No JVD; No carotid bruits LYMPHATICS: No lymphadenopathy CARDIAC: RRR, no murmurs, rubs, gallops RESPIRATORY:  Clear to auscultation without rales, wheezing or rhonchi  ABDOMEN: Soft, non-tender,  non-distended MUSCULOSKELETAL:  No edema; No deformity  SKIN: Warm and dry NEUROLOGIC:  Alert and oriented x 3 PSYCHIATRIC:  Normal affect    Signed, Shirlee More, MD  07/28/2020 11:45 AM    [place]

## 2020-07-28 ENCOUNTER — Ambulatory Visit (INDEPENDENT_AMBULATORY_CARE_PROVIDER_SITE_OTHER): Admitting: Cardiology

## 2020-07-28 ENCOUNTER — Encounter: Payer: Self-pay | Admitting: Cardiology

## 2020-07-28 ENCOUNTER — Other Ambulatory Visit: Payer: Self-pay

## 2020-07-28 VITALS — BP 110/64 | HR 84 | Ht 62.0 in | Wt 155.0 lb

## 2020-07-28 DIAGNOSIS — E7800 Pure hypercholesterolemia, unspecified: Secondary | ICD-10-CM | POA: Diagnosis not present

## 2020-07-28 DIAGNOSIS — Z7189 Other specified counseling: Secondary | ICD-10-CM

## 2020-07-28 NOTE — Patient Instructions (Signed)

## 2020-09-25 ENCOUNTER — Telehealth: Admitting: Emergency Medicine

## 2020-09-25 DIAGNOSIS — J029 Acute pharyngitis, unspecified: Secondary | ICD-10-CM

## 2020-09-25 MED ORDER — CEPHALEXIN 500 MG PO CAPS: 500.0000 mg | ORAL_CAPSULE | Freq: Two times a day (BID) | ORAL | 0 refills | Status: AC

## 2020-09-25 NOTE — Progress Notes (Signed)
We are sorry that you are not feeling well.  Here is how we plan to help!  Based on what you have shared with me it is likely that you have strep pharyngitis.  Strep pharyngitis is inflammation and infection in the back of the throat.  This is an infection cause by bacteria and is treated with antibiotics.  I have prescribed Cephalexin 500 mg twice a day for 10 days. For throat pain, we recommend over the counter oral pain relief medications such as acetaminophen or aspirin, or anti-inflammatory medications such as ibuprofen or naproxen sodium. Topical treatments such as oral throat lozenges or sprays may be used as needed. Strep infections are not as easily transmitted as other respiratory infections, however we still recommend that you avoid close contact with loved ones, especially the very young and elderly.  Remember to wash your hands thoroughly throughout the day as this is the number one way to prevent the spread of infection and wipe down door knobs and counters with disinfectant.   Home Care:  Only take medications as instructed by your medical team.  Complete the entire course of an antibiotic.  Do not take these medications with alcohol.  A steam or ultrasonic humidifier can help congestion.  You can place a towel over your head and breathe in the steam from hot water coming from a faucet.  Avoid close contacts especially the very young and the elderly.  Cover your mouth when you cough or sneeze.  Always remember to wash your hands.  Get Help Right Away If:  You develop worsening fever or sinus pain.  You develop a severe head ache or visual changes.  Your symptoms persist after you have completed your treatment plan.  Make sure you  Understand these instructions.  Will watch your condition.  Will get help right away if you are not doing well or get worse.  Your e-visit answers were reviewed by a board certified advanced clinical practitioner to complete your personal  care plan.  Depending on the condition, your plan could have included both over the counter or prescription medications.  If there is a problem please reply  once you have received a response from your provider.  Your safety is important to [place].  If you have drug allergies check your prescription carefully.    You can use MyChart to ask questions about today's visit, request a non-urgent call back, or ask for a work or school excuse for 24 hours related to this e-Visit. If it has been greater than 24 hours you will need to follow up with your provider, or enter a new e-Visit to address those concerns.  You will get an e-mail in the next two days asking about your experience.  I hope that your e-visit has been valuable and will speed your recovery. Thank you for using e-visits.   **Please do not respond to this message unless you have follow up questions.** Greater than 5 but less than 10 minutes spent researching, coordinating, and implementing care for this patient today

## 2020-10-11 ENCOUNTER — Other Ambulatory Visit: Payer: Self-pay | Admitting: Internal Medicine

## 2020-10-22 IMAGING — CT CT HEAD W/O CM
3 series · 15 of 47 positions shown, 18 images · non-contrast
Comparison: Head CT 04/14/2009.

CLINICAL DATA: 63-year-old female with worsening headache following
[id number] vaccine.

EXAM:
CT HEAD WITHOUT CONTRAST
TECHNIQUE: Contiguous axial images were obtained from the base of the skull
through the vertex without intravenous contrast.

[Series 2: head wo · axial · 0.44mm/px · z∈[-136,-11]mm · 9 of 30 slices shown, 12 images]
[im 3/30  brain]
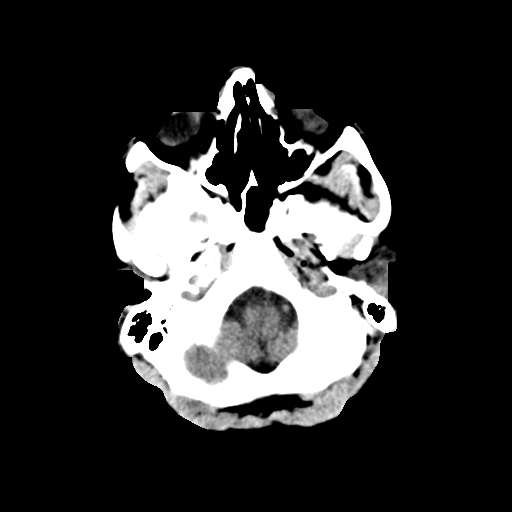
[im 3/30  bone]
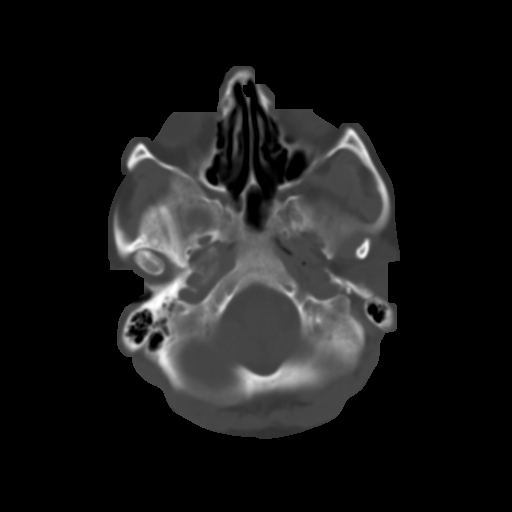
[im 6/30  brain]
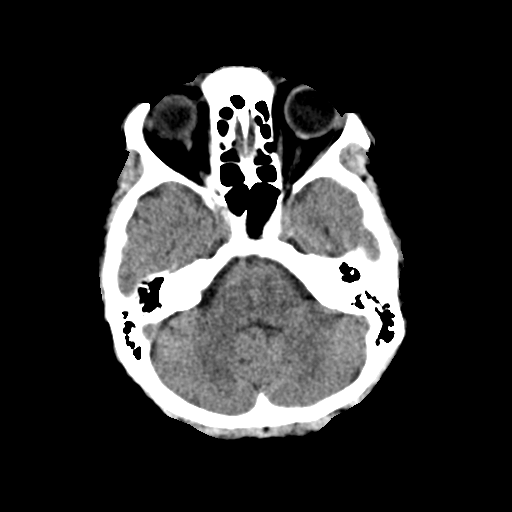
[im 9/30  brain]
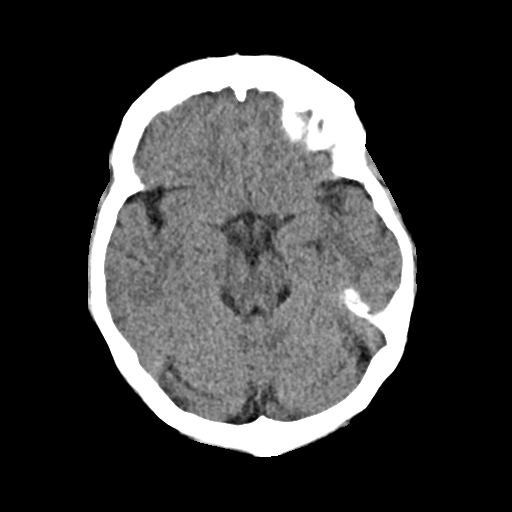
[im 12/30  brain]
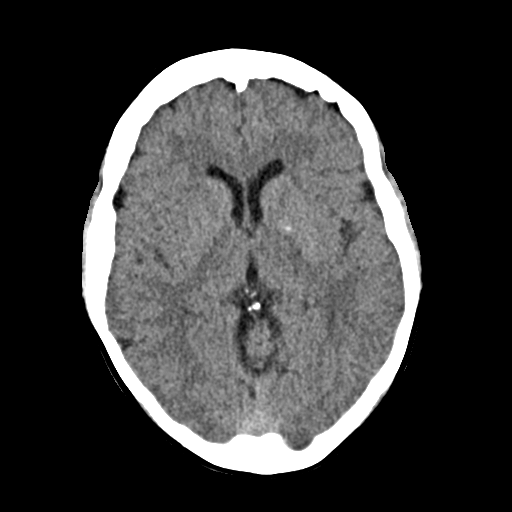
[im 16/30  brain]
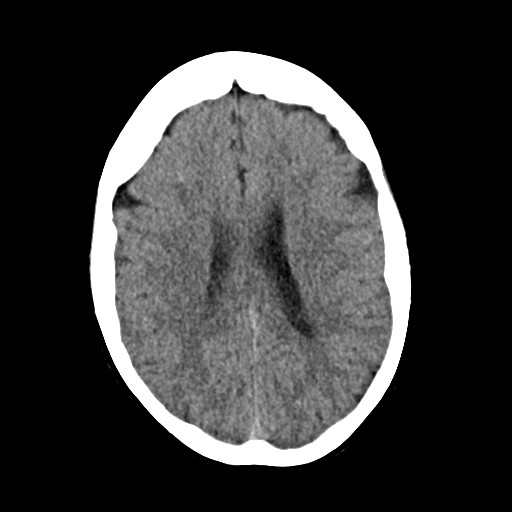
[im 16/30  bone]
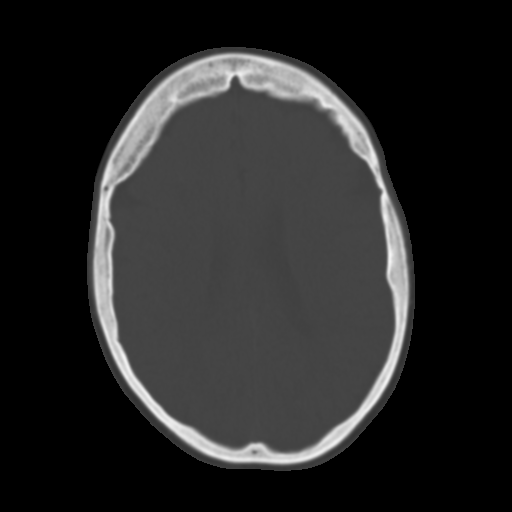
[im 19/30  brain]
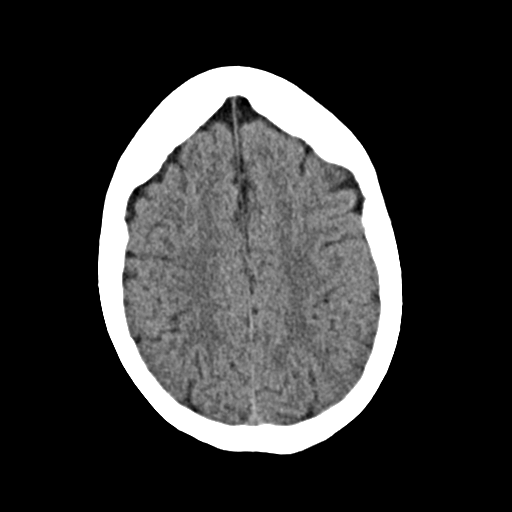
[im 22/30  brain]
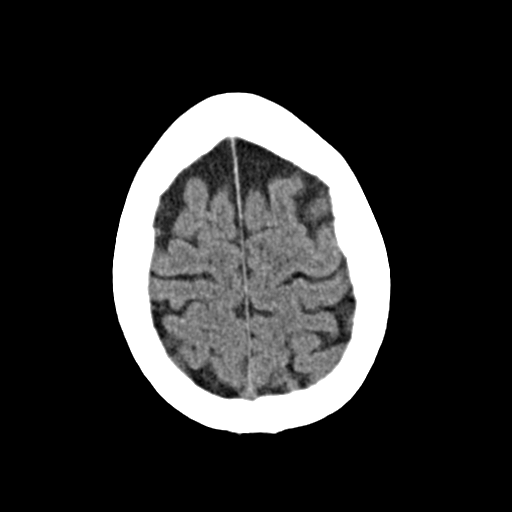
[im 25/30  brain]
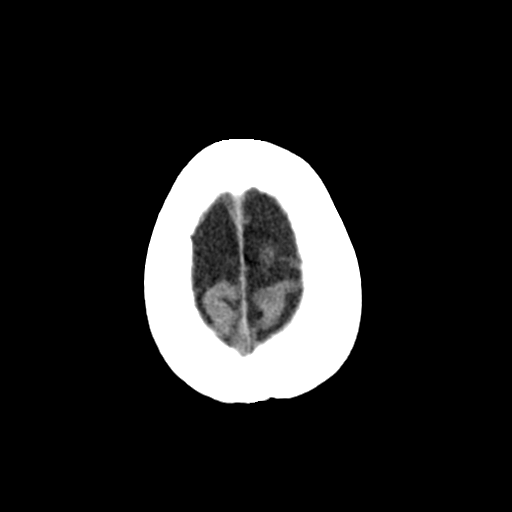
[im 28/30  brain]
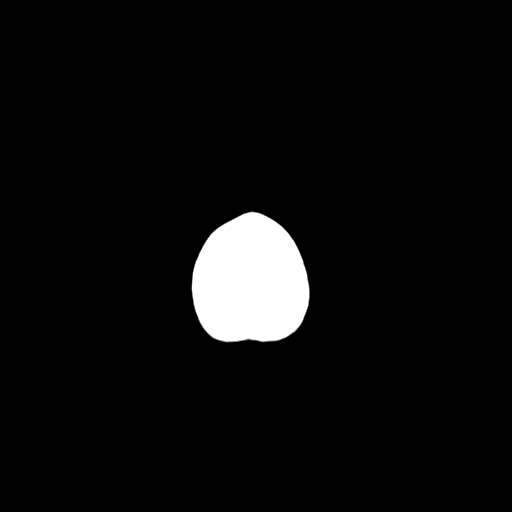
[im 28/30  bone]
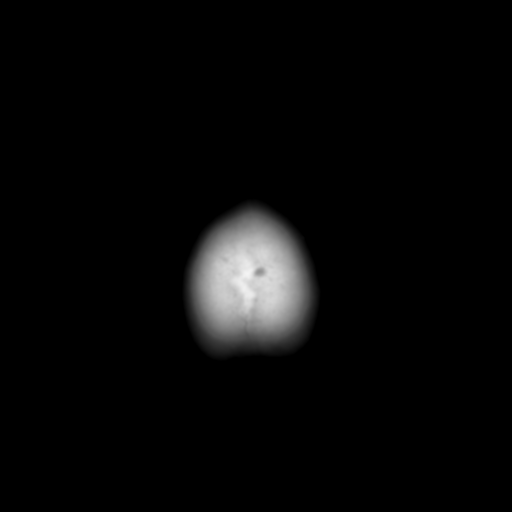

[Series 4: coronal soft · coronal · 0.29mm/px · 3 of 62 slices shown]
[im 21/62  brain]
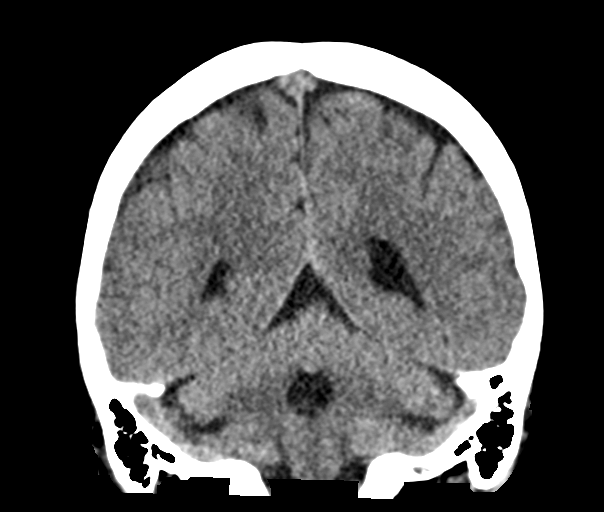
[im 28/62  brain]
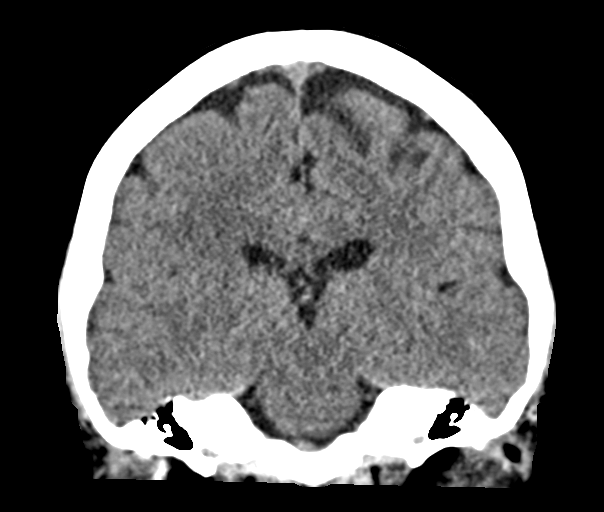
[im 34/62  brain]
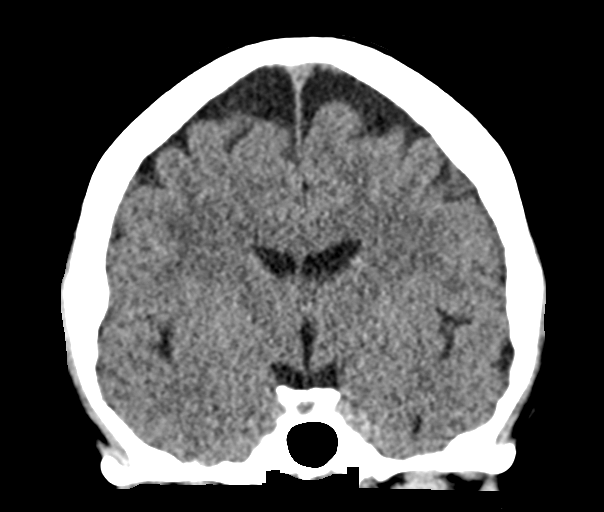

[Series 5: sag soft · sagittal · 0.30mm/px · 3 of 49 slices shown]
[im 17/49  brain]
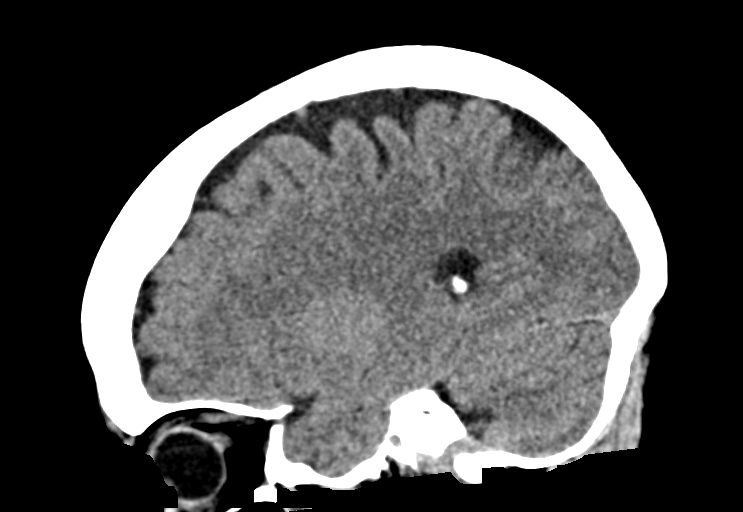
[im 25/49  brain]
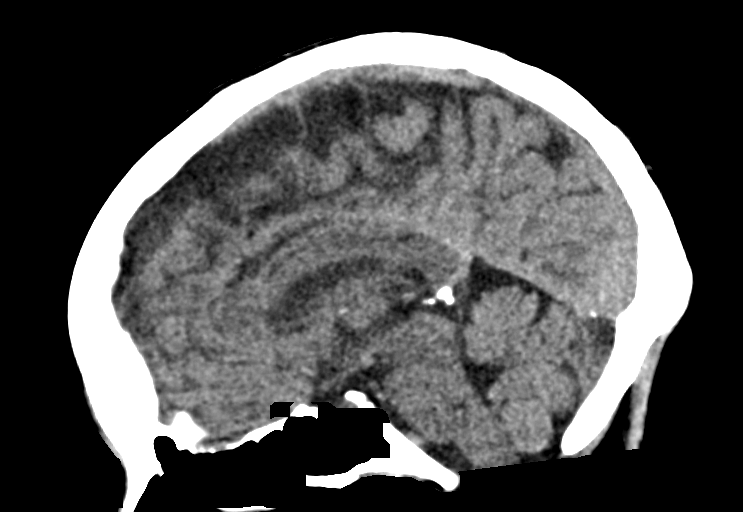
[im 33/49  brain]
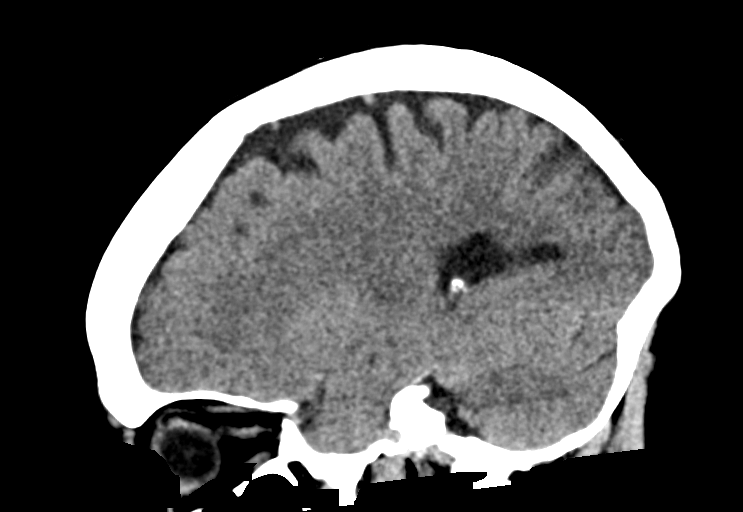

[15 of 47 positions shown; findings below may reference images not displayed]

FINDINGS: Brain: Patchy areas of decreased attenuation are noted throughout
the deep and periventricular white matter of the cerebral
hemispheres bilaterally, compatible with mild chronic microvascular
ischemic disease. Physiologic calcifications in the basal ganglia.
No evidence of acute infarction, hemorrhage, hydrocephalus,
extra-axial collection or mass lesion/mass effect.

Vascular: No hyperdense vessel or unexpected calcification.

Skull: Normal. Negative for fracture or focal lesion.

Sinuses/Orbits: No acute finding.

Other: None.
IMPRESSION: 1. No acute intracranial abnormalities.
2. Mild chronic microvascular ischemic changes in the cerebral white
matter, as above.

## 2021-01-02 ENCOUNTER — Ambulatory Visit (INDEPENDENT_AMBULATORY_CARE_PROVIDER_SITE_OTHER): Admitting: Psychology

## 2021-01-02 DIAGNOSIS — F4321 Adjustment disorder with depressed mood: Secondary | ICD-10-CM | POA: Diagnosis not present

## 2021-01-03 ENCOUNTER — Ambulatory Visit: Admitting: Nurse Practitioner

## 2021-01-03 ENCOUNTER — Encounter: Payer: Self-pay | Admitting: Nurse Practitioner

## 2021-01-03 ENCOUNTER — Other Ambulatory Visit: Payer: Self-pay

## 2021-01-03 ENCOUNTER — Ambulatory Visit (INDEPENDENT_AMBULATORY_CARE_PROVIDER_SITE_OTHER): Admitting: Nurse Practitioner

## 2021-01-03 VITALS — BP 98/60 | HR 72 | Resp 16 | Ht 62.0 in | Wt 155.0 lb

## 2021-01-03 DIAGNOSIS — Z01419 Encounter for gynecological examination (general) (routine) without abnormal findings: Secondary | ICD-10-CM | POA: Diagnosis not present

## 2021-01-03 DIAGNOSIS — F411 Generalized anxiety disorder: Secondary | ICD-10-CM | POA: Diagnosis not present

## 2021-01-03 DIAGNOSIS — Z13820 Encounter for screening for osteoporosis: Secondary | ICD-10-CM | POA: Diagnosis not present

## 2021-01-03 MED ORDER — ALPRAZOLAM 0.25 MG PO TABS: 0.2500 mg | ORAL_TABLET | Freq: Every evening | ORAL | 0 refills | Status: DC | PRN

## 2021-01-03 NOTE — Patient Instructions (Signed)

## 2021-01-03 NOTE — Progress Notes (Signed)
   Kwana Ringel Forte-Leroux 27-May-1956 [id number]   History:  65 y.o. G3P0030 presents for annual exam. Last seen in office in 2018. No GYN complaints. Postmenopausal - no HRT, no bleeding. Normal pap and mammogram history.   Gynecologic History No LMP recorded. Patient is postmenopausal.   Contraception/Family planning: post menopausal status  Health Maintenance Last Pap: 2016. Results were: normal Last mammogram: 06/2019. Results were: normal Last colonoscopy: 2019. Results were: polyps Last Dexa: 2006. Results were: normal  Past medical history, past surgical history, family history and social history were all reviewed and documented in the EPIC chart.  ROS:  A ROS was performed and pertinent positives and negatives are included.  Exam:  Vitals:   01/03/21 1129  BP: 98/60  Pulse: 72  Resp: 16  Weight: 155 lb (70.3 kg)  Height: 5\' 2"  (1.575 m)   Body mass index is 28.35 kg/m.  General appearance:  Normal Thyroid:  Symmetrical, normal in size, without palpable masses or nodularity. Respiratory  Auscultation:  Clear without wheezing or rhonchi Cardiovascular  Auscultation:  Regular rate, without rubs, murmurs or gallops  Edema/varicosities:  Not grossly evident Abdominal  Soft,nontender, without masses, guarding or rebound.  Liver/spleen:  No organomegaly noted  Hernia:  None appreciated  Skin  Inspection:  Grossly normal   Breasts: Examined lying and sitting.   Right: Without masses, retractions, discharge or axillary adenopathy.   Left: Without masses, retractions, discharge or axillary adenopathy. Gentitourinary   Inguinal/mons:  Normal without inguinal adenopathy  External genitalia:  Normal  BUS/Urethra/Skene's glands:  Normal  Vagina:  Normal  Cervix:  Normal  Uterus:  Normal in size, shape and contour.  Midline and mobile  Adnexa/parametria:     Rt: Without masses or tenderness.   Lt: Without masses or tenderness.  Anus and perineum: Normal  Digital  rectal exam: Normal sphincter tone without palpated masses or tenderness  Assessment/Plan:  65 y.o. G3P0030 for annual exam.   Well female exam with routine gynecological exam - .Education provided on SBEs, importance of preventative screenings, current guidelines, high calcium diet, regular exercise, and multivitamin daily. Labs with PCP.   Screening for osteoporosis - Plan: DG Bone Density. Normal DEXA in 2006.  Generalized anxiety disorder - Plan: ALPRAZolam (XANAX) 0.25 MG tablet as needed.  She is aware of addictive properties and to use sparingly.  #30 provided with no refills.  Screening for cervical cancer - Normal Pap history.  Last Pap in 2016.  Pap with high-risk HPV today.  Screening for breast cancer - Normal mammogram history.  Next mammogram scheduled for August 2022.  Normal breast exam today.  Screening for colon cancer -2019 colonoscopy.  Will repeat at GI's recommended interval.   Follow-up in 1 year for annual.  Tamela Gammon [facility], 11:46 AM 01/03/2021

## 2021-01-10 LAB — PAP, TP IMAGING W/ HPV RNA, RFLX HPV TYPE 16,18/45: HPV DNA High Risk: DETECTED — AB

## 2021-01-10 LAB — HPV TYPE 16 AND 18/45 RNA
HPV Type 16 RNA: NOT DETECTED
HPV Type 18/45 RNA: NOT DETECTED

## 2021-01-15 ENCOUNTER — Ambulatory Visit (INDEPENDENT_AMBULATORY_CARE_PROVIDER_SITE_OTHER): Admitting: Psychology

## 2021-01-15 DIAGNOSIS — F4321 Adjustment disorder with depressed mood: Secondary | ICD-10-CM

## 2021-01-30 ENCOUNTER — Ambulatory Visit: Admitting: Psychology

## 2021-02-07 ENCOUNTER — Other Ambulatory Visit: Payer: Self-pay | Admitting: Internal Medicine

## 2021-02-20 ENCOUNTER — Other Ambulatory Visit: Payer: Self-pay | Admitting: Nurse Practitioner

## 2021-02-20 ENCOUNTER — Ambulatory Visit (INDEPENDENT_AMBULATORY_CARE_PROVIDER_SITE_OTHER)

## 2021-02-20 ENCOUNTER — Other Ambulatory Visit: Payer: Self-pay

## 2021-02-20 DIAGNOSIS — Z780 Asymptomatic menopausal state: Secondary | ICD-10-CM

## 2021-02-20 DIAGNOSIS — M8588 Other specified disorders of bone density and structure, other site: Secondary | ICD-10-CM

## 2021-02-20 DIAGNOSIS — Z13820 Encounter for screening for osteoporosis: Secondary | ICD-10-CM

## 2021-03-04 ENCOUNTER — Encounter: Payer: Self-pay | Admitting: Nurse Practitioner

## 2021-03-08 ENCOUNTER — Ambulatory Visit (INDEPENDENT_AMBULATORY_CARE_PROVIDER_SITE_OTHER): Admitting: Psychology

## 2021-03-08 DIAGNOSIS — F4321 Adjustment disorder with depressed mood: Secondary | ICD-10-CM

## 2021-03-09 ENCOUNTER — Encounter: Payer: Self-pay | Admitting: Internal Medicine

## 2021-03-17 ENCOUNTER — Other Ambulatory Visit: Payer: Self-pay | Admitting: Internal Medicine

## 2021-04-11 ENCOUNTER — Ambulatory Visit (INDEPENDENT_AMBULATORY_CARE_PROVIDER_SITE_OTHER): Admitting: Psychology

## 2021-04-11 DIAGNOSIS — F4321 Adjustment disorder with depressed mood: Secondary | ICD-10-CM

## 2021-04-19 ENCOUNTER — Encounter: Payer: Self-pay | Admitting: Nurse Practitioner

## 2021-05-07 ENCOUNTER — Ambulatory Visit (INDEPENDENT_AMBULATORY_CARE_PROVIDER_SITE_OTHER): Admitting: Psychology

## 2021-05-07 DIAGNOSIS — F4321 Adjustment disorder with depressed mood: Secondary | ICD-10-CM | POA: Diagnosis not present

## 2021-07-03 ENCOUNTER — Ambulatory Visit (INDEPENDENT_AMBULATORY_CARE_PROVIDER_SITE_OTHER): Admitting: Psychology

## 2021-07-03 DIAGNOSIS — F4321 Adjustment disorder with depressed mood: Secondary | ICD-10-CM | POA: Diagnosis not present

## 2021-07-10 LAB — HM MAMMOGRAPHY

## 2021-07-12 ENCOUNTER — Encounter: Payer: Self-pay | Admitting: Internal Medicine

## 2021-07-24 ENCOUNTER — Ambulatory Visit: Admitting: Psychology

## 2021-08-07 ENCOUNTER — Ambulatory Visit (INDEPENDENT_AMBULATORY_CARE_PROVIDER_SITE_OTHER): Admitting: Psychology

## 2021-08-07 DIAGNOSIS — F4321 Adjustment disorder with depressed mood: Secondary | ICD-10-CM | POA: Diagnosis not present

## 2021-08-29 ENCOUNTER — Ambulatory Visit (INDEPENDENT_AMBULATORY_CARE_PROVIDER_SITE_OTHER): Admitting: Psychology

## 2021-08-29 DIAGNOSIS — F4321 Adjustment disorder with depressed mood: Secondary | ICD-10-CM | POA: Diagnosis not present

## 2021-09-26 ENCOUNTER — Ambulatory Visit (INDEPENDENT_AMBULATORY_CARE_PROVIDER_SITE_OTHER): Admitting: Psychology

## 2021-09-26 DIAGNOSIS — F4321 Adjustment disorder with depressed mood: Secondary | ICD-10-CM | POA: Diagnosis not present

## 2021-10-12 ENCOUNTER — Telehealth (INDEPENDENT_AMBULATORY_CARE_PROVIDER_SITE_OTHER): Admitting: Internal Medicine

## 2021-10-12 ENCOUNTER — Encounter: Payer: Self-pay | Admitting: Internal Medicine

## 2021-10-12 VITALS — HR 119 | Temp 101.4°F | Ht 62.0 in | Wt 150.0 lb

## 2021-10-12 DIAGNOSIS — U071 COVID-19: Secondary | ICD-10-CM

## 2021-10-12 MED ORDER — MOLNUPIRAVIR EUA 200MG CAPSULE
4.0000 | ORAL_CAPSULE | Freq: Two times a day (BID) | ORAL | 0 refills | Status: AC
Start: 2021-10-12 — End: 2021-10-17

## 2021-10-12 NOTE — Progress Notes (Signed)
Subjective:    Patient ID: Debra Norton, female    DOB: 07-Jun-1956, 65 y.o.   MRN: [id number]  DOS:  10/12/2021 Type of visit - description: Virtual Visit via Video Note  I connected with the above patient  by a video enabled telemedicine application and verified that I am speaking with the correct person using two identifiers.   THIS ENCOUNTER IS A VIRTUAL VISIT DUE TO COVID-19 - PATIENT WAS NOT SEEN IN THE OFFICE. PATIENT HAS CONSENTED TO VIRTUAL VISIT / TELEMEDICINE VISIT   Location of patient: home  Location of provider: office  Persons participating in the virtual visit: patient, provider   I discussed the limitations of evaluation and management by telemedicine and the availability of in person appointments. The patient expressed understanding and agreed to proceed.  Acute Last week, she has some sneezing, symptoms quickly resolved.  Symptoms started yesterday: Cough, sweats, increased sneezing. Develop a fever: 100.5 yesterday, 101.4 today. She tested positive for COVID today.  Denies chest pain or difficulty breathing No nausea, vomiting, diarrhea. No myalgias Had a headache, mild, not the worst of her life. Does not recall any COVID exposure recently.   Review of Systems See above   Past Medical History:  Diagnosis Date   Anxiety    Cervical dysplasia    Cholelithiasis    64mm stone seen on ultrasound in 2009   Colon polyp    Diverticulitis    Migraine headache    Vertigo     Past Surgical History:  Procedure Laterality Date   CHOLECYSTECTOMY     COLPOSCOPY     ECTOPIC PREGNANCY SURGERY     EXTERNAL EAR SURGERY Left    GYNECOLOGIC CRYOSURGERY      Allergies as of 10/12/2021       Reactions   Diphenhydramine Hcl    REACTION: VERY WIRED/palpitations  Benadryl   Flagyl [metronidazole] Nausea And Vomiting   Penicillins    REACTION: HIVES   Tetanus Toxoid    REACTION: REALLY BAD SWELLING   Clindamycin/lincomycin Rash   Rash shortly  after she finished a round of abx , see OV note 05-10-15   Hydrocodone    Insomnia, itching (mild)        Medication List        Accurate as of October 12, 2021 11:59 PM. If you have any questions, ask your nurse or doctor.          STOP taking these medications    atorvastatin 40 MG tablet Commonly known as: LIPITOR Stopped by: Kathlene November, MD   gabapentin 100 MG capsule Commonly known as: NEURONTIN Stopped by: Kathlene November, MD   PROBIOTIC PO Stopped by: Kathlene November, MD       TAKE these medications    ALPRAZolam 0.25 MG tablet Commonly known as: XANAX Take 1 tablet (0.25 mg total) by mouth at bedtime as needed for anxiety.   molnupiravir EUA 200 mg Caps capsule Commonly known as: LAGEVRIO Take 4 capsules (800 mg total) by mouth 2 (two) times daily for 5 days. Started by: Kathlene November, MD   Multimineral Plus Tabs Take 2 tablets by mouth daily.   VITA-C PO Take 1 tablet by mouth daily.           Objective:   Physical Exam Pulse (!) 119   Temp (!) 101.4 F (38.6 C) (Oral)   Ht 5\' 2"  (1.575 m)   Wt 150 lb (68 kg)   SpO2 98%   BMI 27.44  kg/m  This is a virtual video visit, alert oriented x3, in no distress, no cough noted, speaks in complete sentences    Assessment     Assessment Anxiety / insomnia: xanax at night, used to be rx by gyn, rx by PCP starting 01/2019 H/o dizziness: 2016, saw neurology, EEG was negative. H/o cervical dysplasia H/o OSA ( Not on CPAP, states the second sleep study did not show any improvement with CPAP use) Diverticulitis 2019 FH CAD: Father , younger sister MI in her 68s  PLAN: COVID-19: Symptoms started yesterday, does not look toxic. Benefits of medications discussed, prescribed  molnupiravir Also recommend fluids, rest, Tylenol, OTC cough suppressants. Call or seek medical attention if symptoms increase, O2 sat less than 94%. She will remain contagious for at least 5 to 7 days if she feels much better.  If not she will  remain contagious for 10 days. Has plans to see her mother, precautions discussed. Also rec a COVID booster in 1 month. RTC: Due for a visit, encouraged to return to the office at her earliest convenience    I discussed the assessment and treatment plan with the patient. The patient was provided an opportunity to ask questions and all were answered. The patient agreed with the plan and demonstrated an understanding of the instructions.   The patient was advised to call back or seek an in-person evaluation if the symptoms worsen or if the condition fails to improve as anticipated.

## 2021-10-13 NOTE — Assessment & Plan Note (Signed)
COVID-19: Symptoms started yesterday, does not look toxic. Benefits of medications discussed, prescribed  molnupiravir Also recommend fluids, rest, Tylenol, OTC cough suppressants. Call or seek medical attention if symptoms increase, O2 sat less than 94%. She will remain contagious for at least 5 to 7 days if she feels much better.  If not she will remain contagious for 10 days. Has plans to see her mother, precautions discussed. Also rec a COVID booster in 1 month. RTC: Due for a visit, encouraged to return to the office at her earliest convenience

## 2021-10-16 ENCOUNTER — Telehealth: Payer: Self-pay | Admitting: Internal Medicine

## 2021-10-16 NOTE — Telephone Encounter (Signed)
Pt. Called and stated she is still having some stuffy head and sore throat. She wants to know about how long does it take for symptoms to subside and also any advice for more comfort with those areas.

## 2021-10-17 ENCOUNTER — Encounter (HOSPITAL_BASED_OUTPATIENT_CLINIC_OR_DEPARTMENT_OTHER): Payer: Self-pay

## 2021-10-17 ENCOUNTER — Emergency Department (HOSPITAL_BASED_OUTPATIENT_CLINIC_OR_DEPARTMENT_OTHER)
Admission: EM | Admit: 2021-10-17 | Discharge: 2021-10-17 | Disposition: A | Discharge status: Home / Self Care | Attending: Emergency Medicine | Admitting: Emergency Medicine

## 2021-10-17 ENCOUNTER — Other Ambulatory Visit: Payer: Self-pay

## 2021-10-17 ENCOUNTER — Emergency Department (HOSPITAL_BASED_OUTPATIENT_CLINIC_OR_DEPARTMENT_OTHER)

## 2021-10-17 DIAGNOSIS — G4733 Obstructive sleep apnea (adult) (pediatric): Secondary | ICD-10-CM | POA: Diagnosis not present

## 2021-10-17 DIAGNOSIS — Z87891 Personal history of nicotine dependence: Secondary | ICD-10-CM | POA: Diagnosis not present

## 2021-10-17 DIAGNOSIS — U071 COVID-19: Secondary | ICD-10-CM | POA: Insufficient documentation

## 2021-10-17 DIAGNOSIS — Z79899 Other long term (current) drug therapy: Secondary | ICD-10-CM | POA: Insufficient documentation

## 2021-10-17 LAB — COMPREHENSIVE METABOLIC PANEL
ALT: 68 U/L — ABNORMAL HIGH (ref 0–44)
AST: 41 U/L (ref 15–41)
Albumin: 4.4 g/dL (ref 3.5–5.0)
Alkaline Phosphatase: 118 U/L (ref 38–126)
Anion gap: 9 (ref 5–15)
BUN: 16 mg/dL (ref 8–23)
CO2: 27 mmol/L (ref 22–32)
Calcium: 9.3 mg/dL (ref 8.9–10.3)
Chloride: 104 mmol/L (ref 98–111)
Creatinine, Ser: 0.74 mg/dL (ref 0.44–1.00)
GFR, Estimated: >60 mL/min (ref >60)
Glucose, Bld: 85 mg/dL (ref 70–99)
Potassium: 4.3 mmol/L (ref 3.5–5.1)
Sodium: 140 mmol/L (ref 135–145)
Total Bilirubin: 0.5 mg/dL (ref 0.3–1.2)
Total Protein: 8 g/dL (ref 6.5–8.1)

## 2021-10-17 LAB — CBC WITH DIFFERENTIAL/PLATELET
Abs Immature Granulocytes: 0.01 10*3/uL (ref 0.00–0.07)
Basophils Absolute: 0 10*3/uL (ref 0.0–0.1)
Basophils Relative: 0 %
Eosinophils Absolute: 0.1 10*3/uL (ref 0.0–0.5)
Eosinophils Relative: 2 %
HCT: 43.6 % (ref 36.0–46.0)
Hemoglobin: 14.5 g/dL (ref 12.0–15.0)
Immature Granulocytes: 0 %
Lymphocytes Relative: 42 %
Lymphs Abs: 2.7 10*3/uL (ref 0.7–4.0)
MCH: 30.2 pg (ref 26.0–34.0)
MCHC: 33.3 g/dL (ref 30.0–36.0)
MCV: 90.8 fL (ref 80.0–100.0)
Monocytes Absolute: 0.4 10*3/uL (ref 0.1–1.0)
Monocytes Relative: 6 %
Neutro Abs: 3.3 10*3/uL (ref 1.7–7.7)
Neutrophils Relative %: 50 %
Platelets: 294 10*3/uL (ref 150–400)
RBC: 4.8 MIL/uL (ref 3.87–5.11)
RDW: 12.8 % (ref 11.5–15.5)
WBC: 6.6 10*3/uL (ref 4.0–10.5)
nRBC: 0 % (ref 0.0–0.2)

## 2021-10-17 MED ORDER — METHYLPREDNISOLONE SODIUM SUCC 125 MG IJ SOLR
125.0000 mg | Freq: Once | INTRAMUSCULAR | Status: AC
  Administered 2021-10-17: 125 mg via INTRAVENOUS
  Filled 2021-10-17: qty 2

## 2021-10-17 MED ORDER — PREDNISONE 20 MG PO TABS: 40.0000 mg | ORAL_TABLET | Freq: Every day | ORAL | 0 refills | Status: AC

## 2021-10-17 NOTE — Telephone Encounter (Signed)
Patient is calling back regarding her message from yesterday, she states she has taken all of the medication given to her on 10/28 for covid but she is still not feeling good. She is scared she is getting bronchitis and wants to know what to do. Please advice.

## 2021-10-17 NOTE — Discharge Instructions (Addendum)
Your blood work and chest x-ray were all reassuring.  There is no sign of pneumonia.  I will send in prednisone to your pharmacy.  Since she got a shot of steroids in the emergency room I would wait until tomorrow to start prednisone course. If you develop significant shortness of breath please return to the emergency department.

## 2021-10-17 NOTE — Telephone Encounter (Signed)
Patient currently in ER for evaluation/treatment.

## 2021-10-17 NOTE — ED Triage Notes (Signed)
Pt states she had +covid test 10/28-c/o URI sx and SOB-NAD-steady gait

## 2021-10-17 NOTE — Telephone Encounter (Signed)
Called pt tried to schedule an appt with someone in office , we are booked . Advised pt on going to the ER stated she didn't want to really go but she will go .

## 2021-10-17 NOTE — Telephone Encounter (Signed)
Spoke with the patient, she was diagnosed with COVID 10/12/2021, finishing Molnupiravir She is calling today because she is more short of breath. Still coughing, no sputum. No fever. O2 sat 96-100% Heart rate ~100 Plan: Arrange office visit today to be seen in person. If no appointment available patient know she will be directed to the emergency room.

## 2021-10-17 NOTE — ED Provider Notes (Signed)
[facility] DEPART[facility] Provider Note   CSN: [id number] Arrival date & time: 10/17/21  1114     History Chief Complaint  Patient presents with   Covid Positive    Debra Norton is a 65 y.o. female.  65 year old female presents today for evaluation of worsening URI symptoms in setting of diagnosed COVID on 10/28.  Patient reports she was seen by her PCP provider after developing symptoms last Thursday where she was diagnosed with COVID.  She reports initially following her diagnosis her symptoms improved, but over the last couple days she developed sore throat, sinus congestion, fever, and cough.  She denies shortness of breath, but does endorse fatigue.  She otherwise denies chest pain, hemoptysis, abdominal pain, nausea, inability to tolerate p.o. intake.  The history is provided by the patient. No language interpreter was used.      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   PCP NOTES >>>>>>>>>>>>> 07/30/2017   Annual physical exam 06/28/2015   Allergic reaction 05/11/2015   Dizziness and giddiness 05/24/2014   OSA (obstructive sleep apnea) 05/24/2014   Cervical dysplasia    GERD 07/05/2010   COLONIC POLYPS, ADENOMATOUS, HX OF 07/05/2010   MIGRAINE HEADACHE 05/08/2007    Past Surgical History:  Procedure Laterality Date   CHOLECYSTECTOMY     COLPOSCOPY     ECTOPIC PREGNANCY SURGERY     EXTERNAL EAR SURGERY Left    GYNECOLOGIC CRYOSURGERY       OB History     Gravida  3   Para  0   Term      Preterm      AB   3   Living  0      SAB      IAB      Ectopic  1   Multiple      Live Births           Obstetric Comments  3 miscarriages 1 ectopic         Family History  Problem Relation Age of Onset   Colon cancer Mother        in her 67s   Diabetes Sister    Ovarian cancer Cousin        Mat. 1st cousin   CAD Father        38   Breast cancer Neg Hx    Esophageal cancer Neg Hx    Pancreatic cancer Neg Hx     Social History   Tobacco Use   Smoking status: Former   Smokeless tobacco: Never   Tobacco comments:    quit in high School  Vaping Use   Vaping Use: Never used  Substance Use Topics   Alcohol use: Yes    Alcohol/week: 2.0 standard drinks    Types: 2 Standard drinks or equivalent per week    Comment: weekly   Drug use: No    Home Medications Prior to Admission medications   Medication Sig Start Date End Date  Taking? Authorizing Provider  ALPRAZolam (XANAX) 0.25 MG tablet Take 1 tablet (0.25 mg total) by mouth at bedtime as needed for anxiety. 01/03/21   Tamela Gammon, NP  Ascorbic Acid (VITA-C PO) Take 1 tablet by mouth daily.    [provider]  molnupiravir EUA (LAGEVRIO) 200 mg CAPS capsule Take 4 capsules (800 mg total) by mouth 2 (two) times daily for 5 days. 10/12/21 10/17/21  Colon Branch, MD  Multiple Vitamins-Minerals (MULTIMINERAL PLUS) TABS Take 2 tablets by mouth daily.    [provider]    Allergies    Diphenhydramine hcl, Flagyl [metronidazole], Penicillins, Tetanus toxoid, Clindamycin/lincomycin, and Hydrocodone  Review of Systems   Review of Systems  Constitutional:  Positive for fever. Negative for activity change and appetite change.  HENT:  Positive for congestion and sore throat. Negative for trouble swallowing.   Respiratory:  Positive for cough. Negative for chest tightness and shortness of breath.   Cardiovascular:  Negative for chest pain, palpitations and leg swelling.  Gastrointestinal:  Negative for  abdominal pain, diarrhea, nausea and vomiting.  Musculoskeletal:  Negative for myalgias.  Neurological:  Negative for syncope, light-headedness and headaches.  All other systems reviewed and are negative.  Physical Exam Updated Vital Signs BP (!) 144/78   Pulse 72   Temp 98.7 F (37.1 C) (Oral)   Resp 16   Ht 5\' 2"  (1.575 m)   Wt 70.3 kg   SpO2 99%   BMI 28.35 kg/m   Physical Exam Vitals and nursing note reviewed.  Constitutional:      General: She is not in acute distress.    Appearance: Normal appearance. She is not ill-appearing.  HENT:     Head: Normocephalic and atraumatic.     Nose: Nose normal.  Eyes:     General: No scleral icterus.    Extraocular Movements: Extraocular movements intact.     Conjunctiva/sclera: Conjunctivae normal.  Cardiovascular:     Rate and Rhythm: Normal rate and regular rhythm.     Pulses: Normal pulses.     Heart sounds: Normal heart sounds.  Pulmonary:     Effort: Pulmonary effort is normal. No respiratory distress.     Breath sounds: Normal breath sounds. No wheezing or rales.  Abdominal:     General: There is no distension.     Palpations: Abdomen is soft.     Tenderness: There is no abdominal tenderness.  Musculoskeletal:        General: Normal range of motion.     Cervical back: Normal range of motion.     Right lower leg: No edema.     Left lower leg: No edema.  Skin:    General: Skin is warm and dry.  Neurological:     General: No focal deficit present.     Mental Status: She is alert. Mental status is at baseline.    ED Results / Procedures / Treatments   Labs (all labs ordered are listed, but only abnormal results are displayed) Labs Reviewed  CBC WITH DIFFERENTIAL/PLATELET  COMPREHENSIVE METABOLIC PANEL    EKG None  Radiology DG Chest Port 1 View  Result Date: 10/17/2021 CLINICAL DATA:  Shortness of breath. EXAM: PORTABLE CHEST 1 VIEW COMPARISON:  None. FINDINGS: The heart size and mediastinal contours are  within normal limits. Both lungs are clear. The visualized skeletal structures are unremarkable. IMPRESSION: No active disease. Electronically Signed   By: Virgina Norfolk M.D.   On: 10/17/2021 15:32  Procedures Procedures   Medications Ordered in ED Medications  methylPREDNISolone sodium succinate (SOLU-MEDROL) 125 mg/2 mL injection 125 mg (has no administration in time range)    ED Course  I have reviewed the triage vital signs and the nursing notes.  Pertinent labs & imaging results that were available during my care of the patient were reviewed by me and considered in my medical decision making (see chart for details).    MDM Rules/Calculators/A&P                           65 year old female presents to day for evaluation of  worsening URI symptoms following covid diagnosis 10/28. Her CXR is negative for pneumonia. CMP and CBC are unremarkable. Solumedrol 12 given in ED and discharged with 40mg  prednisone x4 days. Patient appropriate for discharge. Return precautions discussed. Patient voices understanding and is in agreement with the plan.   Final Clinical Impression(s) / ED Diagnoses Final diagnoses:  None    Rx / DC Orders ED Discharge Orders     None        Evlyn Courier, PA-C 10/17/21 1700    Drenda Freeze, MD 10/17/21 2328

## 2021-10-19 ENCOUNTER — Encounter: Payer: Self-pay | Admitting: Internal Medicine

## 2021-10-31 ENCOUNTER — Ambulatory Visit (INDEPENDENT_AMBULATORY_CARE_PROVIDER_SITE_OTHER): Admitting: Psychology

## 2021-10-31 DIAGNOSIS — F4321 Adjustment disorder with depressed mood: Secondary | ICD-10-CM | POA: Diagnosis not present

## 2021-12-26 ENCOUNTER — Ambulatory Visit (INDEPENDENT_AMBULATORY_CARE_PROVIDER_SITE_OTHER): Admitting: Psychology

## 2021-12-26 DIAGNOSIS — F4321 Adjustment disorder with depressed mood: Secondary | ICD-10-CM | POA: Diagnosis not present

## 2021-12-26 NOTE — Progress Notes (Signed)
[facility] Counselor/Therapist Progress Note  Patient ID: Debra Norton, MRN: [id number],    Date: 12/26/2021  Time Spent: 3:00pm-3:45pm   45 minutes   Treatment Type: Individual Therapy  Reported Symptoms: sadness, tearfulness  Mental Status Exam: Appearance:  Casual     Behavior: Appropriate  Motor: Normal  Speech/Language:  Normal Rate  Affect: Appropriate  Mood: normal  Thought process: normal  Thought content:   WNL  Sensory/Perceptual disturbances:   WNL  Orientation: oriented to person, place, time/date, and situation  Attention: Good  Concentration: Good  Memory: WNL  Fund of knowledge:  Good  Insight:   Good  Judgment:  Good  Impulse Control: Good   Risk Assessment: Danger to Self:  No Self-injurious Behavior: No Danger to Others: No Duty to Warn:no Physical Aggression / Violence:No  Access to Firearms a concern: No  Gang Involvement:No   Subjective:  Pt present for face-to-face individual therapy via Webex.  Pt consents to telehealth video session due to COVID 19 pandemic. Location of pt: home. Location of therapist: home office.   Pt talked about the holidays.  She was in [place] with family.   It was good for her to be with family.   Pt is having a tough day bc it is her deceased fiance's birthday.   She misses him.  Addressed pt's grief.  Pt talked about the things she misses about her fiance.  Helped pt process her feelings and grief.   Identified healthy distractions and coping strategies that pt can engage in to help her not think so much about the loss of her fiance and nephew.     Provided supportive therapy.     Interventions: Cognitive Behavioral Therapy and Insight-Oriented  Diagnosis: F43.21 Adjustment disorder with depressed mood  Plan: See pt's Treatment Plan for depression in Therapy Charts.  (Treatment Plan Target Date: 07/03/2022) Pt is progressing toward treatment goals.   Plan to continue to see pt monthly.     [name], LCSW

## 2022-01-09 ENCOUNTER — Encounter: Payer: Self-pay | Admitting: Family Medicine

## 2022-01-09 ENCOUNTER — Ambulatory Visit (INDEPENDENT_AMBULATORY_CARE_PROVIDER_SITE_OTHER): Admitting: Family Medicine

## 2022-01-09 VITALS — BP 108/70 | HR 121 | Temp 100.2°F | Resp 18 | Ht 62.0 in | Wt 156.6 lb

## 2022-01-09 DIAGNOSIS — J069 Acute upper respiratory infection, unspecified: Secondary | ICD-10-CM | POA: Diagnosis not present

## 2022-01-09 DIAGNOSIS — R051 Acute cough: Secondary | ICD-10-CM | POA: Diagnosis not present

## 2022-01-09 LAB — POC INFLUENZA A&B (BINAX/QUICKVUE)
Influenza A, POC: NEGATIVE
Influenza B, POC: NEGATIVE

## 2022-01-09 MED ORDER — ALBUTEROL SULFATE HFA 108 (90 BASE) MCG/ACT IN AERS: 2.0000 | INHALATION_SPRAY | Freq: Four times a day (QID) | RESPIRATORY_TRACT | 11 refills | Status: DC | PRN

## 2022-01-09 MED ORDER — BENZONATATE 200 MG PO CAPS: 200.0000 mg | ORAL_CAPSULE | Freq: Two times a day (BID) | ORAL | 0 refills | Status: DC | PRN

## 2022-01-09 MED ORDER — FLUTICASONE PROPIONATE 50 MCG/ACT NA SUSP: 2.0000 | Freq: Every day | NASAL | 0 refills | Status: DC

## 2022-01-09 NOTE — Patient Instructions (Addendum)
Continue supportive measures including rest, hydration, humidifier use, steam showers, warm compresses to sinuses, warm liquids with lemon and honey, and over-the-counter cough, cold, and analgesics as needed.    Over the counter medications that may be helpful for symptoms:  Guaifenesin 1200 mg extended release tabs twice daily, with plenty of water For cough and congestion Brand name: Mucinex   Pseudoephedrine 30 mg, one or two tabs every 4 to 6 hours For sinus congestion Brand name: Sudafed You must get this from the pharmacy counter.  Oxymetazoline nasal spray each morning, one spray in each nostril, for NO MORE THAN 3 days  For nasal and sinus congestion Brand name: Afrin Saline nasal spray or Saline Nasal Irrigation 3-5 times a day For nasal and sinus congestion Brand names: Ocean or AYR Fluticasone nasal spray, one spray in each nostril, each morning (after oxymetazoline and saline, if used) For nasal and sinus congestion Brand name: Flonase Warm salt water gargles  For sore throat Every few hours as needed Alternate ibuprofen 400-600 mg and acetaminophen 1000 mg every 4-6 hours For fever, body aches, headache Brand names: Motrin or Advil and Tylenol Dextromethorphan 12-hour cough version 30 mg every 12 hours  For cough Brand name: Delsym Stop all other cold medications for now (Nyquil, Dayquil, Tylenol Cold, Theraflu, etc) and other non-prescription cough/cold preparations. Many of these have the same ingredients listed above and could cause an overdose of medication.   Herbal treatments that have been shown to be helpful in some patients include: Vitamin C 1000mg  per day Vitamin D 4000iU per day Zinc 100mg  per day Quercetin 25-500mg  twice a day Melatonin 5-10mg  at bedtime  General Instructions Allow your body to rest Drink PLENTY of fluids Isolate yourself from everyone, even family, until test results have returned   Once you have recovered, please continue good  preventive care measures, including:  wearing a mask when in public wash your hands frequently avoid touching your face/nose/eyes cover coughs/sneezes with the inside of your elbow stay out of crowds keep a 6 foot distance from others  If you develop severe shortness of breath, uncontrolled fevers, coughing up blood, confusion, chest pain, or signs of dehydration (such as significantly decreased urine amounts or dizziness with standing) please go to the ER.

## 2022-01-09 NOTE — Progress Notes (Signed)
Acute Office Visit  Subjective:    Patient ID: Debra Norton, female    DOB: [birth year], 66 y.o.   MRN: [id number]  Chief Complaint  Patient presents with   Cough    Pt states sxs started 01/21 and states having cough, runny nose, headache, and dizziness. Pt states testing for COVID yesterday and it was negative. Pt states trying OTC meds w/o any relief.    HPI Patient is in today for URI symptoms.  Patient states on Saturday, about 4 days ago, she started feeling poorly. Symptoms include sore throat, sinus pressure, productive cough, headaches, left ear ache, fatigue, body aches. She denies chest pain, dyspnea, wheezing, GI/GU symptoms. She had a negative home COVID test yesterday. No known sick contacts. So far she has been trying Mucinex, Dayquil, Advil, Vitamin C, elderberry.      Past Medical History:  Diagnosis Date   Anxiety    Cervical dysplasia    Cholelithiasis    58mm stone seen on ultrasound in 2009   Colon polyp    Diverticulitis    Migraine headache    Vertigo     Past Surgical History:  Procedure Laterality Date   CHOLECYSTECTOMY     COLPOSCOPY     ECTOPIC PREGNANCY SURGERY     EXTERNAL EAR SURGERY Left    GYNECOLOGIC CRYOSURGERY      Family History  Problem Relation Age of Onset   Colon cancer Mother        in her 10s   Diabetes Sister    Ovarian cancer Cousin        Mat. 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  Tobacco Use   Smoking status: Former   Smokeless tobacco: Never   Tobacco comments:    quit in high School  Vaping Use   Vaping Use: Never used  Substance and Sexual Activity   Alcohol use: Yes     Alcohol/week: 2.0 standard drinks    Types: 2 Standard drinks or equivalent per week    Comment: weekly   Drug use: No   Sexual activity: Yes    Comment: INTERCOURSE AGE 31, SEXUAL PARTNERS  MORE THAN 5  Other Topics Concern   Not on file  Social History Narrative   Lives with boy friend   G4 P0    miscarriage x 3    ectopic x 1    Social Determinants of Health   Financial Resource Strain: Not on file  Food Insecurity: Not on file  Transportation Needs: Not on file  Physical Activity: Not on file  Stress: Not on file  Social Connections: Not on file  Intimate Partner Violence: Not on file    Outpatient Medications Prior to Visit  Medication Sig Dispense Refill   ALPRAZolam (XANAX) 0.25 MG tablet Take 1 tablet (0.25 mg total) by mouth at bedtime as needed for anxiety. 30 tablet 0   Ascorbic Acid (VITA-C PO) Take 1 tablet by mouth daily.     Multiple Vitamins-Minerals (MULTIMINERAL PLUS) TABS Take  2 tablets by mouth daily.     No facility-administered medications prior to visit.    Allergies  Allergen Reactions   Diphenhydramine Hcl     REACTION: VERY WIRED/palpitations  Benadryl   Flagyl [Metronidazole] Nausea And Vomiting   Penicillins     REACTION: HIVES   Tetanus Toxoid     REACTION: REALLY BAD SWELLING   Clindamycin/Lincomycin Rash    Rash shortly after she finished a round of abx , see OV note 05-10-15   Hydrocodone     Insomnia, itching (mild)    Review of Systems All review of systems negative except what is listed in the HPI     Objective:    Physical Exam Vitals reviewed.  Constitutional:      Appearance: Normal appearance.  HENT:     Head: Normocephalic and atraumatic.     Right Ear: Tympanic membrane normal.     Left Ear: Tympanic membrane normal.     Nose: Congestion present.     Mouth/Throat:     Mouth: Mucous membranes are moist.     Pharynx: Oropharynx is clear. No oropharyngeal exudate or posterior oropharyngeal erythema.  Eyes:      Extraocular Movements: Extraocular movements intact.     Conjunctiva/sclera: Conjunctivae normal.  Cardiovascular:     Rate and Rhythm: Normal rate and regular rhythm.     Pulses: Normal pulses.  Pulmonary:     Effort: Pulmonary effort is normal.     Breath sounds: Normal breath sounds.  Musculoskeletal:     Cervical back: Normal range of motion and neck supple. No tenderness.  Lymphadenopathy:     Cervical: No cervical adenopathy.  Skin:    General: Skin is warm and dry.     Capillary Refill: Capillary refill takes less than 2 seconds.  Neurological:     General: No focal deficit present.     Mental Status: She is alert and oriented to person, place, and time. Mental status is at baseline.  Psychiatric:        Mood and Affect: Mood normal.        Behavior: Behavior normal.        Thought Content: Thought content normal.        Judgment: Judgment normal.    BP 108/70 (BP Location: Left Arm, Patient Position: Sitting, Cuff Size: Large)    Pulse (!) 121    Temp 100.2 F (37.9 C) (Oral)    Resp 18    Ht 5\' 2"  (1.575 m)    Wt 156 lb 9.6 oz (71 kg)    SpO2 96%    BMI 28.64 kg/m  Wt Readings from Last 3 Encounters:  01/09/22 156 lb 9.6 oz (71 kg)  10/17/21 155 lb (70.3 kg)  10/12/21 150 lb (68 kg)    Health Maintenance Due  Topic Date Due   HIV Screening  Never done   Zoster Vaccines- Shingrix (1 of 2) Never done   INFLUENZA VACCINE  Never done   COVID-19 Vaccine (5 - Booster for Pfizer series) 07/18/2021    There are no preventive care reminders to display for this patient.   Lab Results  Component Value Date   TSH 5.17 (H) 04/06/2020   Lab Results  Component Value Date   WBC 6.6 10/17/2021   HGB 14.5 10/17/2021   HCT 43.6 10/17/2021   MCV 90.8 10/17/2021   PLT 294 10/17/2021   Lab Results  Component Value Date   NA 140 10/17/2021   K  4.3 10/17/2021   CO2 27 10/17/2021   GLUCOSE 85 10/17/2021   BUN 16 10/17/2021   CREATININE 0.74 10/17/2021   BILITOT 0.5  10/17/2021   ALKPHOS 118 10/17/2021   AST 41 10/17/2021   ALT 68 (H) 10/17/2021   PROT 8.0 10/17/2021   ALBUMIN 4.4 10/17/2021   CALCIUM 9.3 10/17/2021   ANIONGAP 9 10/17/2021   GFR 74.71 03/31/2020   Lab Results  Component Value Date   CHOL 133 05/18/2020   Lab Results  Component Value Date   HDL 58.80 05/18/2020   Lab Results  Component Value Date   LDLCALC 62 05/18/2020   Lab Results  Component Value Date   TRIG 63.0 05/18/2020   Lab Results  Component Value Date   CHOLHDL 2 05/18/2020   No results found for: HGBA1C     Assessment & Plan:   1. Acute cough 2. Viral URI Flu negative.  Adding Flonase, benzonatate, albuterol inhaler for coughing fits. Continue supportive measures including rest, hydration, humidifier use, steam showers, warm compresses to sinuses, warm liquids with lemon and honey, and over-the-counter cough, cold, and analgesics as needed.  Patient aware of signs/symptoms requiring further/urgent evaluation. If not improving by day 10 of symptoms, consider ABX.   - POC Influenza A&B (Binax test) - fluticasone (FLONASE) 50 MCG/ACT nasal spray; Place 2 sprays into both nostrils daily.  Dispense: 1 g; Refill: 0 - benzonatate (TESSALON) 200 MG capsule; Take 1 capsule (200 mg total) by mouth 2 (two) times daily as needed for cough.  Dispense: 20 capsule; Refill: 0 - albuterol (VENTOLIN HFA) 108 (90 Base) MCG/ACT inhaler; Inhale 2 puffs into the lungs every 6 (six) hours as needed for wheezing.  Dispense: 2 each; Refill: 11   Follow-up if symptoms worsen or fail to improve.    Terrilyn Saver, NP

## 2022-01-14 ENCOUNTER — Telehealth: Payer: Self-pay

## 2022-01-14 NOTE — Telephone Encounter (Signed)
Nurse Assessment Nurse: Thad Ranger RN, Langley Gauss Date/Time (Eastern Time): 01/14/2022 12:31:53 PM Confirm and document reason for call. If symptomatic, describe symptoms. ---She has a cough, stuffy head and headache. No fever but has cough and runny nose. Does the patient have any new or worsening symptoms? ---Yes Will a triage be completed? ---Yes Related visit to physician within the last 2 weeks? ---Yes Does the PT have any chronic conditions? (i.e. diabetes, asthma, this includes High risk factors for pregnancy, etc.) ---No Is this a behavioral health or substance abuse call? ---No Guidelines Guideline Title Affirmed Question Affirmed Notes Nurse Date/Time Eilene Ghazi Time) Common Cold Earache Carmon, RN, Langley Gauss 01/14/2022 12:32:54 PM Disp. Time Eilene Ghazi Time) Disposition Final User 01/14/2022 12:34:54 PM See PCP within 24 Hours Yes Carmon, RN, Yevette Edwards Disagree/Comply Comply Caller Understands Yes PreDisposition Call Doctor PLEASE NOTE: All timestamps contained within this report are represented as [place] Standard Time. CONFIDENTIALTY NOTICE: This fax transmission is intended only for the addressee. It contains information that is legally privileged, confidential or otherwise protected from use or disclosure. If you are not the intended recipient, you are strictly prohibited from reviewing, disclosing, copying using or disseminating any of this information or taking any action in reliance on or regarding this information. If you have received this fax in error, please notify us immediately by telephone so that we can arrange for its return to [place]. Phone: [phone], Toll-Free: [phone], Fax: [phone] Page: 2 of 2 Call Id: [id number] Care Advice Given Per Guideline SEE PCP WITHIN 24 HOURS: PAIN MEDICINES: * IBUPROFEN (E.G., MOTRIN, ADVIL): Take 400 mg (two 200 mg pills) by mouth every 6 hours. The most you should take is 6 pills a day (1,200 mg total). FOR A RUNNY NOSE - BLOW  YOUR NOSE: * Nasal mucus and discharge help wash viruses and bacteria out of the nose and sinuses. NASAL WASHES FOR A STUFFY NOSE: * Introduction: Saline (salt water) nasal irrigation (nasal wash) is an effective and simple home remedy for treating stuffy nose and sinus congestion. The nose can be irrigated by pouring, spraying, or squirting salt water into the nose and then letting it run back out. * STEP 4: Blow your nose to clean out the water and mucus. HUMIDIFIER: * If the air is dry, use a humidifier in the bedroom. CALL BACK IF: * You become worse CARE ADVICE given per Common Cold (Adult) guideline. Referrals REFERRED TO PCP OFFICE  Pt scheduled w/ Lovena Le tomorrow

## 2022-01-15 ENCOUNTER — Encounter: Payer: Self-pay | Admitting: Family Medicine

## 2022-01-15 ENCOUNTER — Telehealth (INDEPENDENT_AMBULATORY_CARE_PROVIDER_SITE_OTHER): Admitting: Family Medicine

## 2022-01-15 VITALS — HR 90

## 2022-01-15 DIAGNOSIS — J0110 Acute frontal sinusitis, unspecified: Secondary | ICD-10-CM | POA: Diagnosis not present

## 2022-01-15 MED ORDER — AZITHROMYCIN 250 MG PO TABS: ORAL_TABLET | ORAL | 0 refills | Status: DC

## 2022-01-15 NOTE — Progress Notes (Signed)
Virtual Video Visit via MyChart Note  I connected with  Debra Norton on 01/15/22 at  8:20 AM EST by the video enabled telemedicine application for MyChart, and verified that I am speaking with the correct person using two identifiers.   I introduced myself as a Designer, jewellery with the practice. We discussed the limitations of evaluation and management by telemedicine and the availability of in person appointments. The patient expressed understanding and agreed to proceed.  Participating parties in this visit include: The patient and the nurse practitioner listed.  The patient is: At home I am: In the office - [facility]  Subjective:    CC:  Chief Complaint  Patient presents with   Sinusitis    HPI: Debra Norton is a 66 y.o. year old female presenting today via [place] today for ongoing URI symptoms.   Patient reports she is now on day 10 of symptoms with only some mild improvement. She is still having sinus pressure/headaches and cough (occasionally productive with yellow/green sputum). She has gotten some relief with the inhaler for nighttime cough and wheezing. She tried to work yesterday, but was sent home early because she sounded rough. She denies any chest pain, dyspnea, nausea, vomiting, diarrhea, fevers.       Past medical history, Surgical history, Family history not pertinant except as noted below, Social history, Allergies, and medications have been entered into the medical record, reviewed, and corrections made.   Review of Systems:  All review of systems negative except what is listed in the HPI   Objective:    General:  Speaking clearly in complete sentences. Absent shortness of breath noted.   Alert and oriented x3.   Normal judgment.  Absent acute distress.   Impression and Recommendations:    1. Acute non-recurrent frontal sinusitis - azithromycin (ZITHROMAX Z-PAK) 250 MG tablet; Take 2 tablets (500  mg) on  Day 1,  followed by 1 tablet (250 mg) once daily on Days 2 through 5.  Dispense: 6 tablet; Refill: 0  Given duration, starting a zpak. Continue supportive measures including rest, hydration, humidifier use, steam showers, warm compresses to sinuses, warm liquids with lemon and honey, and over-the-counter cough, cold, and analgesics as needed. Patient aware of signs/symptoms requiring further/urgent evaluation.     Follow-up if symptoms worsen or fail to improve.    I discussed the assessment and treatment plan with the patient. The patient was provided an opportunity to ask questions and all were answered. The patient agreed with the plan and demonstrated an understanding of the instructions.   The patient was advised to call back or seek an in-person evaluation if the symptoms worsen or if the condition fails to improve as anticipated.  I spent 20 minutes dedicated to the care of this patient on the date of this encounter to include pre-visit chart review of prior notes and results, face-to-face time with the patient, and post-visit ordering of testing as indicated.   Terrilyn Saver, NP

## 2022-01-16 ENCOUNTER — Encounter: Payer: Self-pay | Admitting: Internal Medicine

## 2022-01-19 ENCOUNTER — Other Ambulatory Visit: Payer: Self-pay | Admitting: Family Medicine

## 2022-01-19 DIAGNOSIS — R051 Acute cough: Secondary | ICD-10-CM

## 2022-01-19 DIAGNOSIS — J069 Acute upper respiratory infection, unspecified: Secondary | ICD-10-CM

## 2022-01-21 ENCOUNTER — Telehealth: Payer: Self-pay | Admitting: Internal Medicine

## 2022-01-21 NOTE — Telephone Encounter (Signed)
Is refill appropriate? Pt scheduled for a virtual visit with you tomorrow morning at 8:20.

## 2022-01-21 NOTE — Telephone Encounter (Signed)
Patient stated she is still experiencing flu like symps from prevs visits. Patient would like for someone to contact her with suggestions.

## 2022-01-21 NOTE — Telephone Encounter (Signed)
Pt scheduled for 8:20 virtual tomorrow morning.

## 2022-01-21 NOTE — Telephone Encounter (Signed)
Pt has been seen by Lovena Le the last 2 times.

## 2022-01-22 ENCOUNTER — Telehealth (INDEPENDENT_AMBULATORY_CARE_PROVIDER_SITE_OTHER): Admitting: Family Medicine

## 2022-01-22 ENCOUNTER — Encounter: Payer: Self-pay | Admitting: Family Medicine

## 2022-01-22 DIAGNOSIS — J0110 Acute frontal sinusitis, unspecified: Secondary | ICD-10-CM | POA: Diagnosis not present

## 2022-01-22 MED ORDER — DOXYCYCLINE HYCLATE 100 MG PO TABS: 100.0000 mg | ORAL_TABLET | Freq: Two times a day (BID) | ORAL | 0 refills | Status: AC

## 2022-01-22 MED ORDER — PREDNISONE 20 MG PO TABS: 40.0000 mg | ORAL_TABLET | Freq: Every day | ORAL | 0 refills | Status: AC

## 2022-01-22 NOTE — Progress Notes (Signed)
Virtual Video Visit via MyChart Note  I connected with  Debra Norton on 01/22/22 at  8:20 AM EST by the video enabled telemedicine application for MyChart, and verified that I am speaking with the correct person using two identifiers.   I introduced myself as a Designer, jewellery with the practice. We discussed the limitations of evaluation and management by telemedicine and the availability of in person appointments. The patient expressed understanding and agreed to proceed.  Participating parties in this visit include: The patient and the nurse practitioner listed.  The patient is: At home I am: In the office - [facility]  Subjective:    CC: sinusitis   HPI: Debra Norton is a 66 y.o. year old female presenting today via MyChart today for sinusitis.  01/09/22 Office visit for 4 days of URI symptoms. COVID negative at home. Flu negative. Started supportive measures + Flonase, benzonatate, albuterol inhaler.  01/15/22 Virtual visit for ongoing symptoms. Started Zpak.   Today she reports mild improvement (finished Zpak on Saturday). Still has congestion, cough with yellow sputum, fatigue, and sinus pressure.  No fevers, chest pain, dyspnea; no new symptoms. She feels like she just can't quite knock this out all the way. Now on day ~18 of illness.      Past medical history, Surgical history, Family history not pertinant except as noted below, Social history, Allergies, and medications have been entered into the medical record, reviewed, and corrections made.   Review of Systems:  All review of systems negative except what is listed in the HPI   Objective:    General:  Speaking clearly in complete sentences. Absent shortness of breath noted.   Alert and oriented x3.   Normal judgment.  Absent acute distress.   Impression and Recommendations:    1. Acute non-recurrent frontal sinusitis - doxycycline (VIBRA-TABS) 100 MG tablet;  Take 1 tablet (100 mg total) by mouth 2 (two) times daily for 7 days.  Dispense: 14 tablet; Refill: 0 - predniSONE (DELTASONE) 20 MG tablet; Take 2 tablets (40 mg total) by mouth daily with breakfast for 5 days.  Dispense: 10 tablet; Refill: 0  Patient did not get a full response from [place]. Will try doxycycline and adding some prednisone. Continue supportive measures including rest, hydration, humidifier use, steam showers, warm compresses to sinuses, warm liquids with lemon and honey, and over-the-counter cough, cold, and analgesics as needed. Patient aware of signs/symptoms requiring further/urgent evaluation.    Follow-up if symptoms worsen or fail to improve.    I discussed the assessment and treatment plan with the patient. The patient was provided an opportunity to ask questions and all were answered. The patient agreed with the plan and demonstrated an understanding of the instructions.   The patient was advised to call back or seek an in-person evaluation if the symptoms worsen or if the condition fails to improve as anticipated.  I spent 20 minutes dedicated to the care of this patient on the date of this encounter to include pre-visit chart review of prior notes and results, face-to-face time with the patient, and post-visit ordering of testing as indicated.   Terrilyn Saver, NP

## 2022-02-26 NOTE — Progress Notes (Signed)
? ?Shacoria Latif Forte-Leroux 1956-11-29 [id number] ? ? ?History:  66 y.o. G3P0030 presents for annual exam. Postmenopausal - no HRT, no bleeding. Cryosurgery years ago >20 years ago, normal since. Complaints of worsening fatigue over the last few months. She returns home from work and does not have the energy to do anything other than eat. She works as Optometrist. Does not exercise. Has poor sleep, always has. ? ?Gynecologic History ?No LMP recorded. Patient is postmenopausal. ?  ?Contraception/Family planning: post menopausal status ?Sexually active: No ? ?Health Maintenance ?Last Pap: 01/03/2021. Results were: Normal cytology positive HR HPV neg 16/18/45 ?Last mammogram: 07/10/2021. Results were: normal ?Last colonoscopy: 11/06/2018. Results were: Tubular adenomas, sessile serrated polyps, 5-year recall ?Last Dexa: 02/20/2021. Results were: Normal ? ?Past medical history, past surgical history, family history and social history were all reviewed and documented in the EPIC chart. Works as Optometrist at [employer]. Mother diagnosed with colon cancer in her 63s.  ? ?ROS:  A ROS was performed and pertinent positives and negatives are included. ? ?Exam: ? ?Vitals:  ? 02/27/22 0928  ?BP: 118/74  ?Weight: 157 lb (71.2 kg)  ?Height: '5\' 1"'$  (1.549 m)  ? ? ?Body mass index is 29.66 kg/m?. ? ?General appearance:  Normal ?Thyroid:  Symmetrical, normal in size, without palpable masses or nodularity. ?Respiratory ? Auscultation:  Clear without wheezing or rhonchi ?Cardiovascular ? Auscultation:  Regular rate, without rubs, murmurs or gallops ? Edema/varicosities:  Not grossly evident ?Abdominal ? Soft,nontender, without masses, guarding or rebound. ? Liver/spleen:  No organomegaly noted ? Hernia:  None appreciated ? Skin ? Inspection:  Grossly normal ?  ?Breasts: Examined lying and sitting.  ? Right: Without masses, retractions, discharge or axillary adenopathy. ? ? Left: Without masses, retractions, discharge or axillary  adenopathy. ?Genitourinary  ? Inguinal/mons:  Normal without inguinal adenopathy ? External genitalia:  Normal appearing vulva with no masses, tenderness, or lesions ? BUS/Urethra/Skene's glands:  Normal ? Vagina:  Atrophic changes ? Cervix:  Normal appearing without discharge or lesions ? Uterus:  Normal in size, shape and contour.  Midline and mobile, nontender ? Adnexa/parametria:   ?  Rt: Normal in size, without masses or tenderness. ?  Lt: Normal in size, without masses or tenderness. ? Anus and perineum: Normal ? Digital rectal exam: Normal sphincter tone without palpated masses or tenderness ? ?Patient informed chaperone available to be present for breast and pelvic exam. Patient has requested no chaperone to be present. Patient has been advised what will be completed during breast and pelvic exam.  ? ?Assessment/Plan:  66 y.o. G3P0030 for annual exam.  ? ?Well female exam with routine gynecological exam - Plan: Cytology - PAP( [place]). Education provided on SBEs, importance of preventative screenings, current guidelines, high calcium diet, regular exercise, and multivitamin daily. ? ?Postmenopausal - no HRT, no bleeding ? ?Papanicolaou smear of cervix with positive high risk human papilloma virus (HPV) test - Plan: Cytology - PAP( [place]).  ? ?Fatigue, unspecified type - Plan: CBC with Differential/Platelet, Comprehensive metabolic panel, VITAMIN D 25 Hydroxy (Vit-D Deficiency, Fractures), Vitamin B12, TSH. She also has poor sleep, always has. She struggles with falling asleep and staying asleep. She has tried melatonin in the past with no improvement. She has Xanax as needed but does not like to take. Recommend increasing activity and improving sleep hygiene routine.  ? ?Family history of thyroid disease - Plan: TSH. Mother and sister with thyroid disease. 04/06/2020 TSH was 5.17. Will recheck today due to worsening fatigue.  ? ?  Screening for cervical cancer - Cryosurgery >20 years ago, normal  since. 2022 normal cytology pos HR HPV neg 16/18/45. Pap with HR HPV today.  ? ?Screening for breast cancer - Normal mammogram history. Continue annual screenings. Normal breast exam today. ? ?Screening for colon cancer - 2019 colonoscopy.  Will repeat at 5-year interval per GI's recommendation.  ? ?Screening for osteoporosis - Normal bone density March 2022. Will repeat DXA in 2027 per recommendation.  ? ?Follow-up in 1 year for annual. ? ? ? ?Tamela Gammon [facility], 9:43 AM 02/27/2022 ? ?

## 2022-02-27 ENCOUNTER — Other Ambulatory Visit (HOSPITAL_COMMUNITY)
Admission: RE | Admit: 2022-02-27 | Discharge: 2022-02-27 | Disposition: A | Discharge status: Home / Self Care | Source: Ambulatory Visit | Attending: Nurse Practitioner | Admitting: Nurse Practitioner

## 2022-02-27 ENCOUNTER — Encounter: Payer: Self-pay | Admitting: Nurse Practitioner

## 2022-02-27 ENCOUNTER — Ambulatory Visit (INDEPENDENT_AMBULATORY_CARE_PROVIDER_SITE_OTHER): Admitting: Nurse Practitioner

## 2022-02-27 ENCOUNTER — Other Ambulatory Visit: Payer: Self-pay

## 2022-02-27 VITALS — BP 118/74 | Ht 61.0 in | Wt 157.0 lb

## 2022-02-27 DIAGNOSIS — Z01419 Encounter for gynecological examination (general) (routine) without abnormal findings: Secondary | ICD-10-CM | POA: Diagnosis present

## 2022-02-27 DIAGNOSIS — R5383 Other fatigue: Secondary | ICD-10-CM | POA: Diagnosis not present

## 2022-02-27 DIAGNOSIS — R87810 Cervical high risk human papillomavirus (HPV) DNA test positive: Secondary | ICD-10-CM

## 2022-02-27 DIAGNOSIS — Z780 Asymptomatic menopausal state: Secondary | ICD-10-CM

## 2022-02-27 DIAGNOSIS — Z8349 Family history of other endocrine, nutritional and metabolic diseases: Secondary | ICD-10-CM

## 2022-02-28 LAB — CBC WITH DIFFERENTIAL/PLATELET
Absolute Monocytes: 380 cells/uL (ref 200–950)
Basophils Absolute: 30 cells/uL (ref 0–200)
Basophils Relative: 0.6 %
Eosinophils Absolute: 180 cells/uL (ref 15–500)
Eosinophils Relative: 3.6 %
HCT: 41.4 % (ref 35.0–45.0)
Hemoglobin: 13.7 g/dL (ref 11.7–15.5)
Lymphs Abs: 2165 cells/uL (ref 850–3900)
MCH: 29.7 pg (ref 27.0–33.0)
MCHC: 33.1 g/dL (ref 32.0–36.0)
MCV: 89.8 fL (ref 80.0–100.0)
MPV: 12.5 fL (ref 7.5–12.5)
Monocytes Relative: 7.6 %
Neutro Abs: 2245 cells/uL (ref 1500–7800)
Neutrophils Relative %: 44.9 %
Platelets: 290 10*3/uL (ref 140–400)
RBC: 4.61 10*6/uL (ref 3.80–5.10)
RDW: 12.8 % (ref 11.0–15.0)
Total Lymphocyte: 43.3 %
WBC: 5 10*3/uL (ref 3.8–10.8)

## 2022-02-28 LAB — VITAMIN B12: Vitamin B-12: 407 pg/mL (ref 200–1100)

## 2022-02-28 LAB — COMPREHENSIVE METABOLIC PANEL
AG Ratio: 1.9 (calc) (ref 1.0–2.5)
ALT: 12 U/L (ref 6–29)
AST: 16 U/L (ref 10–35)
Albumin: 4.2 g/dL (ref 3.6–5.1)
Alkaline phosphatase (APISO): 70 U/L (ref 37–153)
BUN: 18 mg/dL (ref 7–25)
CO2: 22 mmol/L (ref 20–32)
Calcium: 9.5 mg/dL (ref 8.6–10.4)
Chloride: 108 mmol/L (ref 98–110)
Creat: 0.82 mg/dL (ref 0.50–1.05)
Globulin: 2.2 g/dL (calc) (ref 1.9–3.7)
Glucose, Bld: 78 mg/dL (ref 65–99)
Potassium: 4.4 mmol/L (ref 3.5–5.3)
Sodium: 142 mmol/L (ref 135–146)
Total Bilirubin: 0.4 mg/dL (ref 0.2–1.2)
Total Protein: 6.4 g/dL (ref 6.1–8.1)

## 2022-02-28 LAB — TSH: TSH: 3.36 mIU/L (ref 0.40–4.50)

## 2022-02-28 LAB — VITAMIN D 25 HYDROXY (VIT D DEFICIENCY, FRACTURES): Vit D, 25-Hydroxy: 28 ng/mL — ABNORMAL LOW (ref 30–100)

## 2022-03-01 LAB — CYTOLOGY - PAP
Comment: NEGATIVE
Diagnosis: NEGATIVE
Diagnosis: REACTIVE
High risk HPV: NEGATIVE

## 2022-03-04 ENCOUNTER — Other Ambulatory Visit: Payer: Self-pay | Admitting: Nurse Practitioner

## 2022-03-04 DIAGNOSIS — E559 Vitamin D deficiency, unspecified: Secondary | ICD-10-CM

## 2022-03-04 MED ORDER — VITAMIN D (ERGOCALCIFEROL) 1.25 MG (50000 UNIT) PO CAPS: 50000.0000 [IU] | ORAL_CAPSULE | ORAL | 0 refills | Status: AC

## 2022-05-03 ENCOUNTER — Other Ambulatory Visit: Payer: Self-pay | Admitting: Nurse Practitioner

## 2022-05-03 DIAGNOSIS — E559 Vitamin D deficiency, unspecified: Secondary | ICD-10-CM

## 2022-05-03 NOTE — Telephone Encounter (Signed)
Tiffany -Rx request received for Vit D 50,000 IU. Last Rx sent on 03/04/22.   Please advise if refill appropriate or if f/u labs needed.

## 2022-05-06 ENCOUNTER — Encounter: Payer: Self-pay | Admitting: Internal Medicine

## 2022-05-06 NOTE — Telephone Encounter (Signed)
She can now take OTC Vitamin D 1000-2000 IU daily. She does not need the high dose supplement for longer than recommended.

## 2022-05-06 NOTE — Telephone Encounter (Signed)
Spoke with patient, advised per Jonelle Sidle, NP. Patient verbalizes understanding and is agreeable.   Encounter closed.

## 2022-05-10 IMAGING — DX DG CHEST 1V PORT
1 series · 1 of 1 positions shown · non-contrast
Comparison: None.

CLINICAL DATA: Shortness of breath.

EXAM:
PORTABLE CHEST 1 VIEW

[chest ap]
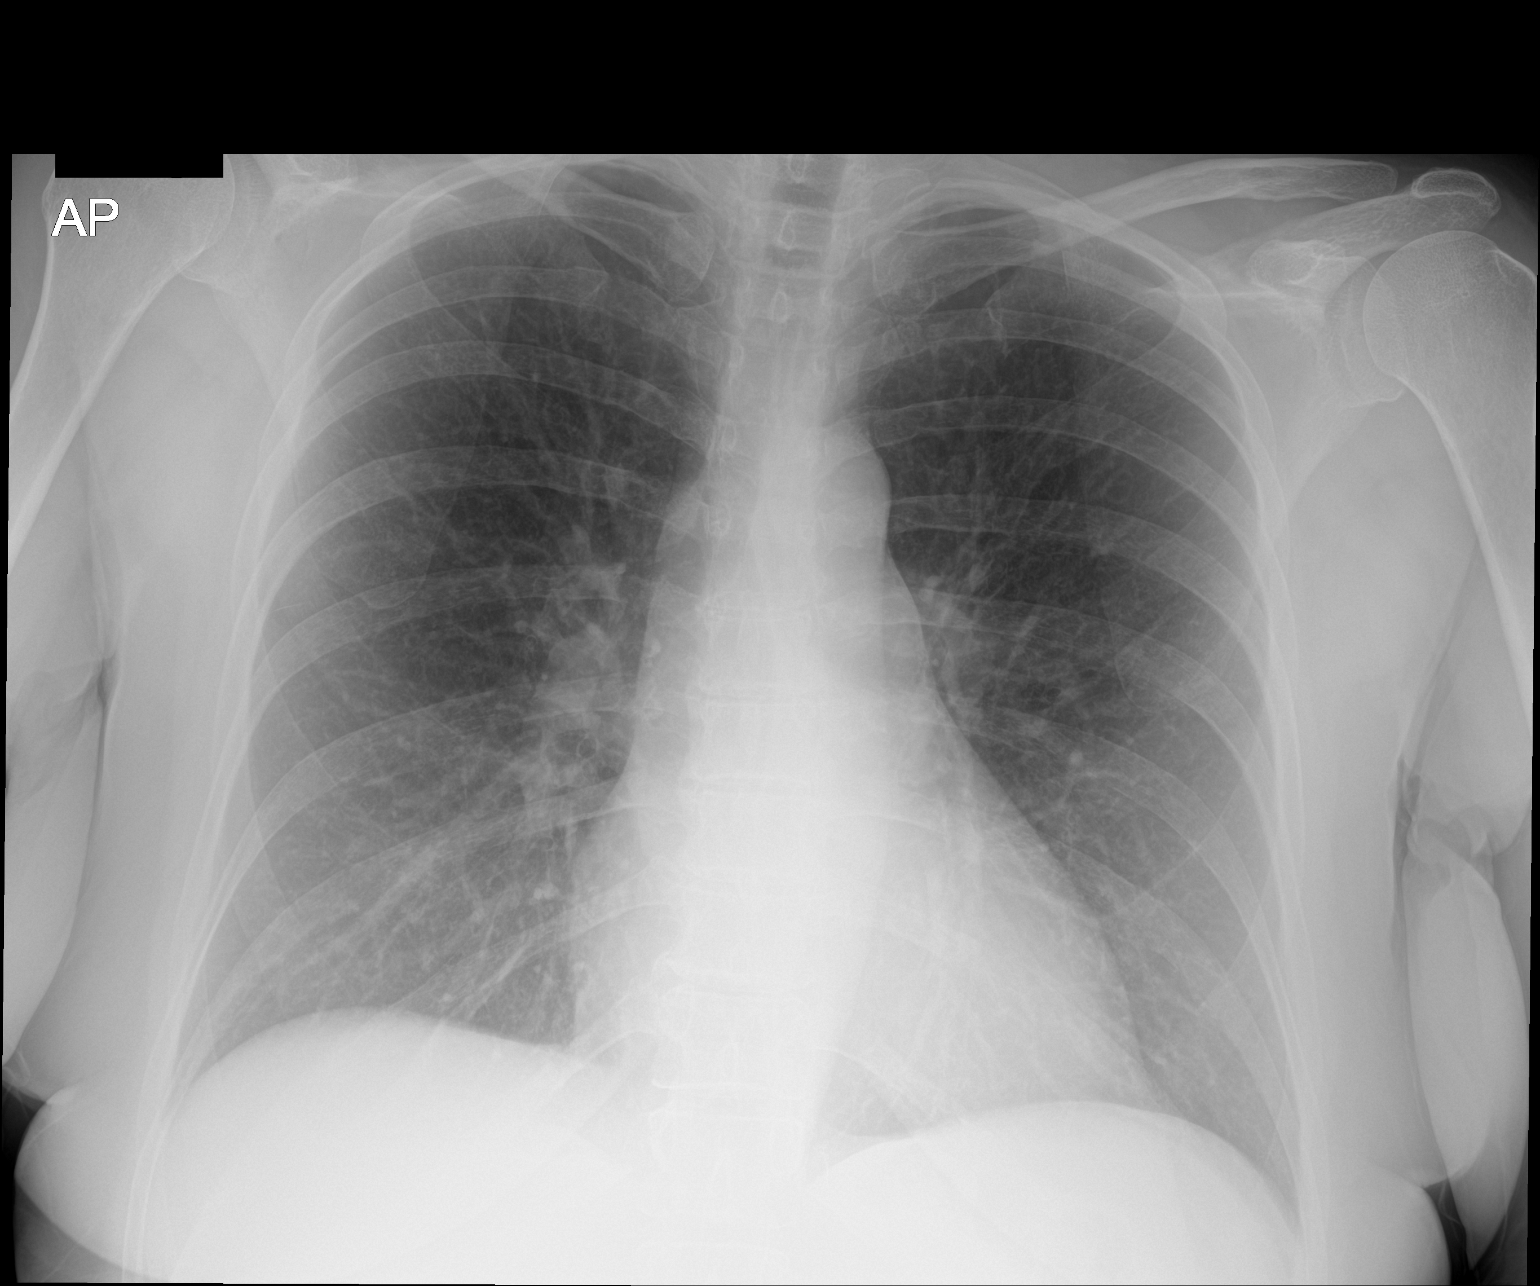

[1 of 1 positions shown; findings below may reference images not displayed]

FINDINGS: The heart size and mediastinal contours are within normal limits.
Both lungs are clear. The visualized skeletal structures are
unremarkable.
IMPRESSION: No active disease.

## 2022-11-29 DIAGNOSIS — Z87891 Personal history of nicotine dependence: Secondary | ICD-10-CM | POA: Diagnosis not present

## 2022-11-29 DIAGNOSIS — I739 Peripheral vascular disease, unspecified: Secondary | ICD-10-CM | POA: Diagnosis not present

## 2022-11-29 DIAGNOSIS — Z880 Allergy status to penicillin: Secondary | ICD-10-CM | POA: Diagnosis not present

## 2022-11-29 DIAGNOSIS — Z008 Encounter for other general examination: Secondary | ICD-10-CM | POA: Diagnosis not present

## 2022-11-29 DIAGNOSIS — Z800 Family history of malignant neoplasm of digestive organs: Secondary | ICD-10-CM | POA: Diagnosis not present

## 2022-11-29 DIAGNOSIS — Z833 Family history of diabetes mellitus: Secondary | ICD-10-CM | POA: Diagnosis not present

## 2022-12-04 DIAGNOSIS — H52209 Unspecified astigmatism, unspecified eye: Secondary | ICD-10-CM | POA: Diagnosis not present

## 2022-12-04 DIAGNOSIS — H5203 Hypermetropia, bilateral: Secondary | ICD-10-CM | POA: Diagnosis not present

## 2022-12-04 DIAGNOSIS — H524 Presbyopia: Secondary | ICD-10-CM | POA: Diagnosis not present

## 2022-12-11 ENCOUNTER — Encounter: Payer: Self-pay | Admitting: Family Medicine

## 2022-12-11 ENCOUNTER — Ambulatory Visit: Payer: TRICARE For Life (TFL) | Admitting: Family Medicine

## 2022-12-11 VITALS — BP 120/64 | HR 81 | Temp 98.4°F | Resp 16 | Ht 62.0 in | Wt 156.6 lb

## 2022-12-11 DIAGNOSIS — Z0000 Encounter for general adult medical examination without abnormal findings: Secondary | ICD-10-CM

## 2022-12-11 DIAGNOSIS — Z23 Encounter for immunization: Secondary | ICD-10-CM

## 2022-12-11 DIAGNOSIS — Z1231 Encounter for screening mammogram for malignant neoplasm of breast: Secondary | ICD-10-CM

## 2022-12-11 DIAGNOSIS — F32A Depression, unspecified: Secondary | ICD-10-CM | POA: Diagnosis not present

## 2022-12-11 DIAGNOSIS — R6889 Other general symptoms and signs: Secondary | ICD-10-CM | POA: Diagnosis not present

## 2022-12-11 DIAGNOSIS — Z114 Encounter for screening for human immunodeficiency virus [HIV]: Secondary | ICD-10-CM | POA: Diagnosis not present

## 2022-12-11 DIAGNOSIS — Z1159 Encounter for screening for other viral diseases: Secondary | ICD-10-CM

## 2022-12-11 DIAGNOSIS — R69 Illness, unspecified: Secondary | ICD-10-CM | POA: Diagnosis not present

## 2022-12-11 DIAGNOSIS — Z8249 Family history of ischemic heart disease and other diseases of the circulatory system: Secondary | ICD-10-CM | POA: Diagnosis not present

## 2022-12-11 LAB — COMPREHENSIVE METABOLIC PANEL
ALT: 14 U/L (ref 0–35)
AST: 19 U/L (ref 0–37)
Albumin: 4.3 g/dL (ref 3.5–5.2)
Alkaline Phosphatase: 72 U/L (ref 39–117)
BUN: 11 mg/dL (ref 6–23)
CO2: 26 mEq/L (ref 19–32)
Calcium: 9.6 mg/dL (ref 8.4–10.5)
Chloride: 107 mEq/L (ref 96–112)
Creatinine, Ser: 0.77 mg/dL (ref 0.40–1.20)
GFR: 81.08 mL/min (ref >60.00)
Glucose, Bld: 86 mg/dL (ref 70–99)
Potassium: 4.2 mEq/L (ref 3.5–5.1)
Sodium: 140 mEq/L (ref 135–145)
Total Bilirubin: 0.4 mg/dL (ref 0.2–1.2)
Total Protein: 6.7 g/dL (ref 6.0–8.3)

## 2022-12-11 LAB — LIPID PANEL
Cholesterol: 230 mg/dL — ABNORMAL HIGH (ref 0–200)
HDL: 61.7 mg/dL (ref >39.00)
LDL Cholesterol: 142 mg/dL — ABNORMAL HIGH (ref 0–99)
NonHDL: 168.31
Total CHOL/HDL Ratio: 4
Triglycerides: 131 mg/dL (ref 0.0–149.0)
VLDL: 26.2 mg/dL (ref 0.0–40.0)

## 2022-12-11 LAB — CBC WITH DIFFERENTIAL/PLATELET
Basophils Absolute: 0 10*3/uL (ref 0.0–0.1)
Basophils Relative: 0.8 % (ref 0.0–3.0)
Eosinophils Absolute: 0.1 10*3/uL (ref 0.0–0.7)
Eosinophils Relative: 2.3 % (ref 0.0–5.0)
HCT: 40.7 % (ref 36.0–46.0)
Hemoglobin: 13.5 g/dL (ref 12.0–15.0)
Lymphocytes Relative: 40.1 % (ref 12.0–46.0)
Lymphs Abs: 2.1 10*3/uL (ref 0.7–4.0)
MCHC: 33.2 g/dL (ref 30.0–36.0)
MCV: 90.4 fl (ref 78.0–100.0)
Monocytes Absolute: 0.4 10*3/uL (ref 0.1–1.0)
Monocytes Relative: 7.4 % (ref 3.0–12.0)
Neutro Abs: 2.6 10*3/uL (ref 1.4–7.7)
Neutrophils Relative %: 49.4 % (ref 43.0–77.0)
Platelets: 274 10*3/uL (ref 150.0–400.0)
RBC: 4.51 Mil/uL (ref 3.87–5.11)
RDW: 13.7 % (ref 11.5–15.5)
WBC: 5.3 10*3/uL (ref 4.0–10.5)

## 2022-12-11 LAB — TSH: TSH: 5.85 u[IU]/mL — ABNORMAL HIGH (ref 0.35–5.50)

## 2022-12-11 NOTE — Progress Notes (Signed)
Complete physical exam  Patient: Debra Norton   DOB: 02/25/1956   66 y.o. Female  MRN: [id number]  Subjective:    Chief Complaint  Patient presents with   Annual Exam    Debra Norton is a 66 y.o. female who presents today for a complete physical exam. She reports consuming a general diet. The patient does not participate in regular exercise at present. She generally feels well. She reports sleeping fairly well. She does not have additional problems to discuss today.   She would like dermatology referral for screening.  She would like a referral for counseling.   Reports someone from [place] told her she may have PAD based on their screening. She denies any symptoms. Good pulses and cap refill today. Will go ahead and order ABIs to further evaluate.     Most recent fall risk assessment:     12/11/2022    9:51 AM  [place] in the past year? 0  Number falls in past yr: 0  Injury with Fall? 0     Most recent depression screenings:    12/11/2022   10:25 AM 10/12/2021   11:16 AM  PHQ 2/9 Scores  PHQ - 2 Score 2 0  PHQ- 9 Score 9       12/11/2022   10:25 AM  GAD 7 : Generalized Anxiety Score  Nervous, Anxious, on Edge 0  Control/stop worrying 0  Worry too much - different things 0  Trouble relaxing 1  Restless 0  Easily annoyed or irritable 1  Afraid - awful might happen 0  Total GAD 7 Score 2  Anxiety Difficulty Not difficult at all      Vision:Not within last year  and Dental: No current dental problems and Receives regular dental care  Patient Active Problem List   Diagnosis Date Noted   Absolute anemia 02/03/2018   Fatigue associated with anemia 02/03/2018   Postoperative hypoxemia 10/20/2017   Hypercapnia 10/20/2017   At risk for obstructive sleep apnea 10/20/2017   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 1st cousin   CAD Father        41   Breast cancer Neg Hx    Esophageal cancer Neg Hx    Pancreatic cancer Neg Hx    Allergies  Allergen Reactions   Diphenhydramine Hcl     REACTION: VERY WIRED/palpitations  Benadryl   Flagyl [Metronidazole] Nausea And Vomiting   Penicillins     REACTION: HIVES   Tetanus Toxoid     REACTION: REALLY BAD SWELLING   Clindamycin/Lincomycin Rash    Rash shortly after she finished a round of abx , see OV note 05-10-15   Hydrocodone     Insomnia, itching (mild)      Patient Care Team: Terrilyn Saver, NP as PCP - General (Family Medicine) Huel Cote, NP (Inactive) as Nurse Practitioner (Obstetrics and Gynecology) Rolm Bookbinder, MD as Consulting Physician (General Surgery)  Outpatient Medications Prior to Visit  Medication Sig   ALPRAZolam (XANAX) 0.25 MG tablet Take 1 tablet (0.25 mg total) by mouth at bedtime as needed for anxiety.   Ascorbic Acid (VITA-C PO) Take 1 tablet by mouth daily.   Multiple Vitamins-Minerals (MULTIMINERAL PLUS) TABS Take 2 tablets by mouth daily.   [DISCONTINUED] albuterol (VENTOLIN HFA) 108 (90 Base) MCG/ACT inhaler Inhale 2 puffs into the lungs every 6 (six) hours as needed for wheezing.   [DISCONTINUED] fluticasone (FLONASE) 50 MCG/ACT nasal spray Place 2 sprays into both nostrils daily. (Patient not taking: Reported on 02/27/2022)   No facility-administered medications prior to visit.    ROS All review of systems negative except what is listed in the HPI        Objective:     BP 120/64   Pulse 81   Temp 98.4 F (36.9 C)   Resp 16   Ht '5\' 2"'$  (1.575 m)   Wt 156 lb 9.6 oz (71 kg)    SpO2 (!) 81%   BMI 28.64 kg/m    Physical Exam Vitals reviewed.  Constitutional:      General: She is not in acute distress.    Appearance: Normal appearance. She is not ill-appearing.  HENT:     Head: Normocephalic and atraumatic.     Right Ear: Tympanic membrane normal.     Left Ear: Tympanic membrane normal.     Nose: Nose normal.     Mouth/Throat:     Mouth: Mucous membranes are moist.     Pharynx: Oropharynx is clear.  Eyes:     Extraocular Movements: Extraocular movements intact.     Conjunctiva/sclera: Conjunctivae normal.     Pupils: Pupils are equal, round, and reactive to light.  Neck:     Vascular: No carotid bruit.  Cardiovascular:     Rate and Rhythm: Normal rate and regular rhythm.     Pulses: Normal pulses.     Heart sounds: Normal heart sounds.  Pulmonary:     Effort: Pulmonary effort is normal.     Breath sounds: Normal breath sounds.  Abdominal:     General: Abdomen is flat. Bowel sounds are normal. There is no distension.     Palpations: Abdomen is soft. There is no mass.     Tenderness: There is no abdominal tenderness. There is no right CVA tenderness, left CVA tenderness, guarding or rebound.  Genitourinary:    Comments: Deferred exam Musculoskeletal:        General: Normal range of motion.     Cervical back: Normal range of motion and neck supple. No tenderness.     Right lower leg: No edema.     Left lower leg: No edema.  Lymphadenopathy:     Cervical: No cervical adenopathy.  Skin:    General: Skin is warm and dry.     Capillary Refill: Capillary refill takes less than 2 seconds.  Neurological:     General: No focal deficit present.     Mental Status: She is alert and oriented to person, place, and time. Mental status is at baseline.  Psychiatric:        Mood and Affect: Mood normal.        Behavior: Behavior normal.        Thought Content: Thought content normal.        Judgment: Judgment normal.        No results found for any  visits on 12/11/22.     Assessment &  Plan:    Routine Health Maintenance and Physical Exam  Immunization History  Administered Date(s) Administered   PFIZER(Purple Top)SARS-COV-2 Vaccination 02/23/2020, 03/15/2020, 10/15/2020, 05/23/2021   PNEUMOCOCCAL CONJUGATE-20 12/11/2022   Pfizer Covid-19 Vaccine Bivalent Booster 33yr & up 12/26/2021    Health Maintenance  Topic Date Due   Medicare Annual Wellness (AWV)  Never done   Zoster Vaccines- Shingrix (1 of 2) Never done   MAMMOGRAM  07/10/2022   COVID-19 Vaccine (6 - 2023-24 season) 08/16/2022   INFLUENZA VACCINE  03/16/2023 (Originally 07/16/2022)   COLONOSCOPY (Pts 45-420yrInsurance coverage will need to be confirmed)  11/07/2023   PAP SMEAR-Modifier  02/27/2025   Pneumonia Vaccine 6532Years old  Completed   DEXA SCAN  Completed   Hepatitis C Screening  Completed   HIV Screening  Completed   HPV VACCINES  Aged Out   DTaP/Tdap/Td  Discontinued  - Declined vaccines at this time   Discussed health benefits of physical activity, and encouraged her to engage in regular exercise appropriate for her age and condition.  Problem List Items Addressed This Visit       Other   Annual physical exam - Primary   Relevant Orders   CBC with Differential/Platelet   Comprehensive metabolic panel   Lipid panel   TSH   MM 3D SCREEN BREAST BILATERAL   HIV Antibody (routine testing w rflx)   Hepatitis C antibody   Ambulatory referral to Dermatology   Other Visit Diagnoses     Encounter for screening mammogram for malignant neoplasm of breast       Relevant Orders   MM 3D SCREEN BREAST BILATERAL   Encounter for screening for HIV       Relevant Orders   HIV Antibody (routine testing w rflx)   Encounter for hepatitis C screening test for low risk patient       Relevant Orders   Hepatitis C antibody   Depression, unspecified depression type     Reports several deaths in a short period of time a few years ago that is still hard for her  to handle some days, but feels like she is doing okay overall. She would like a referral for counseling.  No SI/HI.   Relevant Orders   Ambulatory referral to Behavioral Health     Family history of cardiovascular disease Abnormality on screening test Abnormal medicare screening. No symptoms at this time. Normal exam. Order ABI to further evaluate.  - VAS [place] WITH/WO TBI; Future     Return for schedule AWV, physical 1 year.     TaTerrilyn SaverNP

## 2022-12-12 ENCOUNTER — Other Ambulatory Visit: Payer: Medicare HMO

## 2022-12-12 DIAGNOSIS — L821 Other seborrheic keratosis: Secondary | ICD-10-CM | POA: Diagnosis not present

## 2022-12-12 DIAGNOSIS — D1801 Hemangioma of skin and subcutaneous tissue: Secondary | ICD-10-CM | POA: Diagnosis not present

## 2022-12-12 DIAGNOSIS — R946 Abnormal results of thyroid function studies: Secondary | ICD-10-CM

## 2022-12-12 DIAGNOSIS — L720 Epidermal cyst: Secondary | ICD-10-CM | POA: Diagnosis not present

## 2022-12-12 DIAGNOSIS — L814 Other melanin hyperpigmentation: Secondary | ICD-10-CM | POA: Diagnosis not present

## 2022-12-12 DIAGNOSIS — D225 Melanocytic nevi of trunk: Secondary | ICD-10-CM | POA: Diagnosis not present

## 2022-12-12 LAB — HIV ANTIBODY (ROUTINE TESTING W REFLEX): HIV 1&2 Ab, 4th Generation: NONREACTIVE

## 2022-12-12 LAB — HEPATITIS C ANTIBODY: Hepatitis C Ab: NONREACTIVE

## 2022-12-12 NOTE — Addendum Note (Signed)
Addended by: Caleen Jobs B on: 12/12/2022 04:04 PM   Modules accepted: Orders

## 2022-12-13 ENCOUNTER — Encounter: Payer: Self-pay | Admitting: Family Medicine

## 2022-12-13 DIAGNOSIS — E559 Vitamin D deficiency, unspecified: Secondary | ICD-10-CM

## 2022-12-13 LAB — T4: T4, Total: 7.3 ug/dL (ref 5.1–11.9)

## 2022-12-17 ENCOUNTER — Other Ambulatory Visit: Payer: Self-pay | Admitting: Nurse Practitioner

## 2022-12-17 DIAGNOSIS — F411 Generalized anxiety disorder: Secondary | ICD-10-CM

## 2022-12-17 NOTE — Telephone Encounter (Signed)
RX refill request Xanax 0.25 mg prn Last Annual exam 02/27/2022 Routing to Provider  for approval

## 2022-12-18 ENCOUNTER — Other Ambulatory Visit: Payer: Self-pay | Admitting: Family Medicine

## 2022-12-18 DIAGNOSIS — Z1231 Encounter for screening mammogram for malignant neoplasm of breast: Secondary | ICD-10-CM | POA: Diagnosis not present

## 2022-12-19 ENCOUNTER — Other Ambulatory Visit: Payer: Self-pay

## 2022-12-19 ENCOUNTER — Other Ambulatory Visit (HOSPITAL_COMMUNITY): Payer: Self-pay

## 2022-12-19 ENCOUNTER — Encounter (HOSPITAL_COMMUNITY): Payer: Self-pay

## 2022-12-19 MED ORDER — ALPRAZOLAM 0.25 MG PO TABS
0.2500 mg | ORAL_TABLET | Freq: Every evening | ORAL | 0 refills | Status: DC | PRN
  Filled 2022-12-19: qty 30, 30d supply, fill #0

## 2022-12-23 ENCOUNTER — Ambulatory Visit (HOSPITAL_COMMUNITY)
Admission: RE | Admit: 2022-12-23 | Discharge: 2022-12-23 | Disposition: A | Discharge status: Home / Self Care | Payer: Medicare HMO | Source: Ambulatory Visit | Attending: Family Medicine | Admitting: Family Medicine

## 2022-12-23 DIAGNOSIS — Z8249 Family history of ischemic heart disease and other diseases of the circulatory system: Secondary | ICD-10-CM | POA: Insufficient documentation

## 2022-12-23 DIAGNOSIS — R6889 Other general symptoms and signs: Secondary | ICD-10-CM | POA: Diagnosis not present

## 2022-12-23 LAB — VAS US ABI WITH/WO TBI
Left ABI: 1.1
Right ABI: 1.1

## 2022-12-23 NOTE — Progress Notes (Signed)
VASCULAR LAB    ABI has been performed.  See CV proc for preliminary results.   Javonta Gronau, RVT 12/23/2022, 4:39 PM

## 2022-12-25 DIAGNOSIS — R922 Inconclusive mammogram: Secondary | ICD-10-CM | POA: Diagnosis not present

## 2022-12-25 DIAGNOSIS — R928 Other abnormal and inconclusive findings on diagnostic imaging of breast: Secondary | ICD-10-CM | POA: Diagnosis not present

## 2023-01-02 ENCOUNTER — Other Ambulatory Visit: Payer: Self-pay

## 2023-01-02 DIAGNOSIS — D0511 Intraductal carcinoma in situ of right breast: Secondary | ICD-10-CM | POA: Diagnosis not present

## 2023-01-03 ENCOUNTER — Encounter: Payer: Self-pay | Admitting: Family Medicine

## 2023-01-06 ENCOUNTER — Telehealth: Payer: Self-pay | Admitting: Hematology and Oncology

## 2023-01-06 NOTE — Telephone Encounter (Signed)
Spoke to patient to confirm upcoming afternoon [facility] clinic appointment on 1/31, paperwork will be sent via [place].   Gave location and time, also informed patient that the surgeon's office would be calling as well to get information from them similar to the packet that they will be receiving so make sure to do both.  Reminded patient that all providers will be coming to the clinic to see them HERE and if they had any questions to not hesitate to reach back out to myself or their navigators.

## 2023-01-13 ENCOUNTER — Encounter: Payer: Self-pay | Admitting: *Deleted

## 2023-01-13 DIAGNOSIS — D0511 Intraductal carcinoma in situ of right breast: Secondary | ICD-10-CM

## 2023-01-13 NOTE — Progress Notes (Signed)
Radiation Oncology         [phone] ________________________________  Name: Debra Norton        MRN: [id number]  Date of Service: 01/15/2023 DOB: 1956/12/06  WN:UUVO, Purcell Nails, NP  Stark Klein, MD     REFERRING PHYSICIAN: Stark Klein, MD   DIAGNOSIS: The encounter diagnosis was Ductal carcinoma in situ (DCIS) of right breast.   HISTORY OF PRESENT ILLNESS: Debra Norton is a 67 y.o. female seen in the multidisciplinary breast clinic for a new diagnosis of right breast cancer. The patient presented for screening mammography on 12/18/2022 which showed a developing density versus under compressed tissue of the right breast.  She returned for diagnostic imaging of the right breast which showed a mass in the inner aspect of the right breast.  By ultrasound a mass was seen in the 3-3 30 o'clock position consistent with the mammographic findings with architectural distortion measuring up to 1.6 cm.  Her axilla was negative for adenopathy.  She underwent a biopsy on 01/03/2023 which showed high-grade DCIS.  Her cancer was***she is seen today to discuss treatment recommendations for her cancer.    PREVIOUS RADIATION THERAPY: {EXAM; YES/NO:19492::"No"}   PAST MEDICAL HISTORY:  Past Medical History:  Diagnosis Date   Anxiety    Cervical dysplasia    Cholelithiasis    36m stone seen on ultrasound in 2009   Colon polyp    Diverticulitis    Migraine headache    Vertigo        PAST SURGICAL HISTORY: Past Surgical History:  Procedure Laterality Date   CHOLECYSTECTOMY     COLPOSCOPY     ECTOPIC PREGNANCY SURGERY     EXTERNAL EAR SURGERY Left    GYNECOLOGIC CRYOSURGERY       FAMILY HISTORY:  Family History  Problem Relation Age of Onset   Colon cancer Mother        in her 454s  Diabetes Sister    Ovarian cancer Cousin        Mat. 1st cousin   CAD Father        855  Breast cancer Neg Hx    Esophageal cancer Neg Hx    Pancreatic cancer Neg Hx       SOCIAL HISTORY:  reports that she has quit smoking. She has never used smokeless tobacco. She reports current alcohol use of about 2.0 standard drinks of alcohol per week. She reports that she does not use drugs.  The patient is widowed and [place]  She works for [employer] a***   ALLERGIES: Diphenhydramine hcl, Flagyl [metronidazole], Penicillins, Tetanus toxoid, Clindamycin/lincomycin, and Hydrocodone   MEDICATIONS:  Current Outpatient Medications  Medication Sig Dispense Refill   ALPRAZolam (XANAX) 0.25 MG tablet Take 1 tablet (0.25 mg total) by mouth at bedtime as needed for anxiety. 30 tablet 0   Ascorbic Acid (VITA-C PO) Take 1 tablet by mouth daily.     Multiple Vitamins-Minerals (MULTIMINERAL PLUS) TABS Take 2 tablets by mouth daily.     No current facility-administered medications for this visit.     REVIEW OF SYSTEMS: On review of systems, the patient reports that she is doing ***     PHYSICAL EXAM:  Wt Readings from Last 3 Encounters:  12/11/22 156 lb 9.6 oz (71 kg)  02/27/22 157 lb (71.2 kg)  01/09/22 156 lb 9.6 oz (71 kg)   Temp Readings from Last 3 Encounters:  12/11/22 98.4 F (36.9 C)  01/09/22  100.2 F (37.9 C) (Oral)  10/17/21 98.7 F (37.1 C) (Oral)   BP Readings from Last 3 Encounters:  12/11/22 120/64  02/27/22 118/74  01/09/22 108/70   Pulse Readings from Last 3 Encounters:  12/11/22 81  01/15/22 90  01/09/22 (!) 121    In general this is a well appearing Caucasian female in no acute distress. She's alert and oriented x4 and appropriate throughout the examination. Cardiopulmonary assessment is negative for acute distress and she exhibits normal effort. Bilateral breast exam is deferred.    ECOG = ***  0 - Asymptomatic (Fully active, able to carry on all predisease activities without restriction)  1 - Symptomatic but completely ambulatory (Restricted in physically strenuous activity but ambulatory and able to carry out  work of a light or sedentary nature. For example, light housework, office work)  2 - Symptomatic, <50% in bed during the day (Ambulatory and capable of all self care but unable to carry out any work activities. Up and about more than 50% of waking hours)  3 - Symptomatic, >50% in bed, but not bedbound (Capable of only limited self-care, confined to bed or chair 50% or more of waking hours)  4 - Bedbound (Completely disabled. Cannot carry on any self-care. Totally confined to bed or chair)  5 - Death   Eustace Pen MM, Creech RH, Tormey DC, et al. [phone]). "Toxicity and response criteria of the [facility]". [place] Oncol. 5 (6): 649-55    LABORATORY DATA:  Lab Results  Component Value Date   WBC 5.3 12/11/2022   HGB 13.5 12/11/2022   HCT 40.7 12/11/2022   MCV 90.4 12/11/2022   PLT 274.0 12/11/2022   Lab Results  Component Value Date   NA 140 12/11/2022   K 4.2 12/11/2022   CL 107 12/11/2022   CO2 26 12/11/2022   Lab Results  Component Value Date   ALT 14 12/11/2022   AST 19 12/11/2022   ALKPHOS 72 12/11/2022   BILITOT 0.4 12/11/2022      RADIOGRAPHY: VAS [place] ABI WITH/WO TBI  Result Date: 12/23/2022  LOWER EXTREMITY DOPPLER STUDY Patient Name:  Debra Norton  Date of Exam:   12/23/2022 Medical Rec #: [id number]              Accession #:    [id number] Date of Birth: 1956-09-08             Patient Gender: F Patient Age:   63 years Exam Location:  [facility] Procedure:      VAS [place] ABI WITH/WO TBI Referring Phys: Caleen Jobs --------------------------------------------------------------------------------  Indications: Abnormal screening through Medicare Other Factors: No risk factors other than family history of CAD.  Comparison Study: No prior study Performing Technologist: Sharion Dove RVS  Examination Guidelines: A complete evaluation includes at minimum, Doppler waveform signals and systolic blood pressure reading at the level of bilateral  brachial, anterior tibial, and posterior tibial arteries, when vessel segments are accessible. Bilateral testing is considered an integral part of a complete examination. Photoelectric Plethysmograph (PPG) waveforms and toe systolic pressure readings are included as required and additional duplex testing as needed. Limited examinations for reoccurring indications may be performed as noted.  ABI Findings: +---------+------------------+-----+-----------+--------+ Right    Rt Pressure (mmHg)IndexWaveform   Comment  +---------+------------------+-----+-----------+--------+ Brachial 134                    triphasic           +---------+------------------+-----+-----------+--------+  PTA      148               1.10 multiphasic         +---------+------------------+-----+-----------+--------+ DP       135               1.01 multiphasic         +---------+------------------+-----+-----------+--------+ Great Toe110               0.82 Normal              +---------+------------------+-----+-----------+--------+ +---------+------------------+-----+-----------+-------+ Left     Lt Pressure (mmHg)IndexWaveform   Comment +---------+------------------+-----+-----------+-------+ Brachial 132                    triphasic          +---------+------------------+-----+-----------+-------+ PTA      147               1.10 multiphasic        +---------+------------------+-----+-----------+-------+ DP       128               0.96 multiphasic        +---------+------------------+-----+-----------+-------+ Great Toe108               0.81 Normal             +---------+------------------+-----+-----------+-------+ +-------+-----------+-----------+------------+------------+ ABI/TBIToday's ABIToday's TBIPrevious ABIPrevious TBI +-------+-----------+-----------+------------+------------+ Right  1.10       0.82                                 +-------+-----------+-----------+------------+------------+ Left   1.10       0.81                                +-------+-----------+-----------+------------+------------+  Summary: Right: Resting right ankle-brachial index is within normal range. The right toe-brachial index is normal. Left: Resting left ankle-brachial index is within normal range. The left toe-brachial index is normal. *See table(s) above for measurements and observations.  Electronically signed by Monica Martinez MD on 12/23/2022 at 5:18:57 PM.    Final        IMPRESSION/PLAN: 1. High grade, *** DCIS of the right breast.  2. Possible genetic predisposition to malignancy. The patient is a candidate for genetic testing given her personal and family history. She will meet with our geneticist today in clinic.   In a visit lasting *** minutes, greater than 50% of the time was spent face to face reviewing her case, as well as in preparation of, discussing, and coordinating the patient's care.  The above documentation reflects my direct findings during this shared patient visit. Please see the separate note by Dr. Lisbeth Renshaw on this date for the remainder of the patient's plan of care.    Carola Rhine, [facility]    **Disclaimer: This note was dictated with voice recognition software. Similar sounding words can inadvertently be transcribed and this note may contain transcription errors which may not have been corrected upon publication of note.**

## 2023-01-14 DIAGNOSIS — C50311 Malignant neoplasm of lower-inner quadrant of right female breast: Secondary | ICD-10-CM | POA: Insufficient documentation

## 2023-01-15 ENCOUNTER — Ambulatory Visit: Payer: Medicare HMO | Admitting: Physical Therapy

## 2023-01-15 ENCOUNTER — Encounter: Payer: Self-pay | Admitting: *Deleted

## 2023-01-15 ENCOUNTER — Inpatient Hospital Stay: Payer: Medicare HMO | Attending: Hematology and Oncology | Admitting: Hematology and Oncology

## 2023-01-15 ENCOUNTER — Inpatient Hospital Stay: Payer: Medicare HMO

## 2023-01-15 ENCOUNTER — Inpatient Hospital Stay (HOSPITAL_BASED_OUTPATIENT_CLINIC_OR_DEPARTMENT_OTHER): Payer: Medicare HMO | Admitting: Genetic Counselor

## 2023-01-15 ENCOUNTER — Other Ambulatory Visit: Payer: Self-pay | Admitting: General Surgery

## 2023-01-15 ENCOUNTER — Ambulatory Visit
Admission: RE | Admit: 2023-01-15 | Discharge: 2023-01-15 | Disposition: A | Discharge status: Home / Self Care | Payer: Medicare HMO | Source: Ambulatory Visit | Attending: Radiation Oncology | Admitting: Radiation Oncology

## 2023-01-15 VITALS — BP 136/73 | HR 71 | Temp 97.0°F | Resp 14 | Wt 155.5 lb

## 2023-01-15 DIAGNOSIS — Z800 Family history of malignant neoplasm of digestive organs: Secondary | ICD-10-CM

## 2023-01-15 DIAGNOSIS — D0511 Intraductal carcinoma in situ of right breast: Secondary | ICD-10-CM

## 2023-01-15 DIAGNOSIS — Z170 Estrogen receptor positive status [ER+]: Secondary | ICD-10-CM | POA: Diagnosis not present

## 2023-01-15 DIAGNOSIS — C50311 Malignant neoplasm of lower-inner quadrant of right female breast: Secondary | ICD-10-CM | POA: Diagnosis not present

## 2023-01-15 DIAGNOSIS — C50211 Malignant neoplasm of upper-inner quadrant of right female breast: Secondary | ICD-10-CM

## 2023-01-15 LAB — CBC WITH DIFFERENTIAL (CANCER CENTER ONLY)
Abs Immature Granulocytes: 0.02 10*3/uL (ref 0.00–0.07)
Basophils Absolute: 0 10*3/uL (ref 0.0–0.1)
Basophils Relative: 1 %
Eosinophils Absolute: 0.1 10*3/uL (ref 0.0–0.5)
Eosinophils Relative: 1 %
HCT: 41.4 % (ref 36.0–46.0)
Hemoglobin: 13.8 g/dL (ref 12.0–15.0)
Immature Granulocytes: 0 %
Lymphocytes Relative: 36 %
Lymphs Abs: 2.7 10*3/uL (ref 0.7–4.0)
MCH: 30 pg (ref 26.0–34.0)
MCHC: 33.3 g/dL (ref 30.0–36.0)
MCV: 90 fL (ref 80.0–100.0)
Monocytes Absolute: 0.5 10*3/uL (ref 0.1–1.0)
Monocytes Relative: 7 %
Neutro Abs: 4.1 10*3/uL (ref 1.7–7.7)
Neutrophils Relative %: 55 %
Platelet Count: 306 10*3/uL (ref 150–400)
RBC: 4.6 MIL/uL (ref 3.87–5.11)
RDW: 13.1 % (ref 11.5–15.5)
WBC Count: 7.4 10*3/uL (ref 4.0–10.5)
nRBC: 0.3 % — ABNORMAL HIGH (ref 0.0–0.2)

## 2023-01-15 LAB — CMP (CANCER CENTER ONLY)
ALT: 12 U/L (ref 0–44)
AST: 18 U/L (ref 15–41)
Albumin: 4.4 g/dL (ref 3.5–5.0)
Alkaline Phosphatase: 73 U/L (ref 38–126)
Anion gap: 6 (ref 5–15)
BUN: 14 mg/dL (ref 8–23)
CO2: 29 mmol/L (ref 22–32)
Calcium: 9.9 mg/dL (ref 8.9–10.3)
Chloride: 105 mmol/L (ref 98–111)
Creatinine: 0.88 mg/dL (ref 0.44–1.00)
GFR, Estimated: >60 mL/min (ref >60)
Glucose, Bld: 92 mg/dL (ref 70–99)
Potassium: 3.8 mmol/L (ref 3.5–5.1)
Sodium: 140 mmol/L (ref 135–145)
Total Bilirubin: 0.5 mg/dL (ref 0.3–1.2)
Total Protein: 7.6 g/dL (ref 6.5–8.1)

## 2023-01-15 LAB — GENETIC SCREENING ORDER

## 2023-01-15 NOTE — Progress Notes (Signed)
[name] NOTE  Patient Care Team: Terrilyn Saver, NP as PCP - General (Family Medicine) Huel Cote, NP (Inactive) as Nurse Practitioner (Obstetrics and Gynecology) Rolm Bookbinder, MD as Consulting Physician (General Surgery) Stark Klein, MD as Consulting Physician (General Surgery) Nicholas Lose, MD as Consulting Physician (Hematology and Oncology) Kyung Rudd, MD as Consulting Physician (Radiation Oncology) Mauro Kaufmann, RN as Oncology Nurse Navigator Rockwell Germany, RN as Oncology Nurse Navigator  CHIEF COMPLAINTS/PURPOSE OF CONSULTATION:  Newly diagnosed breast cancer  HISTORY OF PRESENTING ILLNESS:  Debra Norton 67 y.o. female is here because of recent diagnosis of right breast DCIS.  Patient routine screening mammogram that detected right breast mass measuring 1.6 cm at 3 o'clock position on biopsy revealed high-grade DCIS that was ER/PR positive HER2 negative.  She was presented to the multidisciplinary tumor board and she is here.  The [facility] clinic to discuss treatment plan.  I reviewed her records extensively and collaborated the history with the patient.  SUMMARY OF ONCOLOGIC HISTORY: Oncology History  Ductal carcinoma in situ (DCIS) of right breast  01/13/2023 Initial Diagnosis   Screening mammogram detected right breast mass 1.6 x 0.5 cm at 3 o'clock position biopsy revealed high-grade DCIS solid and cribriform features ER 40%, PR 0%      MEDICAL HISTORY:  Past Medical History:  Diagnosis Date   Anxiety    Cervical dysplasia    Cholelithiasis    10m stone seen on ultrasound in 2009   Colon polyp    Diverticulitis    Migraine headache    Vertigo     SURGICAL HISTORY: Past Surgical History:  Procedure Laterality Date   CHOLECYSTECTOMY     COLPOSCOPY     ECTOPIC PREGNANCY SURGERY     EXTERNAL EAR SURGERY Left    GYNECOLOGIC CRYOSURGERY      SOCIAL HISTORY: Social History   Socioeconomic History   Marital  status: Widowed    Spouse name: Not on file   Number of children: 0   Years of education: Not on file   Highest education level: Not on file  Occupational History   Occupation: [employer], 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  Tobacco Use   Smoking status: Former   Smokeless tobacco: Never   Tobacco comments:    quit in high School  Vaping Use   Vaping Use: Never used  Substance and Sexual Activity   Alcohol use: Yes    Alcohol/week: 2.0 standard drinks of alcohol    Types: 2 Standard drinks or equivalent per week    Comment: weekly   Drug use: No   Sexual activity: Not Currently    Comment: INTERCOURSE AGE 15, SEXUAL PARTNERS  MORE THAN 5  Other Topics Concern   Not on file  Social History Narrative   Lives with boy friend   G4 P0    miscarriage x 3    ectopic x 1    Social Determinants of Health   Financial Resource Strain: Not on file  Food Insecurity: Not on file  Transportation Needs: Not on file  Physical Activity: Not on file  Stress: Not on file  Social Connections: Not on file  Intimate Partner Violence: Not on file    FAMILY HISTORY: Family History  Problem Relation Age of Onset   Colon cancer Mother        in her 463s  Diabetes Sister    Ovarian cancer Cousin        Mat. 1st cousin  CAD Father        14   Breast cancer Neg Hx    Esophageal cancer Neg Hx    Pancreatic cancer Neg Hx     ALLERGIES:  is allergic to diphenhydramine hcl, flagyl [metronidazole], penicillins, tetanus toxoid, clindamycin/lincomycin, and hydrocodone.  MEDICATIONS:  Current Outpatient Medications  Medication Sig Dispense Refill   ALPRAZolam (XANAX) 0.25 MG tablet Take 1 tablet (0.25 mg total) by mouth at bedtime as needed for anxiety. 30 tablet 0   Ascorbic Acid (VITA-C PO) Take 1 tablet by mouth daily.     Multiple Vitamins-Minerals (MULTIMINERAL PLUS) TABS Take 2 tablets by mouth daily.     No current facility-administered medications for this visit.    REVIEW OF SYSTEMS:   Constitutional: Denies  fevers, chills or abnormal night sweats Breast:  Denies any palpable lumps or discharge All other systems were reviewed with the patient and are negative.  PHYSICAL EXAMINATION: ECOG PERFORMANCE STATUS: 0 - Asymptomatic  Vitals:   01/15/23 1304  BP: 136/73  Pulse: 71  Resp: 14  Temp: (!) 97 F (36.1 C)  SpO2: 99%   Filed Weights   01/15/23 1304  Weight: 155 lb 8 oz (70.5 kg)    GENERAL:alert, no distress and comfortable BREAST:20 No palpable nodules in breast. No palpable axillary or supraclavicular lymphadenopathy (exam performed in the presence of a chaperone)   LABORATORY DATA:  I have reviewed the data as listed Lab Results  Component Value Date   WBC 7.4 01/15/2023   HGB 13.8 01/15/2023   HCT 41.4 01/15/2023   MCV 90.0 01/15/2023   PLT 306 01/15/2023   Lab Results  Component Value Date   NA 140 01/15/2023   K 3.8 01/15/2023   CL 105 01/15/2023   CO2 29 01/15/2023    RADIOGRAPHIC STUDIES: I have personally reviewed the radiological reports and agreed with the findings in the report.  ASSESSMENT AND PLAN:  Ductal carcinoma in situ (DCIS) of right breast 01/13/2023:Screening mammogram detected right breast mass 1.6 x 0.5 cm at 3 o'clock position biopsy revealed high-grade DCIS solid and cribriform features ER 40%, PR 0%  Pathology review: I discussed with the patient the difference between DCIS and invasive breast cancer. It is considered a precancerous lesion. DCIS is classified as a 0. It is generally detected through mammograms as calcifications. We discussed the significance of grades and its impact on prognosis. We also discussed the importance of ER and PR receptors and their implications to adjuvant treatment options. Prognosis of DCIS dependence on grade, comedo necrosis. It is anticipated that if not treated, 20-30% of DCIS can develop into invasive breast cancer.  Recommendation: 1. Breast conserving surgery 2. Followed by adjuvant radiation therapy 3.  Followed by antiestrogen therapy with tamoxifen 5 years 4.  Genetics  Tamoxifen counseling: We discussed the risks and benefits of tamoxifen. These include but not limited to insomnia, hot flashes, mood changes, vaginal dryness, and weight gain. Although rare, serious side effects including endometrial cancer, risk of blood clots were also discussed. We strongly believe that the benefits far outweigh the risks. Patient understands these risks and consented to starting treatment. Planned treatment duration is 5 years.  Return to clinic after surgery to discuss the final pathology report and come up with an adjuvant treatment plan.   All questions were answered. The patient knows to call the clinic with any problems, questions or concerns.    Harriette Ohara, MD 01/15/23

## 2023-01-15 NOTE — Assessment & Plan Note (Addendum)
01/13/2023:Screening mammogram detected right breast mass 1.6 x 0.5 cm at 3 o'clock position biopsy revealed high-grade DCIS solid and cribriform features ER 40%, PR 0%  Pathology review: I discussed with the patient the difference between DCIS and invasive breast cancer. It is considered a precancerous lesion. DCIS is classified as a 0. It is generally detected through mammograms as calcifications. We discussed the significance of grades and its impact on prognosis. We also discussed the importance of ER and PR receptors and their implications to adjuvant treatment options. Prognosis of DCIS dependence on grade, comedo necrosis. It is anticipated that if not treated, 20-30% of DCIS can develop into invasive breast cancer.  Recommendation: 1. Breast conserving surgery 2. Followed by adjuvant radiation therapy 3. Followed by antiestrogen therapy with tamoxifen 5 years 4.  Genetics  Tamoxifen counseling: We discussed the risks and benefits of tamoxifen. These include but not limited to insomnia, hot flashes, mood changes, vaginal dryness, and weight gain. Although rare, serious side effects including endometrial cancer, risk of blood clots were also discussed. We strongly believe that the benefits far outweigh the risks. Patient understands these risks and consented to starting treatment. Planned treatment duration is 5 years.  Return to clinic after surgery to discuss the final pathology report and come up with an adjuvant treatment plan.

## 2023-01-15 NOTE — Progress Notes (Unsigned)
REFERRING PROVIDER: Lacinda Axon, MD  PRIMARY PROVIDER:  Terrilyn Saver, NP  PRIMARY REASON FOR VISIT:  Encounter Diagnoses  Name Primary?   Ductal carcinoma in situ (DCIS) of right breast Yes   Family history of colon cancer in mother     HISTORY OF PRESENT ILLNESS:   Ms. Delsignore, a 67 y.o. female, was seen for a [place] cancer genetics consultation at the request of Dr. Lindi Adie due to a personal and family history of cancer.  Ms. Carns presents to clinic today to discuss the possibility of a hereditary predisposition to cancer, to discuss genetic testing, and to further clarify her future cancer risks, as well as potential cancer risks for family members.   CANCER HISTORY:  In January 2024, at the age of 5, 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  RISK FACTORS:  Menarche was at age 31.  First live birth at age 81.  OCP use for approximately 7 years.  Ovaries intact: yes.  Uterus intact: yes.  Menopausal status: postmenopausal.  HRT use: 0 years. Colonoscopy: yes;  2019, 1 tubular adenoma, 1 sessile serrated . Mammogram within the last year: yes. Any excessive radiation exposure in the past: no  Past Medical History:  Diagnosis Date   Anxiety    Cervical dysplasia    Cholelithiasis    69m stone seen on ultrasound in 2009   Colon polyp    Diverticulitis    Migraine headache    Vertigo     Past Surgical History:  Procedure Laterality Date   CHOLECYSTECTOMY     COLPOSCOPY     ECTOPIC PREGNANCY SURGERY     EXTERNAL EAR SURGERY Left    GYNECOLOGIC CRYOSURGERY      Social History   Socioeconomic History   Marital status: Widowed    Spouse name: Not on file   Number of children: 0   Years of education: Not on file   Highest education level: Not on file  Occupational History   Occupation: [employer], 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  Tobacco Use   Smoking status: Former   Smokeless tobacco: Never    Tobacco comments:    quit in high School  Vaping Use   Vaping Use: Never used  Substance and Sexual Activity   Alcohol use: Yes    Alcohol/week: 2.0 standard drinks of alcohol    Types: 2 Standard drinks or equivalent per week    Comment: weekly   Drug use: No   Sexual activity: Not Currently    Comment: INTERCOURSE AGE 30, SEXUAL PARTNERS  MORE THAN 5  Other Topics Concern   Not on file  Social History Narrative   Lives with boy friend   G4 P0    miscarriage x 3    ectopic x 1    Social Determinants of Health   Financial Resource Strain: Not on file  Food Insecurity: Not on file  Transportation Needs: Not on file  Physical Activity: Not on file  Stress: Not on file  Social Connections: Not on file     FAMILY HISTORY:  We obtained a detailed, 4-generation family history.  Significant diagnoses are listed below: Family History  Problem Relation Age of Onset   Colon cancer Mother        in her 487s  Diabetes Sister    Ovarian cancer Cousin        Mat. 1st cousin   CAD Father  27   Breast cancer Neg Hx    Esophageal cancer Neg Hx    Pancreatic cancer Neg Hx      [name]'s mother was diagnosed with colon cancer in her 37s, she is currently 41. Her maternal aunt was diagnosed with an unknown type of cancer. This aunt's daughter (her first cousin) was diagnosed with ovarian cancer at an unknown age. Ms. Mentink is unaware of previous family history of genetic testing for hereditary cancer risks. There is no reported Ashkenazi Jewish ancestry.   GENETIC COUNSELING ASSESSMENT: Ms. Kraker is a 67 y.o. female with a personal and family history of cancer which is somewhat suggestive of a hereditary predisposition to cancer. We, therefore, discussed and recommended the following at today's visit.   DISCUSSION: We discussed that 5 - 10% of cancer is hereditary, with most cases of breast cancer associated with BRCA1/2.  There are other genes that can be  associated with hereditary breast cancer syndromes.  We discussed that testing is beneficial for several reasons including knowing how to follow individuals after completing their treatment, identifying whether potential treatment options would be beneficial, and understanding if other family members could be at risk for cancer and allowing them to undergo genetic testing.   We reviewed the characteristics, features and inheritance patterns of hereditary cancer syndromes. We also discussed genetic testing, including the appropriate family members to test, the process of testing, insurance coverage and turn-around-time for results. We discussed the implications of a negative, positive, carrier and/or variant of uncertain significant result. We recommended [name] pursue genetic testing for a panel that includes genes associated with breast, colon, and ovarian cancer.   Ms. Fader elected to have [facility] Common Cancer Panel. The Common Hereditary Cancers Panel offered by [facility] includes sequencing and/or deletion duplication testing of the following 48 genes: APC, ATM, AXIN2, BAP1, BARD1, BMPR1A, BRCA1, BRCA2, BRIP1, CDH1, CDK4, CDKN2A (p14ARF and p16INK4a only), CHEK2, CTNNA1, DICER1, EPCAM (Deletion/duplication testing only), FH, GREM1 (promoter region duplication testing only), HOXB13, KIT, MBD4, MEN1, MLH1, MSH2, MSH3, MSH6, MUTYH, NF1, NHTL1, PALB2, PDGFRA, PMS2, POLD1, POLE, PTEN, RAD51C, RAD51D, SDHA, SDHB, SDHC, SDHD, SMAD4, SMARCA4. STK11, TP53, TSC1, TSC2, and VHL.  Based on [name]'s personal and family history of cancer, she meets medical criteria for genetic testing. Despite that she meets criteria, she may still have an out of pocket cost. We discussed that if her out of pocket cost for testing is over $100, the laboratory will call and confirm whether she wants to proceed with testing.  If the out of pocket cost of testing is less than $100 she will be billed by the  genetic testing laboratory.   PLAN: After considering the risks, benefits, and limitations, [name] provided informed consent to pursue genetic testing and the blood sample was sent to [facility] for the Breast Cancer STAT panel with reflex to the Common Cancer Panel. Results should be available within approximately 2-3 weeks' time, at which point they will be disclosed by telephone to [name], as will any additional recommendations warranted by these results. Ms. Debellis will receive a summary of her genetic counseling visit and a copy of her results once available. This information will also be available in Epic.   Ms. Pfahler questions were answered to her satisfaction today. Our contact information was provided should additional questions or concerns arise. Thank you for the referral and allowing [place] to share in the care of your patient.   Lucille Passy, MS, [facility] Genetic  Counselor [email or web address] (P) [phone]  The patient was seen for a total of 20 minutes in face-to-face genetic counseling.  The patient brought her friend. Drs. Lindi Adie and/or Burr Medico were available to discuss this case as needed.   _______________________________________________________________________ For Office Staff:  Number of people involved in session: 2 Was an Intern/ student involved with case: no

## 2023-01-16 ENCOUNTER — Encounter: Payer: Self-pay | Admitting: Genetic Counselor

## 2023-01-16 ENCOUNTER — Encounter: Payer: Self-pay | Admitting: General Practice

## 2023-01-16 DIAGNOSIS — Z800 Family history of malignant neoplasm of digestive organs: Secondary | ICD-10-CM | POA: Insufficient documentation

## 2023-01-16 NOTE — Progress Notes (Signed)
[place] Psychosocial Distress Screening Spiritual Care  Met with Debra Norton by phone following Breast Multidisciplinary Clinic to introduce [place] team/resources, reviewing distress screen per protocol.  The patient scored a 3 on the Psychosocial Distress Thermometer which indicates mild distress. Also assessed for distress and other psychosocial needs.      01/16/2023   11:57 AM  ONCBCN DISTRESS SCREENING  Screening Type Initial Screening  Distress experienced in past week (1-10) 3  Emotional problem type Nervousness/Anxiety  Physical Problem type Sleep/insomnia  Referral to support programs Yes    Chaplain and patient discussed common feelings and emotions when being diagnosed with cancer, and the importance of support during treatment.  Chaplain informed patient of the support team and support services at [facility].  Chaplain provided contact information and encouraged patient to call with any questions or concerns.  [name] reports good support from a core group of close friends who helped her through extensive grief a couple years ago. Her family (in [place]) and work are supportive. She reports decreased anxiety now that she has met her care team and has a care plan.  Follow up needed: Yes.  Adding [name] to support programming mailing list. Also plan to follow up by phone in ca two weeks for emotional wellness check-in.   [place], [place], [facility] Pager [phone] Voicemail [phone]

## 2023-01-17 ENCOUNTER — Other Ambulatory Visit: Payer: Self-pay | Admitting: *Deleted

## 2023-01-17 DIAGNOSIS — D0511 Intraductal carcinoma in situ of right breast: Secondary | ICD-10-CM

## 2023-01-22 ENCOUNTER — Other Ambulatory Visit: Payer: Self-pay | Admitting: Radiation Oncology

## 2023-01-22 ENCOUNTER — Inpatient Hospital Stay
Admission: RE | Admit: 2023-01-22 | Discharge: 2023-01-22 | Disposition: A | Discharge status: Home / Self Care | Payer: Self-pay | Source: Ambulatory Visit | Attending: Radiation Oncology | Admitting: Radiation Oncology

## 2023-01-22 ENCOUNTER — Ambulatory Visit
Admission: RE | Admit: 2023-01-22 | Discharge: 2023-01-22 | Disposition: A | Discharge status: Home / Self Care | Payer: Self-pay | Source: Ambulatory Visit | Attending: Radiation Oncology | Admitting: Radiation Oncology

## 2023-01-22 DIAGNOSIS — D0511 Intraductal carcinoma in situ of right breast: Secondary | ICD-10-CM

## 2023-01-23 ENCOUNTER — Encounter: Payer: Self-pay | Admitting: Family Medicine

## 2023-01-23 NOTE — Progress Notes (Signed)
Surgical Instructions    Your procedure is scheduled on Thursday, 01/30/23.  Report to [facility] Main Entrance "A" at 7:00 A.M., then check in with the Admitting office.  Call this number if you have problems the morning of surgery:  [phone]   If you have any questions prior to your surgery date call [phone]: Open Monday-Friday 8am-4pm If you experience any cold or flu symptoms such as cough, fever, chills, shortness of breath, etc. between now and your scheduled surgery, please notify us at the above number     Remember:  Do not eat after midnight the night before your surgery  You may drink clear liquids until 6:00am the morning of your surgery.   Clear liquids allowed are: Water, Non-Citrus Juices (without pulp), Carbonated Beverages, Clear Tea, Black Coffee ONLY (NO MILK, CREAM OR POWDERED CREAMER of any kind), and Gatorade  Patient Instructions  The night before surgery:  No food after midnight. ONLY clear liquids after midnight  The day of surgery (if you do NOT have diabetes):  Drink ONE (1) Pre-Surgery Clear Ensure by 6:00am the morning of surgery. Drink in one sitting. Do not sip.  This drink was given to you during your hospital  pre-op appointment visit. Nothing else to drink after completing the  Pre-Surgery Clear Ensure.          If you have questions, please contact your surgeon's office.     Take these medicines the morning of surgery with A SIP OF WATER:  traMADol (ULTRAM) if needed  As of today, STOP taking any Aspirin (unless otherwise instructed by your surgeon) Aleve, Naproxen, Ibuprofen, Motrin, Advil, Goody's, BC's, all herbal medications, fish oil, and all vitamins.           Do not wear jewelry or makeup. Do not wear lotions, powders, perfumes or deodorant. Do not shave 48 hours prior to surgery.   Do not bring valuables to the hospital. Do not wear nail polish, gel polish, artificial nails, or any other type of covering on natural nails  (fingers and toes) If you have artificial nails or gel coating that need to be removed by a nail salon, please have this removed prior to surgery. Artificial nails or gel coating may interfere with anesthesia's ability to adequately monitor your vital signs.  [facility] is not responsible for any belongings or valuables.    Do NOT Smoke (Tobacco/Vaping)  24 hours prior to your procedure  If you use a CPAP at night, you may bring your mask for your overnight stay.   Contacts, glasses, hearing aids, dentures or partials may not be worn into surgery, please bring cases for these belongings   For patients admitted to the hospital, discharge time will be determined by your treatment team.   Patients discharged the day of surgery will not be allowed to drive home, and someone needs to stay with them for 24 hours.   SURGICAL WAITING ROOM VISITATION Patients having surgery or a procedure may have no more than 2 support people in the waiting area - these visitors may rotate.   Children under the age of 70 must have an adult with them who is not the patient. If the patient needs to stay at the hospital during part of their recovery, the visitor guidelines for inpatient rooms apply. Pre-op nurse will coordinate an appropriate time for 1 support person to accompany patient in pre-op.  This support person may not rotate.   Please refer to [email or web address] for the visitor  guidelines for Inpatients (after your surgery is over and you are in a regular room).    Special instructions:    Oral Hygiene is also important to reduce your risk of infection.  Remember - BRUSH YOUR TEETH THE MORNING OF SURGERY WITH YOUR REGULAR TOOTHPASTE   [facility]- Preparing For Surgery  Before surgery, you can play an important role. Because skin is not sterile, your skin needs to be as free of germs as possible. You can reduce the number of germs on your skin by  washing with CHG (chlorahexidine gluconate) Soap before surgery.  CHG is an antiseptic cleaner which kills germs and bonds with the skin to continue killing germs even after washing.     Please do not use if you have an allergy to CHG or antibacterial soaps. If your skin becomes reddened/irritated stop using the CHG.  Do not shave (including legs and underarms) for at least 48 hours prior to first CHG shower. It is OK to shave your face.  Please follow these instructions carefully.     Shower the NIGHT BEFORE SURGERY and the MORNING OF SURGERY with CHG Soap.   If you chose to wash your hair, wash your hair first as usual with your normal shampoo. After you shampoo, rinse your hair and body thoroughly to remove the shampoo.  Then [employer] and genitals (private parts) with your normal soap and rinse thoroughly to remove soap.  After that Use CHG Soap as you would any other liquid soap. You can apply CHG directly to the skin and wash gently with a scrungie or a clean washcloth.   Apply the CHG Soap to your body ONLY FROM THE NECK DOWN.  Do not use on open wounds or open sores. Avoid contact with your eyes, ears, mouth and genitals (private parts). Wash Face and genitals (private parts)  with your normal soap.   Wash thoroughly, paying special attention to the area where your surgery will be performed.  Thoroughly rinse your body with warm water from the neck down.  DO NOT shower/wash with your normal soap after using and rinsing off the CHG Soap.  Pat yourself dry with a CLEAN TOWEL.  Wear CLEAN PAJAMAS to bed the night before surgery  Place CLEAN SHEETS on your bed the night before your surgery  DO NOT SLEEP WITH PETS.   Day of Surgery: Take a shower with CHG soap. Wear Clean/Comfortable clothing the morning of surgery Do not apply any deodorants/lotions.   Remember to brush your teeth WITH YOUR REGULAR TOOTHPASTE.    If you received a COVID test during your pre-op visit, it is  requested that you wear a mask when out in public, stay away from anyone that may not be feeling well, and notify your surgeon if you develop symptoms. If you have been in contact with anyone that has tested positive in the last 10 days, please notify your surgeon.    Please read over the following fact sheets that you were given.

## 2023-01-24 ENCOUNTER — Telehealth: Payer: Self-pay | Admitting: *Deleted

## 2023-01-24 ENCOUNTER — Encounter (HOSPITAL_COMMUNITY)
Admission: RE | Admit: 2023-01-24 | Discharge: 2023-01-24 | Disposition: A | Discharge status: Home / Self Care | Payer: Medicare HMO | Source: Ambulatory Visit | Attending: General Surgery | Admitting: General Surgery

## 2023-01-24 ENCOUNTER — Encounter: Payer: Self-pay | Admitting: *Deleted

## 2023-01-24 ENCOUNTER — Encounter (HOSPITAL_COMMUNITY): Payer: Self-pay

## 2023-01-24 VITALS — BP 122/65 | HR 103 | Temp 98.2°F | Resp 17 | Ht 62.0 in | Wt 152.0 lb

## 2023-01-24 DIAGNOSIS — Z01812 Encounter for preprocedural laboratory examination: Secondary | ICD-10-CM | POA: Diagnosis not present

## 2023-01-24 DIAGNOSIS — Z01818 Encounter for other preprocedural examination: Secondary | ICD-10-CM

## 2023-01-24 DIAGNOSIS — D649 Anemia, unspecified: Secondary | ICD-10-CM | POA: Diagnosis not present

## 2023-01-24 DIAGNOSIS — D539 Nutritional anemia, unspecified: Secondary | ICD-10-CM

## 2023-01-24 HISTORY — DX: Depression, unspecified: F32.A

## 2023-01-24 HISTORY — DX: Other complications of anesthesia, initial encounter: T88.59XA

## 2023-01-24 HISTORY — DX: Malignant (primary) neoplasm, unspecified: C80.1

## 2023-01-24 LAB — CBC
HCT: 39.6 % (ref 36.0–46.0)
Hemoglobin: 13.4 g/dL (ref 12.0–15.0)
MCH: 30.5 pg (ref 26.0–34.0)
MCHC: 33.8 g/dL (ref 30.0–36.0)
MCV: 90 fL (ref 80.0–100.0)
Platelets: 304 10*3/uL (ref 150–400)
RBC: 4.4 MIL/uL (ref 3.87–5.11)
RDW: 12.8 % (ref 11.5–15.5)
WBC: 9 10*3/uL (ref 4.0–10.5)
nRBC: 0 % (ref 0.0–0.2)

## 2023-01-24 LAB — BASIC METABOLIC PANEL
Anion gap: 12 (ref 5–15)
BUN: 14 mg/dL (ref 8–23)
CO2: 24 mmol/L (ref 22–32)
Calcium: 9.2 mg/dL (ref 8.9–10.3)
Chloride: 103 mmol/L (ref 98–111)
Creatinine, Ser: 0.83 mg/dL (ref 0.44–1.00)
GFR, Estimated: >60 mL/min (ref >60)
Glucose, Bld: 103 mg/dL — ABNORMAL HIGH (ref 70–99)
Potassium: 3.8 mmol/L (ref 3.5–5.1)
Sodium: 139 mmol/L (ref 135–145)

## 2023-01-24 NOTE — Progress Notes (Signed)
PCP - Dr. Larose Kells, usually sees, Caleen Jobs, NP  Cardiologist - saw Dr. Bettina Gavia for cardiac assessment  calcium score was 0, `no need for follow up.  Chest x-ray - NA  EKG - NA  Stress Test - no  ECHO -   Cardiac Cath - no  AICD-no PM-no LOOP-no    Sleep Study - no CPAP - no  LABS-CBC, Bmp  ASA-no  ERAS-no  HA1C-na Fasting Blood Sugar - na Checks Blood Sugar ____0_ times a day  Anesthesia-  Pt denies having chest pain, sob, or fever at this time. All instructions explained to the pt, with a verbal understanding of the material. Pt agrees to go over the instructions while at home for a better understanding. Pt also instructed to self quarantine after being tested for COVID-19. The opportunity to ask questions was provided.

## 2023-01-24 NOTE — Telephone Encounter (Signed)
Spoke with patient to follow up from [facility] 1/31 and assess navigation needs.  Patient denies any questions or concerns at this time. Encouraged her to call should anything arise. Patient verbalized understanding.

## 2023-01-27 ENCOUNTER — Encounter: Payer: Self-pay | Admitting: Genetic Counselor

## 2023-01-27 ENCOUNTER — Telehealth: Payer: Self-pay | Admitting: Genetic Counselor

## 2023-01-27 DIAGNOSIS — Z1379 Encounter for other screening for genetic and chromosomal anomalies: Secondary | ICD-10-CM | POA: Insufficient documentation

## 2023-01-27 NOTE — Telephone Encounter (Signed)
I contacted [name] to discuss her genetic testing results. No pathogenic variants were identified in the 48 genes analyzed. Detailed clinic note to follow.  The test report has been scanned into EPIC and is located under the Molecular Pathology section of the Results Review tab.  A portion of the result report is included below for reference.   Lucille Passy, MS, [facility] Genetic Counselor [email or web address] (P) [phone]

## 2023-01-29 ENCOUNTER — Encounter: Payer: Self-pay | Admitting: *Deleted

## 2023-01-29 DIAGNOSIS — R928 Other abnormal and inconclusive findings on diagnostic imaging of breast: Secondary | ICD-10-CM | POA: Diagnosis not present

## 2023-01-29 DIAGNOSIS — C50911 Malignant neoplasm of unspecified site of right female breast: Secondary | ICD-10-CM | POA: Diagnosis not present

## 2023-01-29 NOTE — Anesthesia Preprocedure Evaluation (Signed)
Anesthesia Evaluation  Patient identified by MRN, date of birth, ID band Patient awake    Reviewed: Allergy & Precautions, NPO status , Patient's Chart, lab work & pertinent test results  Airway Mallampati: II  TM Distance: >3 FB Neck ROM: Full    Dental no notable dental hx. (+) Partial Upper, Dental Advisory Given   Pulmonary sleep apnea , former smoker   Pulmonary exam normal breath sounds clear to auscultation       Cardiovascular Normal cardiovascular exam Rhythm:Regular Rate:Normal     Neuro/Psych  Headaches  Anxiety Depression       GI/Hepatic ,GERD  ,,Lab Results      Component                Value               Date                      CREATININE               0.83                01/24/2023                BUN                      14                  01/24/2023                NA                       139                 01/24/2023                K                        3.8                 01/24/2023                CL                       103                 01/24/2023                CO2                      24                  01/24/2023              Endo/Other    Renal/GU Lab Results      Component                Value               Date                      CREATININE               0.83                01/24/2023  BUN                      14                  01/24/2023                NA                       139                 01/24/2023                K                        3.8                 01/24/2023                CL                       103                 01/24/2023                CO2                      24                  01/24/2023                Musculoskeletal   Abdominal   Peds  Hematology Lab Results      Component                Value               Date                      WBC                      9.0                 01/24/2023                HGB                       13.4                01/24/2023                HCT                      39.6                01/24/2023                MCV                      90.0                01/24/2023                PLT                      304  01/24/2023              Anesthesia Other Findings All: PCN, Diphenhydramine,flagyl, clindamycin, hydrocodone  Reproductive/Obstetrics negative OB ROS                             Anesthesia Physical Anesthesia Plan  ASA: 2  Anesthesia Plan: General   Post-op Pain Management: Precedex and Tylenol PO (pre-op)*   Induction: Intravenous  PONV Risk Score and Plan: Treatment may vary due to age or medical condition, Midazolam, Ondansetron, Propofol infusion and TIVA  Airway Management Planned: LMA  Additional Equipment: None  Intra-op Plan:   Post-operative Plan: Extubation in OR  Informed Consent: I have reviewed the patients History and Physical, chart, labs and discussed the procedure including the risks, benefits and alternatives for the proposed anesthesia with the patient or authorized representative who has indicated his/her understanding and acceptance.     Dental advisory given  Plan Discussed with: CRNA, Anesthesiologist and Surgeon  Anesthesia Plan Comments:         Anesthesia Quick Evaluation

## 2023-01-30 ENCOUNTER — Encounter (HOSPITAL_COMMUNITY): Payer: Self-pay | Admitting: General Surgery

## 2023-01-30 ENCOUNTER — Encounter (HOSPITAL_COMMUNITY)
Admission: RE | Disposition: A | Discharge status: Home / Self Care | Payer: Self-pay | Source: Home / Self Care | Attending: General Surgery

## 2023-01-30 ENCOUNTER — Other Ambulatory Visit: Payer: Self-pay

## 2023-01-30 ENCOUNTER — Ambulatory Visit (HOSPITAL_COMMUNITY)
Admission: RE | Admit: 2023-01-30 | Discharge: 2023-01-30 | Disposition: A | Discharge status: Home / Self Care | Payer: Medicare HMO | Attending: General Surgery | Admitting: General Surgery

## 2023-01-30 ENCOUNTER — Ambulatory Visit (HOSPITAL_BASED_OUTPATIENT_CLINIC_OR_DEPARTMENT_OTHER): Payer: Medicare HMO | Admitting: Anesthesiology

## 2023-01-30 ENCOUNTER — Ambulatory Visit (HOSPITAL_COMMUNITY): Payer: Medicare HMO | Admitting: Anesthesiology

## 2023-01-30 DIAGNOSIS — D0511 Intraductal carcinoma in situ of right breast: Secondary | ICD-10-CM | POA: Diagnosis present

## 2023-01-30 DIAGNOSIS — C50911 Malignant neoplasm of unspecified site of right female breast: Secondary | ICD-10-CM

## 2023-01-30 DIAGNOSIS — R69 Illness, unspecified: Secondary | ICD-10-CM | POA: Diagnosis not present

## 2023-01-30 DIAGNOSIS — Z170 Estrogen receptor positive status [ER+]: Secondary | ICD-10-CM | POA: Diagnosis not present

## 2023-01-30 DIAGNOSIS — F418 Other specified anxiety disorders: Secondary | ICD-10-CM | POA: Insufficient documentation

## 2023-01-30 DIAGNOSIS — C50211 Malignant neoplasm of upper-inner quadrant of right female breast: Secondary | ICD-10-CM | POA: Diagnosis not present

## 2023-01-30 DIAGNOSIS — Z87891 Personal history of nicotine dependence: Secondary | ICD-10-CM | POA: Insufficient documentation

## 2023-01-30 DIAGNOSIS — Z800 Family history of malignant neoplasm of digestive organs: Secondary | ICD-10-CM | POA: Diagnosis not present

## 2023-01-30 HISTORY — PX: BREAST LUMPECTOMY WITH RADIOACTIVE SEED LOCALIZATION: SHX6424

## 2023-01-30 SURGERY — BREAST LUMPECTOMY WITH RADIOACTIVE SEED LOCALIZATION
Anesthesia: General | Site: Breast | Laterality: Right

## 2023-01-30 MED ORDER — ONDANSETRON HCL 8 MG PO TABS: 4.0000 mg | ORAL_TABLET | Freq: Three times a day (TID) | ORAL | 1 refills | Status: DC | PRN

## 2023-01-30 MED ORDER — OXYCODONE HCL 5 MG/5ML PO SOLN: 5.0000 mg | Freq: Once | ORAL | Status: DC | PRN

## 2023-01-30 MED ORDER — PROPOFOL 500 MG/50ML IV EMUL
INTRAVENOUS | Status: DC | PRN
  Administered 2023-01-30: 100 ug/kg/min via INTRAVENOUS

## 2023-01-30 MED ORDER — 0.9 % SODIUM CHLORIDE (POUR BTL) OPTIME
TOPICAL | Status: DC | PRN
  Administered 2023-01-30: 1000 mL

## 2023-01-30 MED ORDER — CHLORHEXIDINE GLUCONATE CLOTH 2 % EX PADS: 6.0000 | MEDICATED_PAD | Freq: Once | CUTANEOUS | Status: DC

## 2023-01-30 MED ORDER — EPHEDRINE SULFATE-NACL 50-0.9 MG/10ML-% IV SOSY
PREFILLED_SYRINGE | INTRAVENOUS | Status: DC | PRN
  Administered 2023-01-30: 5 mg via INTRAVENOUS

## 2023-01-30 MED ORDER — ACETAMINOPHEN 500 MG PO TABS
1000.0000 mg | ORAL_TABLET | ORAL | Status: AC
  Administered 2023-01-30: 1000 mg via ORAL
  Filled 2023-01-30: qty 2

## 2023-01-30 MED ORDER — LIDOCAINE 2% (20 MG/ML) 5 ML SYRINGE
INTRAMUSCULAR | Status: DC | PRN
  Administered 2023-01-30: 100 mg via INTRAVENOUS

## 2023-01-30 MED ORDER — ONDANSETRON HCL 4 MG/2ML IJ SOLN
INTRAMUSCULAR | Status: DC | PRN
  Administered 2023-01-30: 4 mg via INTRAVENOUS

## 2023-01-30 MED ORDER — MIDAZOLAM HCL 2 MG/2ML IJ SOLN
INTRAMUSCULAR | Status: AC
  Filled 2023-01-30: qty 2

## 2023-01-30 MED ORDER — DEXMEDETOMIDINE HCL IN NACL 80 MCG/20ML IV SOLN
INTRAVENOUS | Status: DC | PRN
  Administered 2023-01-30: 6 ug via BUCCAL

## 2023-01-30 MED ORDER — LACTATED RINGERS IV SOLN: INTRAVENOUS | Status: DC

## 2023-01-30 MED ORDER — FENTANYL CITRATE (PF) 250 MCG/5ML IJ SOLN
INTRAMUSCULAR | Status: DC | PRN
  Administered 2023-01-30: 50 ug via INTRAVENOUS

## 2023-01-30 MED ORDER — BUPIVACAINE HCL (PF) 0.25 % IJ SOLN
INTRAMUSCULAR | Status: AC
  Filled 2023-01-30: qty 30

## 2023-01-30 MED ORDER — HYDROMORPHONE HCL 1 MG/ML IJ SOLN
INTRAMUSCULAR | Status: AC
  Filled 2023-01-30: qty 1

## 2023-01-30 MED ORDER — LIDOCAINE-EPINEPHRINE 1 %-1:100000 IJ SOLN
INTRAMUSCULAR | Status: AC
  Filled 2023-01-30: qty 1

## 2023-01-30 MED ORDER — CIPROFLOXACIN IN D5W 400 MG/200ML IV SOLN
400.0000 mg | INTRAVENOUS | Status: AC
  Administered 2023-01-30: 400 mg via INTRAVENOUS
  Filled 2023-01-30: qty 200

## 2023-01-30 MED ORDER — DEXAMETHASONE SODIUM PHOSPHATE 10 MG/ML IJ SOLN
INTRAMUSCULAR | Status: DC | PRN
  Administered 2023-01-30: 10 mg via INTRAVENOUS

## 2023-01-30 MED ORDER — LIDOCAINE-EPINEPHRINE 1 %-1:100000 IJ SOLN
INTRAMUSCULAR | Status: DC | PRN
  Administered 2023-01-30: 40 mL via SURGICAL_CAVITY

## 2023-01-30 MED ORDER — HYDROMORPHONE HCL 1 MG/ML IJ SOLN
0.2500 mg | INTRAMUSCULAR | Status: DC | PRN
  Administered 2023-01-30 (×2): 0.25 mg via INTRAVENOUS

## 2023-01-30 MED ORDER — ONDANSETRON HCL 4 MG/2ML IJ SOLN: 4.0000 mg | Freq: Once | INTRAMUSCULAR | Status: AC | PRN

## 2023-01-30 MED ORDER — MIDAZOLAM HCL 2 MG/2ML IJ SOLN
INTRAMUSCULAR | Status: DC | PRN
  Administered 2023-01-30: 2 mg via INTRAVENOUS

## 2023-01-30 MED ORDER — KETOROLAC TROMETHAMINE 30 MG/ML IJ SOLN
15.0000 mg | Freq: Once | INTRAMUSCULAR | Status: AC | PRN
  Administered 2023-01-30: 15 mg via INTRAVENOUS

## 2023-01-30 MED ORDER — FENTANYL CITRATE (PF) 250 MCG/5ML IJ SOLN
INTRAMUSCULAR | Status: AC
  Filled 2023-01-30: qty 5

## 2023-01-30 MED ORDER — ENSURE PRE-SURGERY PO LIQD: 296.0000 mL | Freq: Once | ORAL | Status: DC

## 2023-01-30 MED ORDER — ORAL CARE MOUTH RINSE: 15.0000 mL | Freq: Once | OROMUCOSAL | Status: AC

## 2023-01-30 MED ORDER — OXYCODONE HCL 5 MG PO TABS: 5.0000 mg | ORAL_TABLET | Freq: Four times a day (QID) | ORAL | 0 refills | Status: DC | PRN

## 2023-01-30 MED ORDER — ONDANSETRON HCL 4 MG/2ML IJ SOLN
INTRAMUSCULAR | Status: AC
  Administered 2023-01-30: 4 mg via INTRAVENOUS
  Filled 2023-01-30: qty 2

## 2023-01-30 MED ORDER — OXYCODONE HCL 5 MG PO TABS: 5.0000 mg | ORAL_TABLET | Freq: Once | ORAL | Status: DC | PRN

## 2023-01-30 MED ORDER — CHLORHEXIDINE GLUCONATE 0.12 % MT SOLN
15.0000 mL | Freq: Once | OROMUCOSAL | Status: AC
  Administered 2023-01-30: 15 mL via OROMUCOSAL
  Filled 2023-01-30: qty 15

## 2023-01-30 MED ORDER — PHENYLEPHRINE 80 MCG/ML (10ML) SYRINGE FOR IV PUSH (FOR BLOOD PRESSURE SUPPORT)
PREFILLED_SYRINGE | INTRAVENOUS | Status: DC | PRN
  Administered 2023-01-30 (×5): 160 ug via INTRAVENOUS

## 2023-01-30 MED ORDER — PROPOFOL 10 MG/ML IV BOLUS
INTRAVENOUS | Status: DC | PRN
  Administered 2023-01-30: 160 mg via INTRAVENOUS

## 2023-01-30 MED ORDER — KETOROLAC TROMETHAMINE 30 MG/ML IJ SOLN
INTRAMUSCULAR | Status: AC
  Filled 2023-01-30: qty 1

## 2023-01-30 SURGICAL SUPPLY — 42 items
BAG COUNTER SPONGE SURGICOUNT (BAG) ×1 IMPLANT
BINDER BREAST LRG (GAUZE/BANDAGES/DRESSINGS) IMPLANT
BINDER BREAST XLRG (GAUZE/BANDAGES/DRESSINGS) IMPLANT
BLADE SURG 10 STRL SS (BLADE) ×1 IMPLANT
CANISTER SUCT 3000ML PPV (MISCELLANEOUS) IMPLANT
CHLORAPREP W/TINT 26 (MISCELLANEOUS) ×1 IMPLANT
CLIP VESOCCLUDE LG 6/CT (CLIP) ×1 IMPLANT
COVER PROBE W GEL 5X96 (DRAPES) ×1 IMPLANT
COVER SURGICAL LIGHT HANDLE (MISCELLANEOUS) ×1 IMPLANT
DERMABOND ADVANCED .7 DNX12 (GAUZE/BANDAGES/DRESSINGS) ×1 IMPLANT
DERMABOND ADVANCED .7 DNX6 (GAUZE/BANDAGES/DRESSINGS) IMPLANT
DEVICE DUBIN SPECIMEN MAMMOGRA (MISCELLANEOUS) ×1 IMPLANT
DRAPE CHEST BREAST 15X10 FENES (DRAPES) ×1 IMPLANT
ELECT COATED BLADE 2.86 ST (ELECTRODE) ×1 IMPLANT
ELECT REM PT RETURN 9FT ADLT (ELECTROSURGICAL) ×1
ELECTRODE REM PT RTRN 9FT ADLT (ELECTROSURGICAL) ×1 IMPLANT
GAUZE PAD ABD 7.5X8 STRL (GAUZE/BANDAGES/DRESSINGS) IMPLANT
GAUZE PAD ABD 8X10 STRL (GAUZE/BANDAGES/DRESSINGS) ×1 IMPLANT
GAUZE SPONGE 4X4 12PLY STRL LF (GAUZE/BANDAGES/DRESSINGS) ×1 IMPLANT
GLOVE BIO SURGEON STRL SZ 6 (GLOVE) ×1 IMPLANT
GLOVE INDICATOR 6.5 STRL GRN (GLOVE) ×1 IMPLANT
GOWN STRL REUS W/ TWL LRG LVL3 (GOWN DISPOSABLE) ×1 IMPLANT
GOWN STRL REUS W/ TWL XL LVL3 (GOWN DISPOSABLE) ×1 IMPLANT
GOWN STRL REUS W/TWL LRG LVL3 (GOWN DISPOSABLE) ×1
GOWN STRL REUS W/TWL XL LVL3 (GOWN DISPOSABLE) ×1
KIT BASIN OR (CUSTOM PROCEDURE TRAY) ×1 IMPLANT
KIT MARKER MARGIN INK (KITS) ×1 IMPLANT
LIGHT WAVEGUIDE WIDE FLAT (MISCELLANEOUS) IMPLANT
NDL HYPO 25GX1X1/2 BEV (NEEDLE) ×1 IMPLANT
NEEDLE HYPO 25GX1X1/2 BEV (NEEDLE) ×1 IMPLANT
NS IRRIG 1000ML POUR BTL (IV SOLUTION) IMPLANT
PACK GENERAL/GYN (CUSTOM PROCEDURE TRAY) ×1 IMPLANT
STRIP CLOSURE SKIN 1/2X4 (GAUZE/BANDAGES/DRESSINGS) ×1 IMPLANT
SUT MNCRL AB 4-0 PS2 18 (SUTURE) ×1 IMPLANT
SUT SILK 2 0 SH (SUTURE) IMPLANT
SUT VIC AB 2-0 SH 27 (SUTURE) ×1
SUT VIC AB 2-0 SH 27XBRD (SUTURE) ×1 IMPLANT
SUT VIC AB 3-0 SH 27 (SUTURE) ×1
SUT VIC AB 3-0 SH 27X BRD (SUTURE) ×1 IMPLANT
SYR CONTROL 10ML LL (SYRINGE) ×1 IMPLANT
TOWEL GREEN STERILE (TOWEL DISPOSABLE) ×1 IMPLANT
TOWEL GREEN STERILE FF (TOWEL DISPOSABLE) ×1 IMPLANT

## 2023-01-30 NOTE — Discharge Instructions (Addendum)
[facility] Office Phone Number [phone]  BREAST BIOPSY/ PARTIAL MASTECTOMY: POST OP INSTRUCTIONS  Always review your discharge instruction sheet given to you by the facility where your surgery was performed.  IF YOU HAVE DISABILITY OR FAMILY LEAVE FORMS, YOU MUST BRING THEM TO THE OFFICE FOR PROCESSING.  DO NOT GIVE THEM TO YOUR DOCTOR.  Take 2 tylenol (acetominophen) three times a day for 3 days.  If you still have pain, add ibuprofen with food in between if able to take this (if you have kidney issues or stomach issues, do not take ibuprofen).  If both of those are not enough, add the narcotic pain pill.  If you find you are needing a lot of this overnight after surgery, call the next morning for a refill.    Prescriptions will not be filled after 5pm or on week-ends. Take your usually prescribed medications unless otherwise directed You should eat very light the first 24 hours after surgery, such as soup, crackers, pudding, etc.  Resume your normal diet the day after surgery. Most patients will experience some swelling and bruising in the breast.  Ice packs and a good support bra will help.  Swelling and bruising can take several days to resolve.  It is common to experience some constipation if taking pain medication after surgery.  Increasing fluid intake and taking a stool softener will usually help or prevent this problem from occurring.  A mild laxative (Milk of Magnesia or Miralax) should be taken according to package directions if there are no bowel movements after 48 hours. Unless discharge instructions indicate otherwise, you may remove your bandages 48 hours after surgery, and you may shower at that time.  You may have steri-strips (small skin tapes) in place directly over the incision.  These strips should be left on the skin at least for for 7-10 days.    ACTIVITIES:  You may resume regular daily activities (gradually increasing) beginning the next day.  Wearing a  good support bra or sports bra (or the breast binder) minimizes pain and swelling.  You may have sexual intercourse when it is comfortable. No heavy lifting for 1-2 weeks (not over around 10 pounds).  You may drive when you no longer are taking prescription pain medication, you can comfortably wear a seatbelt, and you can safely maneuver your car and apply brakes. RETURN TO WORK:  __________3-14 days depending on job. _______________ You should see your doctor in the office for a follow-up appointment approximately two weeks after your surgery.  Your doctor's nurse will typically make your follow-up appointment when she calls you with your pathology report.  Expect your pathology report 3-4 business days after your surgery.  You may call to check if you do not hear from us after three days.   WHEN TO CALL YOUR DOCTOR: Fever over 101.0 Nausea and/or vomiting. Extreme swelling or bruising. Continued bleeding from incision. Increased pain, redness, or drainage from the incision.  The clinic staff is available to answer your questions during regular business hours.  Please don't hesitate to call and ask to speak to one of the nurses for clinical concerns.  If you have a medical emergency, go to the nearest emergency room or call 911.  A surgeon from [facility] is always on call at the hospital.  For further questions, please visit [email or web address]   

## 2023-01-30 NOTE — Transfer of Care (Signed)
Immediate Anesthesia Transfer of Care Note  Patient: Debra Norton  Procedure(s) Performed: RIGHT BREAST LUMPECTOMY WITH RADIOACTIVE SEED LOCALIZATION (Right: Breast)  Patient Location: PACU  Anesthesia Type:General  Level of Consciousness: drowsy and patient cooperative  Airway & Oxygen Therapy: Patient Spontanous Breathing and Patient connected to nasal cannula oxygen  Post-op Assessment: Report given to RN, Post -op Vital signs reviewed and stable, and Patient moving all extremities X 4  Post vital signs: Reviewed and stable  Last Vitals:  Vitals Value Taken Time  BP 91/40 01/30/23 1025  Temp    Pulse 73 01/30/23 1029  Resp 9 01/30/23 1029  SpO2 98 % 01/30/23 1029  Vitals shown include unvalidated device data.  Last Pain:  Vitals:   01/30/23 0812  TempSrc:   PainSc: 0-No pain         Complications: No notable events documented. Dr. Ermalene Postin aware of BP. Treated with neosynephrine bolus.

## 2023-01-30 NOTE — H&P (Signed)
REFERRING PHYSICIAN: Robbi Garter, MD  PROVIDER: Georgianne Fick, MD  Care Team: Patient Care Team: Georgianne Fick, MD as Consulting Provider (Surgical Oncology) Rulon Eisenmenger, MD (Hematology and Oncology) Marye Round, MD (Radiation Oncology)   MRN: [id number] DOB: 1956-09-28 DATE OF ENCOUNTER: 01/15/2023  Subjective   Chief Complaint: Breast Cancer  History of Present Illness: Debra Norton is a 67 y.o. female who is seen today as an office consultation at the request of Dr. Lindi Adie for evaluation of Breast Cancer  Pt presents with a new diagnosis of right breast cancer 12/2022. She had a screening detected right breast mass. Diagnostic imaging was performed, showing a 1.6 cm mass at 3 o'clock, 4 cm from the nipple. A core needle biopsy was performed. This demonstrated high grade DCIS, ER+, PR -.   She has not had a cancer dx before this. Her mother had colon cancer in her 49s or 48s, but is 37 and still alive.   Films and pathology reviewed in multidisciplinary fashion.   Menarche: 53 Parity 0  Work: office work at [employer]  Diagnostic mammogram [facility]:12/25/2022  Dx u/s [facility] 12/25/2022  Pathology core needle biopsy: 01/03/2023 High grade DCIS  Receptors:+/-  Review of Systems: A complete review of systems was obtained from the patient. I have reviewed this information and discussed as appropriate with the patient. See HPI as well for other ROS.  [place]:[id number] for fatigue, depression, sleeps on 2 pillows.   Medical History: Past Medical History:  Diagnosis Date  GERD (gastroesophageal reflux disease)  History of cancer  Sleep apnea   Patient Active Problem List  Diagnosis  Breast cancer of lower-inner quadrant of right female breast (CMS-HCC)   Past Surgical History:  Procedure Laterality Date  CHOLECYSTECTOMY  Ectopic pregnancy surgery    Allergies  Allergen Reactions  Diphenhydramine Hcl Palpitations  REACTION: VERY  WIRED/palpitations Benadryl  Metronidazole Nausea And Vomiting  Penicillins Hives  REACTION: HIVES  Tetanus Toxoid Fluid Swelling  REACTION: REALLY BAD SWELLING   Current Outpatient Medications on File Prior to Visit  Medication Sig Dispense Refill  ALPRAZolam (XANAX) 0.25 MG tablet Take by mouth  ascorbic acid, vitamin C, 500 mg Chew Take 1 tablet by mouth once daily  multivitamin (MULTIPLE VITAMINS ORAL) Take 2 tablets by mouth once daily   No current facility-administered medications on file prior to visit.   Family History  Problem Relation Age of Onset  High blood pressure (Hypertension) Mother  Coronary Artery Disease (Blocked arteries around heart) Mother  Colon cancer Mother  Hyperlipidemia (Elevated cholesterol) Father  Diabetes Sister    Social History   Tobacco Use  Smoking Status Former  Types: Cigarettes  Smokeless Tobacco Never    Social History   Socioeconomic History  Marital status: Single  Tobacco Use  Smoking status: Former  Types: Cigarettes  Smokeless tobacco: Never  Substance and Sexual Activity  Alcohol use: Yes  Alcohol/week: 3.0 - 4.0 standard drinks of alcohol  Types: 3 - 4 Standard drinks or equivalent per week  Drug use: Defer   Objective:   Vitals:  01/15/23 1521  BP: 136/73  Pulse: 71  Temp: 36.1 C (97 F)  Weight: 70.5 kg (155 lb 8 oz)  Height: 157.5 cm (5' 2"$ )   Body mass index is 28.44 kg/m.  Gen: No acute distress. Well nourished and well groomed.  Neurological: Alert and oriented to person, place, and time. Coordination normal.  Head: Normocephalic and atraumatic.  Eyes: Conjunctivae are normal.  Pupils are equal, round, and reactive to light. No scleral icterus.  Neck: Normal range of motion. Neck supple. No tracheal deviation or thyromegaly present.  Cardiovascular: Normal rate, regular rhythm, normal heart sounds and intact distal pulses. Exam reveals no gallop and no friction rub. No murmur heard. Breast: faint  bruising right breast. No palpable masses. No skin dimpling or nipple retraction. Breasts relatively symmetric. No LAD. Left breast benign.  Respiratory: Effort normal. No respiratory distress. No chest wall tenderness. Breath sounds normal. No wheezes, rales or rhonchi.  GI: Soft. Bowel sounds are normal. The abdomen is soft and nontender. There is no rebound and no guarding.  Musculoskeletal: Normal range of motion. Extremities are nontender.  Lymphadenopathy: No cervical, preauricular, postauricular or axillary adenopathy is present Skin: Skin is warm and dry. No rash noted. No diaphoresis. No erythema. No pallor. No clubbing, cyanosis, or edema.  Psychiatric: Normal mood and affect. Behavior is normal. Judgment and thought content normal.   Labs  CBC and CMET normal 01/15/2023  Assessment and Plan:   ICD-10-CM  1. Malignant neoplasm of lower-inner quadrant of right female breast, unspecified estrogen receptor status (CMS-HCC) C50.311    Pt has a new diagnosis of right breast cancer, cTis. We will plan seed localized lumpectomy followed by adjuvant radiation and antihormonal tx. She is also offered genetic testing today.   We discussed surgery including the process, scheduling, expected recovery, risks. I reviewed timing, outpatient nature of surgery and restrictions. I reviewed seed placement 1-2 days pre op.   We discussed the risks bleeding, infection, damage to other structures, need for further procedures/surgeries. We discussed the risk of seroma. The patient was advised if the breast has cancer, we may need to go back to surgery for additional tissue to obtain negative margins or for a lymph node biopsy. The patient was advised that these are the most common complications, but that others can occur as well. I discussed the risk of alteration in breast contour or size. I discussed risk of chronic pain. There are rare instances of heart/lung issues post op as well as blood clots.    They were advised against taking aspirin or other anti-inflammatory agents/blood thinners the week before surgery.   The risks and benefits of the procedure were described to the patient and she wishes to proceed.   No follow-ups on file.  Milus Height, MD FACS Surgical Oncology, General Surgery, Trauma and [facility] [place]

## 2023-01-30 NOTE — Anesthesia Procedure Notes (Signed)
Procedure Name: LMA Insertion Date/Time: 01/30/2023 9:37 AM  Performed by: Darletta Moll, CRNAPre-anesthesia Checklist: Patient identified, Emergency Drugs available, Suction available and Patient being monitored Patient Re-evaluated:Patient Re-evaluated prior to induction Oxygen Delivery Method: Circle system utilized Preoxygenation: Pre-oxygenation with 100% oxygen Induction Type: IV induction Ventilation: Mask ventilation without difficulty LMA: LMA inserted LMA Size: 4.0 Number of attempts: 1 Airway Equipment and Method: Oral airway Placement Confirmation: positive ETCO2, breath sounds checked- equal and bilateral and CO2 detector Tube secured with: Tape Dental Injury: Teeth and Oropharynx as per pre-operative assessment

## 2023-01-30 NOTE — Interval H&P Note (Signed)
History and Physical Interval Note:  01/30/2023 9:02 AM  Debra Norton  has presented today for surgery, with the diagnosis of RIGHT BREAST CANCER.  The various methods of treatment have been discussed with the patient and family. After consideration of risks, benefits and other options for treatment, the patient has consented to  Procedure(s): RIGHT BREAST LUMPECTOMY WITH RADIOACTIVE SEED LOCALIZATION (Right) as a surgical intervention.  The patient's history has been reviewed, patient examined, no change in status, stable for surgery.  I have reviewed the patient's chart and labs.  Questions were answered to the patient's satisfaction.     Stark Klein

## 2023-01-30 NOTE — Op Note (Signed)
Right Breast Radioactive seed localized lumpectomy  Indications: This patient presents with history of right breast cancer, upper inner quadrant, cTis receptors ER +, PR -  Pre-operative Diagnosis: right breast cancer, stage 0  Post-operative Diagnosis: Same  Surgeon: Stark Klein   Assistant: Celene Squibb, RNFA  Anesthesia: General endotracheal anesthesia  ASA Class: 2  Procedure Details  The patient was seen in the Holding Room. The risks, benefits, complications, treatment options, and expected outcomes were discussed with the patient. The possibilities of bleeding, infection, the need for additional procedures, failure to diagnose a condition, and creating a complication requiring other procedures or operations were discussed with the patient. The patient concurred with the proposed plan, giving informed consent.  The site of surgery properly noted/marked. The patient was taken to Operating Room # 2, identified, and the procedure verified as Right breast seed localized lumpectomy.  The right breast and chest were prepped and draped in standard fashion. A medial circumareolar incision was made near the previously placed radioactive seed.  Dissection was carried down around the point of maximum signal intensity. The cautery was used to perform the dissection.   The specimen was inked with the margin marker paint kit.    Specimen radiography confirmed inclusion of the mammographic lesion, the clip, and the seed.  The background signal in the breast was zero.  3D evaluation of the specimen showed the clip and seed to be centrally located in the specimen.    Hemostasis was achieved with cautery.  The cavity was marked with clips on each border other than the anterior border.  Local was infiltrated into the skin and breast around the cavity.  The underlying tissue was mobilized with the cautery in order to create advancement flaps for mastopexy to help fill the defect left from the lumpectomy.  2-0 vicryl was used to reapproximate this tissue.    The wound was irrigated and closed with 3-0 vicryl interrupted deep dermal sutures and 4-0 monocryl running subcuticular suture.      Sterile dressings were applied. At the end of the operation, all sponge, instrument, and needle counts were correct.   Findings: Seed, clip in specimen.  anterior margin is skin, posterior margin is pectoralis   Estimated Blood Loss:  min         Specimens: right breast tissue with seed         Complications:  None; patient tolerated the procedure well.         Disposition: PACU - hemodynamically stable.         Condition: stable

## 2023-01-31 ENCOUNTER — Encounter (HOSPITAL_COMMUNITY): Payer: Self-pay | Admitting: General Surgery

## 2023-01-31 LAB — SURGICAL PATHOLOGY

## 2023-02-01 NOTE — Anesthesia Postprocedure Evaluation (Signed)
Anesthesia Post Note  Patient: Debra Norton  Procedure(s) Performed: RIGHT BREAST LUMPECTOMY WITH RADIOACTIVE SEED LOCALIZATION (Right: Breast)     Patient location during evaluation: PACU Anesthesia Type: General Level of consciousness: awake and alert Pain management: pain level controlled Vital Signs Assessment: post-procedure vital signs reviewed and stable Respiratory status: spontaneous breathing, nonlabored ventilation and respiratory function stable Cardiovascular status: blood pressure returned to baseline and stable Postop Assessment: no apparent nausea or vomiting Anesthetic complications: no   No notable events documented.  Last Vitals:  Vitals:   01/30/23 1115 01/30/23 1130  BP: (!) 112/52 (!) 127/57  Pulse: 75 71  Resp: 16 18  Temp:  36.4 C  SpO2: 94% 93%    Last Pain:  Vitals:   01/30/23 1130  TempSrc:   PainSc: 0-No pain                 Leonidus Rowand

## 2023-02-03 DIAGNOSIS — C50911 Malignant neoplasm of unspecified site of right female breast: Secondary | ICD-10-CM | POA: Diagnosis not present

## 2023-02-05 ENCOUNTER — Encounter: Payer: Self-pay | Admitting: Genetic Counselor

## 2023-02-05 ENCOUNTER — Ambulatory Visit: Payer: Self-pay | Admitting: Genetic Counselor

## 2023-02-05 DIAGNOSIS — Z1379 Encounter for other screening for genetic and chromosomal anomalies: Secondary | ICD-10-CM

## 2023-02-05 NOTE — Progress Notes (Signed)
HPI:   Debra Norton was previously seen in the [facility] clinic due to a personal and family history of cancer and concerns regarding a hereditary predisposition to cancer. Please refer to our prior cancer genetics clinic note for more information regarding our discussion, assessment and recommendations, at the time. Debra Norton recent genetic Norton results were disclosed to her, as were recommendations warranted by these results. These results and recommendations are discussed in more detail below.  CANCER HISTORY:  Oncology History  Ductal carcinoma in situ (DCIS) of right breast  01/13/2023 Initial Diagnosis   Screening mammogram detected right breast mass 1.6 x 0.5 cm at 3 o'clock position biopsy revealed high-grade DCIS solid and cribriform features ER 40%, PR 0%   01/15/2023 Cancer Staging   Staging form: Breast, AJCC 8th Edition - Clinical stage from 01/15/2023: Stage 0 (cTis (DCIS), cN0, cM0, ER+, PR-, HER2: Not Assessed) - Signed by Nicholas Lose, MD on 01/15/2023 Stage prefix: Initial diagnosis Nuclear grade: G3 Laterality: Right Staged by: Pathologist and managing physician Stage used in treatment planning: Yes National guidelines used in treatment planning: Yes Type of national guideline used in treatment planning: NCCN    Genetic Testing   [facility] Common Cancer Panel+RNA was Negative. Report date is 01/25/2023.  The Common Hereditary Cancers Panel offered by [facility] includes sequencing and/or deletion duplication testing of the following 48 genes: APC, ATM, AXIN2, BAP1, BARD1, BMPR1A, BRCA1, BRCA2, BRIP1, CDH1, CDK4, CDKN2A (p14ARF and p16INK4a only), CHEK2, CTNNA1, DICER1, EPCAM (Deletion/duplication testing only), FH, GREM1 (promoter region duplication testing only), HOXB13, KIT, MBD4, MEN1, MLH1, MSH2, MSH3, MSH6, MUTYH, NF1, NHTL1, PALB2, PDGFRA, PMS2, POLD1, POLE, PTEN, RAD51C, RAD51D, SDHA (sequencing analysis only except exon 14), SDHB, SDHC, SDHD,  SMAD4, SMARCA4. STK11, TP53, TSC1, TSC2, and VHL.     FAMILY HISTORY:  We obtained a detailed, 4-generation family history.  Significant diagnoses are listed below:      Family History  Problem Relation Age of Onset   Colon cancer Mother          in her 3s   Diabetes Sister     Ovarian cancer Cousin          Mat. 1st cousin   CAD Father          32   Breast cancer Neg Hx     Esophageal cancer Neg Hx     Pancreatic cancer Neg Hx         Debra Norton's mother was diagnosed with colon cancer in her 83s, she is currently 14. Her maternal aunt was diagnosed with an unknown type of cancer. This aunt's daughter (her first cousin) was diagnosed with ovarian cancer at an unknown age. Debra Norton is unaware of previous family history of genetic testing for hereditary cancer risks. There is no reported Ashkenazi Jewish ancestry.   GENETIC Norton RESULTS:  The [facility] Common Cancer Panel found no pathogenic mutations.  The Common Hereditary Cancers Panel offered by [facility] includes sequencing and/or deletion duplication testing of the following 48 genes: APC, ATM, AXIN2, BAP1, BARD1, BMPR1A, BRCA1, BRCA2, BRIP1, CDH1, CDK4, CDKN2A (p14ARF and p16INK4a only), CHEK2, CTNNA1, DICER1, EPCAM (Deletion/duplication testing only), FH, GREM1 (promoter region duplication testing only), HOXB13, KIT, MBD4, MEN1, MLH1, MSH2, MSH3, MSH6, MUTYH, NF1, NHTL1, PALB2, PDGFRA, PMS2, POLD1, POLE, PTEN, RAD51C, RAD51D, SDHA (sequencing analysis only except exon 14), SDHB, SDHC, SDHD, SMAD4, SMARCA4. STK11, TP53, TSC1, TSC2, and VHL.   The Norton report has been scanned into EPIC and is  located under the Molecular Pathology section of the Results Review tab.  A portion of the result report is included below for reference. Genetic testing reported out on 01/25/2023.      Even though a pathogenic variant was not identified, possible explanations for the cancer in the family may include: There may be no hereditary  risk for cancer in the family. The cancers in Debra Norton and/or her family may be due to other genetic or environmental factors. There may be a gene mutation in one of these genes that current testing methods cannot detect, but that chance is small. There could be another gene that has not yet been discovered, or that we have not yet tested, that is responsible for the cancer diagnoses in the family.   Therefore, it is important to remain in touch with cancer genetics in the future so that we can continue to offer Debra Norton the most up to date genetic testing.   ADDITIONAL GENETIC TESTING:  We discussed with Debra Norton that her genetic testing was fairly extensive.  If there are genes identified to increase cancer risk that can be analyzed in the future, we would be happy to discuss and coordinate this testing at that time.    CANCER SCREENING RECOMMENDATIONS:  Debra Norton result is considered negative (normal).  This means that we have not identified a hereditary cause for her personal and family history of cancer at this time.   An individual's cancer risk and medical management are not determined by genetic Norton results alone. Overall cancer risk assessment incorporates additional factors, including personal medical history, family history, and any available genetic information that may result in a personalized plan for cancer prevention and surveillance. Therefore, it is recommended she continue to follow the cancer management and screening guidelines provided by her oncology and primary healthcare provider.  Based on the reported personal and family history, specific cancer screenings for Debra Norton and her family include:  Colon Cancer Screening: Due to Debra Norton's mother's history of colon cancer, she is recommended to repeat colonoscopies every 5 years. More frequent colonoscopies may be recommended if polyps are identified.  FOLLOW-UP:   Cancer genetics is a rapidly advancing field and it is possible that new genetic tests will be appropriate for her and/or her family members in the future. We encouraged her to remain in contact with cancer genetics on an annual basis so we can update her personal and family histories and let her know of advances in cancer genetics that may benefit this family.   Our contact number was provided. Debra Norton questions were answered to her satisfaction, and she knows she is welcome to call us at anytime with additional questions or concerns.   Lucille Passy, MS, [facility] Genetic Counselor [email or web address] (P) [phone]

## 2023-02-06 ENCOUNTER — Encounter: Payer: Self-pay | Admitting: General Practice

## 2023-02-06 ENCOUNTER — Encounter: Payer: Self-pay | Admitting: *Deleted

## 2023-02-06 ENCOUNTER — Encounter (HOSPITAL_COMMUNITY): Payer: Self-pay

## 2023-02-06 NOTE — Progress Notes (Signed)
[facility] Spiritual Care Note  Followed up with [name] by phone. She reports that she is doing well after surgery and is practicing good self-care during her week of recovery before returning to work next week. We plan to follow up by phone in ca two weeks.   [place], [place], [facility] Pager [phone] Voicemail [phone]

## 2023-02-12 NOTE — Progress Notes (Signed)
Patient Care Team: Terrilyn Saver, NP as PCP - General (Family Medicine) Huel Cote, NP (Inactive) as Nurse Practitioner (Obstetrics and Gynecology) Rolm Bookbinder, MD as Consulting Physician (General Surgery) Stark Klein, MD as Consulting Physician (General Surgery) Nicholas Lose, MD as Consulting Physician (Hematology and Oncology) Kyung Rudd, MD as Consulting Physician (Radiation Oncology) Mauro Kaufmann, RN as Oncology Nurse Navigator Rockwell Germany, RN as Oncology Nurse Navigator  DIAGNOSIS: No diagnosis found.  SUMMARY OF ONCOLOGIC HISTORY: Oncology History  Ductal carcinoma in situ (DCIS) of right breast  01/13/2023 Initial Diagnosis   Screening mammogram detected right breast mass 1.6 x 0.5 cm at 3 o'clock position biopsy revealed high-grade DCIS solid and cribriform features ER 40%, PR 0%   01/15/2023 Cancer Staging   Staging form: Breast, AJCC 8th Edition - Clinical stage from 01/15/2023: Stage 0 (cTis (DCIS), cN0, cM0, ER+, PR-, HER2: Not Assessed) - Signed by Nicholas Lose, MD on 01/15/2023 Stage prefix: Initial diagnosis Nuclear grade: G3 Laterality: Right Staged by: Pathologist and managing physician Stage used in treatment planning: Yes National guidelines used in treatment planning: Yes Type of national guideline used in treatment planning: NCCN    Genetic Testing   [facility] Common Cancer Panel+RNA was Negative. Report date is 01/25/2023.  The Common Hereditary Cancers Panel offered by [facility] includes sequencing and/or deletion duplication testing of the following 48 genes: APC, ATM, AXIN2, BAP1, BARD1, BMPR1A, BRCA1, BRCA2, BRIP1, CDH1, CDK4, CDKN2A (p14ARF and p16INK4a only), CHEK2, CTNNA1, DICER1, EPCAM (Deletion/duplication testing only), FH, GREM1 (promoter region duplication testing only), HOXB13, KIT, MBD4, MEN1, MLH1, MSH2, MSH3, MSH6, MUTYH, NF1, NHTL1, PALB2, PDGFRA, PMS2, POLD1, POLE, PTEN, RAD51C, RAD51D, SDHA (sequencing analysis only except  exon 14), SDHB, SDHC, SDHD, SMAD4, SMARCA4. STK11, TP53, TSC1, TSC2, and VHL.     CHIEF COMPLIANT:   INTERVAL HISTORY: Debra Norton is a   ALLERGIES:  is allergic to diphenhydramine hcl, flagyl [metronidazole], penicillins, tetanus toxoid, clindamycin/lincomycin, and hydrocodone.  MEDICATIONS:  Current Outpatient Medications  Medication Sig Dispense Refill   ALPRAZolam (XANAX) 0.25 MG tablet Take 1 tablet (0.25 mg total) by mouth at bedtime as needed for anxiety. 30 tablet 0   ondansetron (ZOFRAN) 8 MG tablet Take 0.5 tablets (4 mg total) by mouth every 8 (eight) hours as needed for nausea or vomiting. 10 tablet 1   oxyCODONE (OXY IR/ROXICODONE) 5 MG immediate release tablet Take 1 tablet (5 mg total) by mouth every 6 (six) hours as needed for severe pain. 5 tablet 0   traMADol (ULTRAM) 50 MG tablet Take 50 mg by mouth every 6 (six) hours as needed for moderate pain.     No current facility-administered medications for this visit.    PHYSICAL EXAMINATION: ECOG PERFORMANCE STATUS: {CHL ONC ECOG PS:[phone]}  There were no vitals filed for this visit. There were no vitals filed for this visit.  BREAST:*** No palpable masses or nodules in either right or left breasts. No palpable axillary supraclavicular or infraclavicular adenopathy no breast tenderness or nipple discharge. (exam performed in the presence of a chaperone)  LABORATORY DATA:  I have reviewed the data as listed    Latest Ref Rng & Units 01/24/2023    3:50 PM 01/15/2023   12:01 PM 12/11/2022    9:50 AM  CMP  Glucose 70 - 99 mg/dL 103  92  86   BUN 8 - 23 mg/dL '14  14  11   '$ Creatinine 0.44 - 1.00 mg/dL 0.83  0.88  0.77  Sodium 135 - 145 mmol/L 139  140  140   Potassium 3.5 - 5.1 mmol/L 3.8  3.8  4.2   Chloride 98 - 111 mmol/L 103  105  107   CO2 22 - 32 mmol/L '24  29  26   '$ Calcium 8.9 - 10.3 mg/dL 9.2  9.9  9.6   Total Protein 6.5 - 8.1 g/dL  7.6  6.7   Total Bilirubin 0.3 - 1.2 mg/dL  0.5  0.4    Alkaline Phos 38 - 126 U/L  73  72   AST 15 - 41 U/L  18  19   ALT 0 - 44 U/L  12  14     Lab Results  Component Value Date   WBC 9.0 01/24/2023   HGB 13.4 01/24/2023   HCT 39.6 01/24/2023   MCV 90.0 01/24/2023   PLT 304 01/24/2023   NEUTROABS 4.1 01/15/2023    ASSESSMENT & PLAN:  No problem-specific Assessment & Plan notes found for this encounter.    No orders of the defined types were placed in this encounter.  The patient has a good understanding of the overall plan. she agrees with it. she will call with any problems that may develop before the next visit here. Total time spent: 30 mins including face to face time and time spent for planning, charting and co-ordination of care   Suzzette Righter, [place] 02/12/23    I Gardiner Coins am acting as a Education administrator for [employer]  ***

## 2023-02-13 ENCOUNTER — Inpatient Hospital Stay: Payer: Medicare HMO | Attending: Hematology and Oncology | Admitting: Hematology and Oncology

## 2023-02-13 ENCOUNTER — Other Ambulatory Visit: Payer: Self-pay

## 2023-02-13 VITALS — BP 143/68 | HR 73 | Temp 97.9°F | Resp 18 | Ht 62.0 in | Wt 154.1 lb

## 2023-02-13 DIAGNOSIS — D0511 Intraductal carcinoma in situ of right breast: Secondary | ICD-10-CM | POA: Diagnosis not present

## 2023-02-13 DIAGNOSIS — Z170 Estrogen receptor positive status [ER+]: Secondary | ICD-10-CM | POA: Diagnosis not present

## 2023-02-13 NOTE — Assessment & Plan Note (Signed)
01/13/2023:Screening mammogram detected right breast mass 1.6 x 0.5 cm at 3 o'clock position biopsy revealed high-grade DCIS solid and cribriform features ER 40%, PR 0% 01/30/2023:Right lumpectomy: High-grade DCIS with necrosis 1 cm, no evidence of invasive cancer, margins negative, ER 40%, PR 0%  Pathology counseling: I discussed the final pathology report of the patient provided  a copy of this report. I discussed the margins.  We also discussed the final staging along with previously performed ER/PR testing.  Treatment plan: adjuvant radiation therapy Followed by antiestrogen therapy with tamoxifen 5 years  Return to clinic at the end of radiation to start antiestrogen therapy.

## 2023-02-18 NOTE — Progress Notes (Signed)
Nursing interview for Ductal carcinoma in situ (DCIS) of right breast.  Patient identity verified. Patient doing well. No RT breast discomfort conveyed at this time.  Meaningful use complete. Postmenopausal- NO chances of pregnancy  Vitals- BP (!) 124/59 (BP Location: Left Arm, Patient Position: Sitting, Cuff Size: Normal)   Pulse 65   Temp 97.8 F (36.6 C) (Temporal)   Resp 20   Ht '5\' 2"'$  (1.575 m)   Wt 151 lb 3.2 oz (68.6 kg)   SpO2 100%   BMI 27.65 kg/m   This concludes the interview.   Leandra Kern, LPN

## 2023-02-20 ENCOUNTER — Encounter: Payer: Self-pay | Admitting: General Practice

## 2023-02-20 NOTE — Progress Notes (Signed)
[facility] Note  Followed up with Debra Norton by phone. She was in good spirits, citing recovery from surgery and enjoyment of a new relationship. Debra Norton identified some nervousness about starting radation, while also being eager to complete it so that she can move on.   Provided pastoral presence, reflective listening, and normalization of feelings. Debra Norton knows to contact [facility] whenever needed/desired for follow-up support.   [place], [place], [facility] Pager [phone] Voicemail [phone]

## 2023-02-24 NOTE — Progress Notes (Signed)
Radiation Oncology         [phone] ________________________________  Name: [name]        MRN: [id number]  Date of Service: 02/27/2023 DOB: 1956/05/29  [id number], Purcell Nails, NP  Nicholas Lose, MD     REFERRING PHYSICIAN: Nicholas Lose, MD   DIAGNOSIS: The encounter diagnosis was Ductal carcinoma in situ (DCIS) of right breast.   HISTORY OF PRESENT ILLNESS: Debra Norton is a 67 y.o. female originally seen in the multidisciplinary breast clinic for a new diagnosis of right breast cancer. The patient presented for screening mammography on 12/18/2022 which showed a developing density versus under compressed tissue of the right breast.  She returned for diagnostic imaging of the right breast which showed a mass in the inner aspect of the right breast.  By ultrasound a mass was seen in the 3-3 30 o'clock position consistent with the mammographic findings with architectural distortion measuring up to 1.6 cm.  Her axilla was negative for adenopathy.  She underwent a biopsy on 01/03/2023 which showed high-grade DCIS.  Her cancer was ER positive, PR negative.   Since her last visit, the patient has undergone a right lumpectomy on 01/30/2023 with Dr. Barry Dienes.  Final pathology showed a 1 cm area of high-grade DCIS with necrosis.  No invasive disease was present and her margins were negative.  She is seen today to discuss adjuvant radiotherapy.    PREVIOUS RADIATION THERAPY: No   PAST MEDICAL HISTORY:  Past Medical History:  Diagnosis Date   Anxiety    Cancer ([place])    Cervical dysplasia    Cholelithiasis    31m stone seen on ultrasound in 2009   Colon polyp    Complication of anesthesia    pain was not controlled in PACU- had to be admitted   Depression    Diverticulitis    Migraine headache    years ago   Vertigo        PAST SURGICAL HISTORY: Past Surgical History:  Procedure Laterality Date   BREAST LUMPECTOMY WITH RADIOACTIVE SEED LOCALIZATION Right 01/30/2023    Procedure: RIGHT BREAST LUMPECTOMY WITH RADIOACTIVE SEED LOCALIZATION;  Surgeon: BStark Klein MD;  [facility]  Service: General;  Laterality: Right;   CHOLECYSTECTOMY     COLPOSCOPY     ECTOPIC PREGNANCY SURGERY     EXTERNAL EAR SURGERY Left    GYNECOLOGIC CRYOSURGERY       FAMILY HISTORY:  Family History  Problem Relation Age of Onset   Colon cancer Mother 410-- 24  CAD Father        8104  Diabetes Sister    Ovarian cancer Cousin        maternal first cousin   Breast cancer Neg Hx    Esophageal cancer Neg Hx    Pancreatic cancer Neg Hx      SOCIAL HISTORY:  reports that she has quit smoking. She has never used smokeless tobacco. She reports current alcohol use of about 3.0 standard drinks of alcohol per week. She reports that she does not currently use drugs.  The patient is widowed and [place] in [place]  She works for [employer] the [employer]   ALLERGIES: Diphenhydramine hcl, Flagyl [metronidazole], Penicillins, Tetanus toxoid, Clindamycin/lincomycin, and Hydrocodone   MEDICATIONS:  Current Outpatient Medications  Medication Sig Dispense Refill   ALPRAZolam (XANAX) 0.25 MG tablet Take 1 tablet (0.25 mg total) by mouth at bedtime as needed for anxiety. 30 tablet  0   HYDROmorphone (DILAUDID) 2 MG tablet      ondansetron (ZOFRAN) 8 MG tablet Take 0.5 tablets (4 mg total) by mouth every 8 (eight) hours as needed for nausea or vomiting. 10 tablet 1   oxyCODONE (OXY IR/ROXICODONE) 5 MG immediate release tablet Take 1 tablet (5 mg total) by mouth every 6 (six) hours as needed for severe pain. 5 tablet 0   traMADol (ULTRAM) 50 MG tablet Take 50 mg by mouth every 6 (six) hours as needed for moderate pain.     No current facility-administered medications for this encounter.     REVIEW OF SYSTEMS: On review of systems, the patient reports that she has felt well and is back to work without difficulties. She denies any difficulty with range of  motion. No other complaints are verbalized.      PHYSICAL EXAM:  Wt Readings from Last 3 Encounters:  02/13/23 154 lb 1.6 oz (69.9 kg)  01/30/23 150 lb (68 kg)  01/24/23 152 lb (68.9 kg)   Temp Readings from Last 3 Encounters:  02/13/23 97.9 F (36.6 C) (Temporal)  01/30/23 97.6 F (36.4 C)  01/24/23 98.2 F (36.8 C)   BP Readings from Last 3 Encounters:  02/13/23 (!) 143/68  01/30/23 (!) 127/57  01/24/23 122/65   Pulse Readings from Last 3 Encounters:  02/13/23 73  01/30/23 71  01/24/23 (!) 103    In general this is a well appearing Caucasian female in no acute distress. She's alert and oriented x4 and appropriate throughout the examination. Cardiopulmonary assessment is negative for acute distress and she exhibits normal effort.  Her right breast reveals a well-healed surgical incision site without erythema, separation, or drainage.   ECOG = 0  0 - Asymptomatic (Fully active, able to carry on all predisease activities without restriction)  1 - Symptomatic but completely ambulatory (Restricted in physically strenuous activity but ambulatory and able to carry out work of a light or sedentary nature. For example, light housework, office work)  2 - Symptomatic, <50% in bed during the day (Ambulatory and capable of all self care but unable to carry out any work activities. Up and about more than 50% of waking hours)  3 - Symptomatic, >50% in bed, but not bedbound (Capable of only limited self-care, confined to bed or chair 50% or more of waking hours)  4 - Bedbound (Completely disabled. Cannot carry on any self-care. Totally confined to bed or chair)  5 - Death   Eustace Pen MM, Creech RH, Tormey DC, et al. [phone]). "Toxicity and response criteria of the [facility]". [place] Oncol. 5 (6): 649-55    LABORATORY DATA:  Lab Results  Component Value Date   WBC 9.0 01/24/2023   HGB 13.4 01/24/2023   HCT 39.6 01/24/2023   MCV 90.0 01/24/2023   PLT  304 01/24/2023   Lab Results  Component Value Date   NA 139 01/24/2023   K 3.8 01/24/2023   CL 103 01/24/2023   CO2 24 01/24/2023   Lab Results  Component Value Date   ALT 12 01/15/2023   AST 18 01/15/2023   ALKPHOS 73 01/15/2023   BILITOT 0.5 01/15/2023      RADIOGRAPHY: No results found.     IMPRESSION/PLAN: 1. High grade, ER  positive DCIS of the right breast. Dr. Lisbeth Renshaw has reviewed her final pathology findings and today I reviewed the nature of early stage breast disease.  She has done well since her lumpectomy. Dr.  Moody recommends a course of  external radiotherapy to the breast  to reduce risks of local recurrence followed by antiestrogen therapy. We discussed the risks, benefits, short, and long term effects of radiotherapy, as well as the curative intent, and the patient is interested in proceeding.  I reviewed the delivery and logistics of radiotherapy and Dr. Lisbeth Renshaw recommends 4 weeks of radiotherapy to the right breast. Written consent is obtained and placed in the chart, a copy was provided to the patient. She will simulate today.     In a visit lasting 45 minutes, greater than 50% of the time was spent face to face reviewing her case, as well as in preparation of, discussing, and coordinating the patient's care.        Carola Rhine, [facility]    **Disclaimer: This note was dictated with voice recognition software. Similar sounding words can inadvertently be transcribed and this note may contain transcription errors which may not have been corrected upon publication of note.**

## 2023-02-27 ENCOUNTER — Ambulatory Visit
Admission: RE | Admit: 2023-02-27 | Discharge: 2023-02-27 | Disposition: A | Discharge status: Home / Self Care | Payer: Medicare HMO | Source: Ambulatory Visit | Attending: Radiation Oncology | Admitting: Radiation Oncology

## 2023-02-27 ENCOUNTER — Encounter: Payer: Self-pay | Admitting: Radiation Oncology

## 2023-02-27 ENCOUNTER — Other Ambulatory Visit: Payer: Self-pay

## 2023-02-27 VITALS — BP 124/59 | HR 65 | Temp 97.8°F | Resp 20 | Ht 62.0 in | Wt 151.2 lb

## 2023-02-27 DIAGNOSIS — Z79899 Other long term (current) drug therapy: Secondary | ICD-10-CM | POA: Diagnosis not present

## 2023-02-27 DIAGNOSIS — D0511 Intraductal carcinoma in situ of right breast: Secondary | ICD-10-CM | POA: Insufficient documentation

## 2023-02-27 DIAGNOSIS — Z86010 Personal history of colonic polyps: Secondary | ICD-10-CM | POA: Insufficient documentation

## 2023-02-27 DIAGNOSIS — Z8041 Family history of malignant neoplasm of ovary: Secondary | ICD-10-CM | POA: Insufficient documentation

## 2023-02-27 DIAGNOSIS — Z800 Family history of malignant neoplasm of digestive organs: Secondary | ICD-10-CM | POA: Insufficient documentation

## 2023-02-27 DIAGNOSIS — Z510 Encounter for antineoplastic radiation therapy: Secondary | ICD-10-CM | POA: Diagnosis not present

## 2023-02-27 DIAGNOSIS — Z87891 Personal history of nicotine dependence: Secondary | ICD-10-CM | POA: Diagnosis not present

## 2023-02-27 DIAGNOSIS — Z170 Estrogen receptor positive status [ER+]: Secondary | ICD-10-CM | POA: Diagnosis not present

## 2023-02-27 NOTE — Addendum Note (Signed)
Encounter addended by: Hayden Pedro, PA-C on: 02/27/2023 2:23 PM  Actions taken: Level of Service modified

## 2023-03-06 ENCOUNTER — Encounter: Payer: Self-pay | Admitting: *Deleted

## 2023-03-07 ENCOUNTER — Telehealth: Payer: Self-pay | Admitting: Hematology and Oncology

## 2023-03-07 NOTE — Telephone Encounter (Signed)
Per 3/21 IB reached out to patient to schedule, patient aware of date and time of appointment.

## 2023-03-10 DIAGNOSIS — Z510 Encounter for antineoplastic radiation therapy: Secondary | ICD-10-CM | POA: Diagnosis not present

## 2023-03-10 DIAGNOSIS — Z170 Estrogen receptor positive status [ER+]: Secondary | ICD-10-CM | POA: Diagnosis not present

## 2023-03-10 DIAGNOSIS — D0511 Intraductal carcinoma in situ of right breast: Secondary | ICD-10-CM | POA: Diagnosis not present

## 2023-03-13 ENCOUNTER — Other Ambulatory Visit: Payer: Self-pay

## 2023-03-13 ENCOUNTER — Ambulatory Visit
Admission: RE | Admit: 2023-03-13 | Discharge: 2023-03-13 | Disposition: A | Discharge status: Home / Self Care | Payer: TRICARE For Life (TFL) | Source: Ambulatory Visit | Attending: Radiation Oncology | Admitting: Radiation Oncology

## 2023-03-13 DIAGNOSIS — Z510 Encounter for antineoplastic radiation therapy: Secondary | ICD-10-CM | POA: Diagnosis not present

## 2023-03-13 DIAGNOSIS — Z170 Estrogen receptor positive status [ER+]: Secondary | ICD-10-CM | POA: Diagnosis not present

## 2023-03-13 DIAGNOSIS — D0511 Intraductal carcinoma in situ of right breast: Secondary | ICD-10-CM | POA: Diagnosis not present

## 2023-03-13 LAB — RAD ONC ARIA SESSION SUMMARY
Course Elapsed Days: 0
Plan Fractions Treated to Date: 1
Plan Prescribed Dose Per Fraction: 2.66 Gy
Plan Total Fractions Prescribed: 16
Plan Total Prescribed Dose: 42.56 Gy
Reference Point Dosage Given to Date: 2.66 Gy
Reference Point Session Dosage Given: 2.66 Gy
Session Number: 1

## 2023-03-14 ENCOUNTER — Other Ambulatory Visit: Payer: Self-pay

## 2023-03-14 ENCOUNTER — Ambulatory Visit
Admission: RE | Admit: 2023-03-14 | Discharge: 2023-03-14 | Disposition: A | Discharge status: Home / Self Care | Payer: Medicare HMO | Source: Ambulatory Visit | Attending: Radiation Oncology | Admitting: Radiation Oncology

## 2023-03-14 DIAGNOSIS — Z510 Encounter for antineoplastic radiation therapy: Secondary | ICD-10-CM | POA: Diagnosis not present

## 2023-03-14 DIAGNOSIS — D0511 Intraductal carcinoma in situ of right breast: Secondary | ICD-10-CM

## 2023-03-14 DIAGNOSIS — Z170 Estrogen receptor positive status [ER+]: Secondary | ICD-10-CM | POA: Diagnosis not present

## 2023-03-14 LAB — RAD ONC ARIA SESSION SUMMARY
Course Elapsed Days: 1
Plan Fractions Treated to Date: 2
Plan Prescribed Dose Per Fraction: 2.66 Gy
Plan Total Fractions Prescribed: 16
Plan Total Prescribed Dose: 42.56 Gy
Reference Point Dosage Given to Date: 5.32 Gy
Reference Point Session Dosage Given: 2.66 Gy
Session Number: 2

## 2023-03-14 MED ORDER — RADIAPLEXRX EX GEL: Freq: Once | CUTANEOUS | Status: AC

## 2023-03-14 MED ORDER — ALRA NON-METALLIC DEODORANT (RAD-ONC)
1.0000 | Freq: Once | TOPICAL | Status: AC
  Administered 2023-03-14: 1 via TOPICAL

## 2023-03-17 ENCOUNTER — Ambulatory Visit
Admission: RE | Admit: 2023-03-17 | Discharge: 2023-03-17 | Disposition: A | Discharge status: Home / Self Care | Payer: Medicare HMO | Source: Ambulatory Visit | Attending: Radiation Oncology | Admitting: Radiation Oncology

## 2023-03-17 ENCOUNTER — Other Ambulatory Visit: Payer: Self-pay

## 2023-03-17 DIAGNOSIS — Z170 Estrogen receptor positive status [ER+]: Secondary | ICD-10-CM | POA: Diagnosis not present

## 2023-03-17 DIAGNOSIS — D0511 Intraductal carcinoma in situ of right breast: Secondary | ICD-10-CM | POA: Insufficient documentation

## 2023-03-17 DIAGNOSIS — Z510 Encounter for antineoplastic radiation therapy: Secondary | ICD-10-CM | POA: Insufficient documentation

## 2023-03-17 LAB — RAD ONC ARIA SESSION SUMMARY
Course Elapsed Days: 4
Plan Fractions Treated to Date: 3
Plan Prescribed Dose Per Fraction: 2.66 Gy
Plan Total Fractions Prescribed: 16
Plan Total Prescribed Dose: 42.56 Gy
Reference Point Dosage Given to Date: 7.98 Gy
Reference Point Session Dosage Given: 2.66 Gy
Session Number: 3

## 2023-03-18 ENCOUNTER — Other Ambulatory Visit: Payer: Self-pay

## 2023-03-18 ENCOUNTER — Ambulatory Visit
Admission: RE | Admit: 2023-03-18 | Discharge: 2023-03-18 | Disposition: A | Discharge status: Home / Self Care | Payer: Medicare HMO | Source: Ambulatory Visit | Attending: Radiation Oncology | Admitting: Radiation Oncology

## 2023-03-18 DIAGNOSIS — Z170 Estrogen receptor positive status [ER+]: Secondary | ICD-10-CM | POA: Diagnosis not present

## 2023-03-18 DIAGNOSIS — D0511 Intraductal carcinoma in situ of right breast: Secondary | ICD-10-CM | POA: Diagnosis not present

## 2023-03-18 DIAGNOSIS — Z510 Encounter for antineoplastic radiation therapy: Secondary | ICD-10-CM | POA: Diagnosis not present

## 2023-03-18 LAB — RAD ONC ARIA SESSION SUMMARY
Course Elapsed Days: 5
Plan Fractions Treated to Date: 4
Plan Prescribed Dose Per Fraction: 2.66 Gy
Plan Total Fractions Prescribed: 16
Plan Total Prescribed Dose: 42.56 Gy
Reference Point Dosage Given to Date: 10.64 Gy
Reference Point Session Dosage Given: 2.66 Gy
Session Number: 4

## 2023-03-19 ENCOUNTER — Ambulatory Visit
Admission: RE | Admit: 2023-03-19 | Discharge: 2023-03-19 | Disposition: A | Discharge status: Home / Self Care | Payer: Medicare HMO | Source: Ambulatory Visit | Attending: Radiation Oncology | Admitting: Radiation Oncology

## 2023-03-19 ENCOUNTER — Other Ambulatory Visit: Payer: Self-pay

## 2023-03-19 DIAGNOSIS — Z510 Encounter for antineoplastic radiation therapy: Secondary | ICD-10-CM | POA: Diagnosis not present

## 2023-03-19 DIAGNOSIS — Z170 Estrogen receptor positive status [ER+]: Secondary | ICD-10-CM | POA: Diagnosis not present

## 2023-03-19 DIAGNOSIS — D0511 Intraductal carcinoma in situ of right breast: Secondary | ICD-10-CM | POA: Diagnosis not present

## 2023-03-19 LAB — RAD ONC ARIA SESSION SUMMARY
Course Elapsed Days: 6
Plan Fractions Treated to Date: 5
Plan Prescribed Dose Per Fraction: 2.66 Gy
Plan Total Fractions Prescribed: 16
Plan Total Prescribed Dose: 42.56 Gy
Reference Point Dosage Given to Date: 13.3 Gy
Reference Point Session Dosage Given: 2.66 Gy
Session Number: 5

## 2023-03-20 ENCOUNTER — Other Ambulatory Visit: Payer: Self-pay

## 2023-03-20 ENCOUNTER — Ambulatory Visit
Admission: RE | Admit: 2023-03-20 | Discharge: 2023-03-20 | Disposition: A | Discharge status: Home / Self Care | Payer: Medicare HMO | Source: Ambulatory Visit | Attending: Radiation Oncology | Admitting: Radiation Oncology

## 2023-03-20 DIAGNOSIS — Z170 Estrogen receptor positive status [ER+]: Secondary | ICD-10-CM | POA: Diagnosis not present

## 2023-03-20 DIAGNOSIS — Z510 Encounter for antineoplastic radiation therapy: Secondary | ICD-10-CM | POA: Diagnosis not present

## 2023-03-20 DIAGNOSIS — D0511 Intraductal carcinoma in situ of right breast: Secondary | ICD-10-CM | POA: Diagnosis not present

## 2023-03-20 LAB — RAD ONC ARIA SESSION SUMMARY
Course Elapsed Days: 7
Plan Fractions Treated to Date: 6
Plan Prescribed Dose Per Fraction: 2.66 Gy
Plan Total Fractions Prescribed: 16
Plan Total Prescribed Dose: 42.56 Gy
Reference Point Dosage Given to Date: 15.96 Gy
Reference Point Session Dosage Given: 2.66 Gy
Session Number: 6

## 2023-03-21 ENCOUNTER — Other Ambulatory Visit: Payer: Self-pay

## 2023-03-21 ENCOUNTER — Ambulatory Visit
Admission: RE | Admit: 2023-03-21 | Discharge: 2023-03-21 | Disposition: A | Discharge status: Home / Self Care | Payer: Medicare HMO | Source: Ambulatory Visit | Attending: Radiation Oncology | Admitting: Radiation Oncology

## 2023-03-21 DIAGNOSIS — Z170 Estrogen receptor positive status [ER+]: Secondary | ICD-10-CM | POA: Diagnosis not present

## 2023-03-21 DIAGNOSIS — D0511 Intraductal carcinoma in situ of right breast: Secondary | ICD-10-CM | POA: Diagnosis not present

## 2023-03-21 DIAGNOSIS — Z510 Encounter for antineoplastic radiation therapy: Secondary | ICD-10-CM | POA: Diagnosis not present

## 2023-03-21 LAB — RAD ONC ARIA SESSION SUMMARY
Course Elapsed Days: 8
Plan Fractions Treated to Date: 7
Plan Prescribed Dose Per Fraction: 2.66 Gy
Plan Total Fractions Prescribed: 16
Plan Total Prescribed Dose: 42.56 Gy
Reference Point Dosage Given to Date: 18.62 Gy
Reference Point Session Dosage Given: 2.66 Gy
Session Number: 7

## 2023-03-24 ENCOUNTER — Ambulatory Visit
Admission: RE | Admit: 2023-03-24 | Discharge: 2023-03-24 | Disposition: A | Discharge status: Home / Self Care | Payer: Medicare HMO | Source: Ambulatory Visit | Attending: Radiation Oncology | Admitting: Radiation Oncology

## 2023-03-24 ENCOUNTER — Other Ambulatory Visit: Payer: Self-pay

## 2023-03-24 DIAGNOSIS — D0511 Intraductal carcinoma in situ of right breast: Secondary | ICD-10-CM | POA: Diagnosis not present

## 2023-03-24 DIAGNOSIS — Z170 Estrogen receptor positive status [ER+]: Secondary | ICD-10-CM | POA: Diagnosis not present

## 2023-03-24 DIAGNOSIS — Z510 Encounter for antineoplastic radiation therapy: Secondary | ICD-10-CM | POA: Diagnosis not present

## 2023-03-24 LAB — RAD ONC ARIA SESSION SUMMARY
Course Elapsed Days: 11
Plan Fractions Treated to Date: 8
Plan Prescribed Dose Per Fraction: 2.66 Gy
Plan Total Fractions Prescribed: 16
Plan Total Prescribed Dose: 42.56 Gy
Reference Point Dosage Given to Date: 21.28 Gy
Reference Point Session Dosage Given: 2.66 Gy
Session Number: 8

## 2023-03-25 ENCOUNTER — Ambulatory Visit
Admission: RE | Admit: 2023-03-25 | Discharge: 2023-03-25 | Disposition: A | Discharge status: Home / Self Care | Payer: Medicare HMO | Source: Ambulatory Visit | Attending: Radiation Oncology | Admitting: Radiation Oncology

## 2023-03-25 ENCOUNTER — Other Ambulatory Visit: Payer: Self-pay

## 2023-03-25 DIAGNOSIS — D0511 Intraductal carcinoma in situ of right breast: Secondary | ICD-10-CM | POA: Diagnosis not present

## 2023-03-25 DIAGNOSIS — Z510 Encounter for antineoplastic radiation therapy: Secondary | ICD-10-CM | POA: Diagnosis not present

## 2023-03-25 DIAGNOSIS — Z170 Estrogen receptor positive status [ER+]: Secondary | ICD-10-CM | POA: Diagnosis not present

## 2023-03-25 LAB — RAD ONC ARIA SESSION SUMMARY
Course Elapsed Days: 12
Plan Fractions Treated to Date: 9
Plan Prescribed Dose Per Fraction: 2.66 Gy
Plan Total Fractions Prescribed: 16
Plan Total Prescribed Dose: 42.56 Gy
Reference Point Dosage Given to Date: 23.94 Gy
Reference Point Session Dosage Given: 2.66 Gy
Session Number: 9

## 2023-03-26 ENCOUNTER — Ambulatory Visit
Admission: RE | Admit: 2023-03-26 | Discharge: 2023-03-26 | Disposition: A | Discharge status: Home / Self Care | Payer: Medicare HMO | Source: Ambulatory Visit | Attending: Radiation Oncology | Admitting: Radiation Oncology

## 2023-03-26 ENCOUNTER — Other Ambulatory Visit: Payer: Self-pay

## 2023-03-26 DIAGNOSIS — Z170 Estrogen receptor positive status [ER+]: Secondary | ICD-10-CM | POA: Diagnosis not present

## 2023-03-26 DIAGNOSIS — Z510 Encounter for antineoplastic radiation therapy: Secondary | ICD-10-CM | POA: Diagnosis not present

## 2023-03-26 DIAGNOSIS — D0511 Intraductal carcinoma in situ of right breast: Secondary | ICD-10-CM | POA: Diagnosis not present

## 2023-03-26 LAB — RAD ONC ARIA SESSION SUMMARY
Course Elapsed Days: 13
Plan Fractions Treated to Date: 10
Plan Prescribed Dose Per Fraction: 2.66 Gy
Plan Total Fractions Prescribed: 16
Plan Total Prescribed Dose: 42.56 Gy
Reference Point Dosage Given to Date: 26.6 Gy
Reference Point Session Dosage Given: 2.66 Gy
Session Number: 10

## 2023-03-27 ENCOUNTER — Other Ambulatory Visit: Payer: Self-pay

## 2023-03-27 ENCOUNTER — Ambulatory Visit
Admission: RE | Admit: 2023-03-27 | Discharge: 2023-03-27 | Disposition: A | Discharge status: Home / Self Care | Payer: Medicare HMO | Source: Ambulatory Visit | Attending: Radiation Oncology | Admitting: Radiation Oncology

## 2023-03-27 DIAGNOSIS — Z170 Estrogen receptor positive status [ER+]: Secondary | ICD-10-CM | POA: Diagnosis not present

## 2023-03-27 DIAGNOSIS — D0511 Intraductal carcinoma in situ of right breast: Secondary | ICD-10-CM | POA: Diagnosis not present

## 2023-03-27 DIAGNOSIS — Z510 Encounter for antineoplastic radiation therapy: Secondary | ICD-10-CM | POA: Diagnosis not present

## 2023-03-27 LAB — RAD ONC ARIA SESSION SUMMARY
Course Elapsed Days: 14
Plan Fractions Treated to Date: 11
Plan Prescribed Dose Per Fraction: 2.66 Gy
Plan Total Fractions Prescribed: 16
Plan Total Prescribed Dose: 42.56 Gy
Reference Point Dosage Given to Date: 29.26 Gy
Reference Point Session Dosage Given: 2.66 Gy
Session Number: 11

## 2023-03-28 ENCOUNTER — Other Ambulatory Visit: Payer: Self-pay

## 2023-03-28 ENCOUNTER — Ambulatory Visit
Admission: RE | Admit: 2023-03-28 | Discharge: 2023-03-28 | Disposition: A | Discharge status: Home / Self Care | Payer: Medicare HMO | Source: Ambulatory Visit | Attending: Radiation Oncology | Admitting: Radiation Oncology

## 2023-03-28 ENCOUNTER — Ambulatory Visit: Payer: Medicare HMO | Admitting: Radiation Oncology

## 2023-03-28 DIAGNOSIS — Z170 Estrogen receptor positive status [ER+]: Secondary | ICD-10-CM | POA: Diagnosis not present

## 2023-03-28 DIAGNOSIS — D0511 Intraductal carcinoma in situ of right breast: Secondary | ICD-10-CM | POA: Diagnosis not present

## 2023-03-28 DIAGNOSIS — Z510 Encounter for antineoplastic radiation therapy: Secondary | ICD-10-CM | POA: Diagnosis not present

## 2023-03-28 LAB — RAD ONC ARIA SESSION SUMMARY
Course Elapsed Days: 15
Plan Fractions Treated to Date: 12
Plan Prescribed Dose Per Fraction: 2.66 Gy
Plan Total Fractions Prescribed: 16
Plan Total Prescribed Dose: 42.56 Gy
Reference Point Dosage Given to Date: 31.92 Gy
Reference Point Session Dosage Given: 2.66 Gy
Session Number: 12

## 2023-03-30 DIAGNOSIS — Z510 Encounter for antineoplastic radiation therapy: Secondary | ICD-10-CM | POA: Diagnosis not present

## 2023-03-30 DIAGNOSIS — Z170 Estrogen receptor positive status [ER+]: Secondary | ICD-10-CM | POA: Diagnosis not present

## 2023-03-30 DIAGNOSIS — D0511 Intraductal carcinoma in situ of right breast: Secondary | ICD-10-CM | POA: Diagnosis not present

## 2023-03-31 ENCOUNTER — Ambulatory Visit
Admission: RE | Admit: 2023-03-31 | Discharge: 2023-03-31 | Disposition: A | Discharge status: Home / Self Care | Payer: Medicare HMO | Source: Ambulatory Visit | Attending: Radiation Oncology | Admitting: Radiation Oncology

## 2023-03-31 ENCOUNTER — Other Ambulatory Visit: Payer: Self-pay

## 2023-03-31 DIAGNOSIS — Z510 Encounter for antineoplastic radiation therapy: Secondary | ICD-10-CM | POA: Diagnosis not present

## 2023-03-31 DIAGNOSIS — Z170 Estrogen receptor positive status [ER+]: Secondary | ICD-10-CM | POA: Diagnosis not present

## 2023-03-31 DIAGNOSIS — D0511 Intraductal carcinoma in situ of right breast: Secondary | ICD-10-CM | POA: Diagnosis not present

## 2023-03-31 LAB — RAD ONC ARIA SESSION SUMMARY
Course Elapsed Days: 18
Plan Fractions Treated to Date: 13
Plan Prescribed Dose Per Fraction: 2.66 Gy
Plan Total Fractions Prescribed: 16
Plan Total Prescribed Dose: 42.56 Gy
Reference Point Dosage Given to Date: 34.58 Gy
Reference Point Session Dosage Given: 2.66 Gy
Session Number: 13

## 2023-04-01 ENCOUNTER — Other Ambulatory Visit: Payer: Self-pay

## 2023-04-01 ENCOUNTER — Ambulatory Visit
Admission: RE | Admit: 2023-04-01 | Discharge: 2023-04-01 | Disposition: A | Discharge status: Home / Self Care | Payer: Medicare HMO | Source: Ambulatory Visit | Attending: Radiation Oncology | Admitting: Radiation Oncology

## 2023-04-01 DIAGNOSIS — Z510 Encounter for antineoplastic radiation therapy: Secondary | ICD-10-CM | POA: Diagnosis not present

## 2023-04-01 DIAGNOSIS — Z170 Estrogen receptor positive status [ER+]: Secondary | ICD-10-CM | POA: Diagnosis not present

## 2023-04-01 DIAGNOSIS — D0511 Intraductal carcinoma in situ of right breast: Secondary | ICD-10-CM | POA: Diagnosis not present

## 2023-04-01 LAB — RAD ONC ARIA SESSION SUMMARY
Course Elapsed Days: 19
Plan Fractions Treated to Date: 14
Plan Prescribed Dose Per Fraction: 2.66 Gy
Plan Total Fractions Prescribed: 16
Plan Total Prescribed Dose: 42.56 Gy
Reference Point Dosage Given to Date: 37.24 Gy
Reference Point Session Dosage Given: 2.66 Gy
Session Number: 14

## 2023-04-02 ENCOUNTER — Ambulatory Visit
Admission: RE | Admit: 2023-04-02 | Discharge: 2023-04-02 | Disposition: A | Discharge status: Home / Self Care | Payer: Medicare HMO | Source: Ambulatory Visit | Attending: Radiation Oncology | Admitting: Radiation Oncology

## 2023-04-02 ENCOUNTER — Other Ambulatory Visit: Payer: Self-pay

## 2023-04-02 DIAGNOSIS — D0511 Intraductal carcinoma in situ of right breast: Secondary | ICD-10-CM | POA: Diagnosis not present

## 2023-04-02 DIAGNOSIS — Z510 Encounter for antineoplastic radiation therapy: Secondary | ICD-10-CM | POA: Diagnosis not present

## 2023-04-02 DIAGNOSIS — Z170 Estrogen receptor positive status [ER+]: Secondary | ICD-10-CM | POA: Diagnosis not present

## 2023-04-02 LAB — RAD ONC ARIA SESSION SUMMARY
Course Elapsed Days: 20
Plan Fractions Treated to Date: 15
Plan Prescribed Dose Per Fraction: 2.66 Gy
Plan Total Fractions Prescribed: 16
Plan Total Prescribed Dose: 42.56 Gy
Reference Point Dosage Given to Date: 39.9 Gy
Reference Point Session Dosage Given: 2.66 Gy
Session Number: 15

## 2023-04-03 ENCOUNTER — Ambulatory Visit
Admission: RE | Admit: 2023-04-03 | Discharge: 2023-04-03 | Disposition: A | Discharge status: Home / Self Care | Payer: Medicare HMO | Source: Ambulatory Visit | Attending: Radiation Oncology | Admitting: Radiation Oncology

## 2023-04-03 ENCOUNTER — Other Ambulatory Visit: Payer: Self-pay

## 2023-04-03 DIAGNOSIS — D0511 Intraductal carcinoma in situ of right breast: Secondary | ICD-10-CM | POA: Diagnosis not present

## 2023-04-03 DIAGNOSIS — Z170 Estrogen receptor positive status [ER+]: Secondary | ICD-10-CM | POA: Diagnosis not present

## 2023-04-03 DIAGNOSIS — Z510 Encounter for antineoplastic radiation therapy: Secondary | ICD-10-CM | POA: Diagnosis not present

## 2023-04-03 LAB — RAD ONC ARIA SESSION SUMMARY
Course Elapsed Days: 21
Plan Fractions Treated to Date: 16
Plan Prescribed Dose Per Fraction: 2.66 Gy
Plan Total Fractions Prescribed: 16
Plan Total Prescribed Dose: 42.56 Gy
Reference Point Dosage Given to Date: 42.56 Gy
Reference Point Session Dosage Given: 2.66 Gy
Session Number: 16

## 2023-04-04 ENCOUNTER — Ambulatory Visit
Admission: RE | Admit: 2023-04-04 | Discharge: 2023-04-04 | Disposition: A | Discharge status: Home / Self Care | Payer: Medicare HMO | Source: Ambulatory Visit | Attending: Radiation Oncology | Admitting: Radiation Oncology

## 2023-04-04 ENCOUNTER — Other Ambulatory Visit: Payer: Self-pay

## 2023-04-04 DIAGNOSIS — Z170 Estrogen receptor positive status [ER+]: Secondary | ICD-10-CM | POA: Diagnosis not present

## 2023-04-04 DIAGNOSIS — Z510 Encounter for antineoplastic radiation therapy: Secondary | ICD-10-CM | POA: Diagnosis not present

## 2023-04-04 DIAGNOSIS — D0511 Intraductal carcinoma in situ of right breast: Secondary | ICD-10-CM | POA: Diagnosis not present

## 2023-04-04 LAB — RAD ONC ARIA SESSION SUMMARY
Course Elapsed Days: 22
Plan Fractions Treated to Date: 1
Plan Prescribed Dose Per Fraction: 2 Gy
Plan Total Fractions Prescribed: 4
Plan Total Prescribed Dose: 8 Gy
Reference Point Dosage Given to Date: 2 Gy
Reference Point Session Dosage Given: 2 Gy
Session Number: 17

## 2023-04-07 ENCOUNTER — Ambulatory Visit
Admission: RE | Admit: 2023-04-07 | Discharge: 2023-04-07 | Disposition: A | Discharge status: Home / Self Care | Payer: Medicare HMO | Source: Ambulatory Visit | Attending: Radiation Oncology

## 2023-04-07 ENCOUNTER — Telehealth: Payer: Self-pay | Admitting: Hematology and Oncology

## 2023-04-07 ENCOUNTER — Other Ambulatory Visit: Payer: Self-pay

## 2023-04-07 DIAGNOSIS — Z510 Encounter for antineoplastic radiation therapy: Secondary | ICD-10-CM | POA: Diagnosis not present

## 2023-04-07 DIAGNOSIS — D0511 Intraductal carcinoma in situ of right breast: Secondary | ICD-10-CM | POA: Diagnosis not present

## 2023-04-07 LAB — RAD ONC ARIA SESSION SUMMARY
Course Elapsed Days: 25
Plan Fractions Treated to Date: 2
Plan Prescribed Dose Per Fraction: 2 Gy
Plan Total Fractions Prescribed: 4
Plan Total Prescribed Dose: 8 Gy
Reference Point Dosage Given to Date: 4 Gy
Reference Point Session Dosage Given: 2 Gy
Session Number: 18

## 2023-04-07 NOTE — Telephone Encounter (Signed)
Rescheduled appointment per provider PAL. Left voicemail. 

## 2023-04-08 ENCOUNTER — Ambulatory Visit
Admission: RE | Admit: 2023-04-08 | Discharge: 2023-04-08 | Disposition: A | Discharge status: Home / Self Care | Payer: Medicare HMO | Source: Ambulatory Visit | Attending: Radiation Oncology | Admitting: Radiation Oncology

## 2023-04-08 ENCOUNTER — Other Ambulatory Visit: Payer: Self-pay

## 2023-04-08 DIAGNOSIS — Z510 Encounter for antineoplastic radiation therapy: Secondary | ICD-10-CM | POA: Diagnosis not present

## 2023-04-08 DIAGNOSIS — D0511 Intraductal carcinoma in situ of right breast: Secondary | ICD-10-CM | POA: Diagnosis not present

## 2023-04-08 LAB — RAD ONC ARIA SESSION SUMMARY
Course Elapsed Days: 26
Plan Fractions Treated to Date: 3
Plan Prescribed Dose Per Fraction: 2 Gy
Plan Total Fractions Prescribed: 4
Plan Total Prescribed Dose: 8 Gy
Reference Point Dosage Given to Date: 6 Gy
Reference Point Session Dosage Given: 2 Gy
Session Number: 19

## 2023-04-09 ENCOUNTER — Inpatient Hospital Stay: Payer: Medicare HMO | Attending: Hematology and Oncology | Admitting: Hematology and Oncology

## 2023-04-09 ENCOUNTER — Encounter: Payer: Self-pay | Admitting: *Deleted

## 2023-04-09 ENCOUNTER — Other Ambulatory Visit: Payer: Self-pay

## 2023-04-09 ENCOUNTER — Ambulatory Visit
Admission: RE | Admit: 2023-04-09 | Discharge: 2023-04-09 | Disposition: A | Discharge status: Home / Self Care | Payer: Medicare HMO | Source: Ambulatory Visit | Attending: Radiation Oncology | Admitting: Radiation Oncology

## 2023-04-09 VITALS — BP 132/66 | HR 78 | Temp 97.3°F | Resp 18 | Ht 62.0 in | Wt 149.2 lb

## 2023-04-09 DIAGNOSIS — Z510 Encounter for antineoplastic radiation therapy: Secondary | ICD-10-CM | POA: Diagnosis not present

## 2023-04-09 DIAGNOSIS — H02831 Dermatochalasis of right upper eyelid: Secondary | ICD-10-CM | POA: Diagnosis not present

## 2023-04-09 DIAGNOSIS — Z923 Personal history of irradiation: Secondary | ICD-10-CM | POA: Insufficient documentation

## 2023-04-09 DIAGNOSIS — Z170 Estrogen receptor positive status [ER+]: Secondary | ICD-10-CM | POA: Diagnosis not present

## 2023-04-09 DIAGNOSIS — H524 Presbyopia: Secondary | ICD-10-CM | POA: Diagnosis not present

## 2023-04-09 DIAGNOSIS — H25813 Combined forms of age-related cataract, bilateral: Secondary | ICD-10-CM | POA: Diagnosis not present

## 2023-04-09 DIAGNOSIS — H02834 Dermatochalasis of left upper eyelid: Secondary | ICD-10-CM | POA: Diagnosis not present

## 2023-04-09 DIAGNOSIS — D0511 Intraductal carcinoma in situ of right breast: Secondary | ICD-10-CM

## 2023-04-09 DIAGNOSIS — H35372 Puckering of macula, left eye: Secondary | ICD-10-CM | POA: Diagnosis not present

## 2023-04-09 LAB — RAD ONC ARIA SESSION SUMMARY
Course Elapsed Days: 27
Plan Fractions Treated to Date: 4
Plan Prescribed Dose Per Fraction: 2 Gy
Plan Total Fractions Prescribed: 4
Plan Total Prescribed Dose: 8 Gy
Reference Point Dosage Given to Date: 8 Gy
Reference Point Session Dosage Given: 2 Gy
Session Number: 20

## 2023-04-09 MED ORDER — TAMOXIFEN CITRATE 10 MG PO TABS: 5.0000 mg | ORAL_TABLET | Freq: Every day | ORAL | 1 refills | Status: DC

## 2023-04-09 NOTE — Assessment & Plan Note (Signed)
01/13/2023:Screening mammogram detected right breast mass 1.6 x 0.5 cm at 3 o'clock position biopsy revealed high-grade DCIS solid and cribriform features ER 40%, PR 0% 01/30/2023:Right lumpectomy: High-grade DCIS with necrosis 1 cm, no evidence of invasive cancer, margins negative, ER 40%, PR 0% 03/14/2023-04/09/2023: Adjuvant radiation  Treatment plan: Adjuvant tamoxifen 10 mg daily x 5 years (lower dose being used based on TAM-01 clinical trial showing lower doses of tamoxifen even as low as 5 mg were beneficial)  Tamoxifen counseling: We discussed the risks and benefits of tamoxifen. These include but not limited to insomnia, hot flashes, mood changes, vaginal dryness, and weight gain. Although rare, serious side effects including endometrial cancer, risk of blood clots were also discussed. We strongly believe that the benefits far outweigh the risks. Patient understands these risks and consented to starting treatment. Planned treatment duration is 5 years.  Return to clinic in 3 months for survivorship care plan visit

## 2023-04-09 NOTE — Progress Notes (Signed)
Patient Care Team: Clayborne Dana, NP as PCP - General (Family Medicine) Harrington Challenger, NP (Inactive) as Nurse Practitioner (Obstetrics and Gynecology) Emelia Loron, MD as Consulting Physician (General Surgery) Almond Lint, MD as Consulting Physician (General Surgery) Serena Croissant, MD as Consulting Physician (Hematology and Oncology) Dorothy Puffer, MD as Consulting Physician (Radiation Oncology) Pershing Proud, RN as Oncology Nurse Navigator Donnelly Angelica, RN as Oncology Nurse Navigator  DIAGNOSIS:  Encounter Diagnosis  Name Primary?   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 Report date is 01/25/2023.  The Common Hereditary Cancers Panel offered by [facility] includes sequencing and/or deletion duplication testing of the following 48 genes: APC, ATM, AXIN2, BAP1, BARD1, BMPR1A, BRCA1, BRCA2, BRIP1, CDH1, CDK4, CDKN2A (p14ARF and p16INK4a only), CHEK2, CTNNA1, DICER1, EPCAM (Deletion/duplication testing only), FH, GREM1 (promoter region duplication testing only), HOXB13, KIT, MBD4, MEN1, MLH1, MSH2, MSH3, MSH6, MUTYH, NF1, NHTL1, PALB2, PDGFRA,  PMS2, POLD1, POLE, PTEN, RAD51C, RAD51D, SDHA (sequencing analysis only except exon 14), SDHB, SDHC, SDHD, SMAD4, SMARCA4. STK11, TP53, TSC1, TSC2, and VHL.   01/30/2023 Surgery   Right lumpectomy: High-grade DCIS with necrosis 1 cm, no evidence of invasive cancer, margins negative, ER 40%, PR 0%     CHIEF COMPLIANT: Follow-up after radiation is complete  INTERVAL HISTORY: Debra Norton is a 67 year old with above-mentioned history of DCIS who underwent lumpectomy and has just completed radiation today.  She is here to discuss antiestrogen therapy.  She has done extremely well from radiation standpoint.  She has minimal radiation dermatitis.   ALLERGIES:  is allergic to diphenhydramine hcl, flagyl [metronidazole], penicillins, tetanus toxoid, clindamycin/lincomycin, and hydrocodone.  MEDICATIONS:  Current Outpatient Medications  Medication Sig Dispense Refill   ALPRAZolam (XANAX) 0.25 MG tablet Take 1 tablet (0.25 mg total) by mouth at bedtime as needed for anxiety. 30 tablet 0   HYDROmorphone (DILAUDID) 2 MG tablet  (Patient not taking: Reported on 02/27/2023)     ondansetron (ZOFRAN) 8 MG tablet Take 0.5 tablets (4 mg total) by mouth every 8 (eight) hours as needed for nausea or vomiting. (Patient not taking: Reported on 02/27/2023) 10 tablet 1   oxyCODONE (OXY IR/ROXICODONE) 5 MG immediate release tablet Take 1 tablet (5 mg total) by mouth every 6 (six) hours as needed for severe pain. (Patient not taking: Reported on 02/27/2023) 5 tablet 0   traMADol (ULTRAM) 50 MG tablet Take 50 mg by mouth every 6 (six) hours as needed for moderate pain. (Patient not taking: Reported on 02/27/2023)     No current facility-administered medications for this visit.    PHYSICAL EXAMINATION: ECOG PERFORMANCE STATUS: 1 - Symptomatic but completely ambulatory  Vitals:   04/09/23 1518  BP: 132/66  Pulse: 78  Resp: 18  Temp: (!) 97.3 F (36.3 C)  SpO2: 99%   Filed Weights   04/09/23 1518   Weight: 149 lb 3.2 oz (67.7 kg)      LABORATORY DATA:  I have reviewed the data as listed    Latest Ref Rng & Units 01/24/2023    3:50 PM 01/15/2023   12:01 PM 12/11/2022    9:50 AM  CMP  Glucose 70 - 99 mg/dL 829  92  86   BUN 8 - 23 mg/dL Creatinine 0.44 - 1.00 mg/dL 5.62  1.30  8.65   Sodium 135 - 145 mmol/L 139  140  140   Potassium 3.5 - 5.1 mmol/L 3.8  3.8  4.2   Chloride 98 - 111 mmol/L 103  105  107   CO2 22 - 32 mmol/L Calcium 8.9 - 10.3 mg/dL 9.2  9.9  9.6   Total Protein 6.5 - 8.1 g/dL  7.6  6.7   Total Bilirubin 0.3 - 1.2 mg/dL  0.5  0.4   Alkaline Phos 38 - 126 U/L  73  72   AST 15 - 41 U/L  18  19   ALT 0 - 44 U/L  12  14     Lab Results  Component Value Date   WBC 9.0 01/24/2023   HGB 13.4 01/24/2023   HCT 39.6 01/24/2023   MCV 90.0 01/24/2023   PLT 304 01/24/2023   NEUTROABS 4.1 01/15/2023    ASSESSMENT & PLAN:  Ductal carcinoma in situ (DCIS) of right breast 01/13/2023:Screening mammogram detected right breast mass 1.6 x 0.5 cm at 3 o'clock position biopsy revealed high-grade DCIS solid and cribriform features ER 40%, PR 0% 01/30/2023:Right lumpectomy: High-grade DCIS with necrosis 1 cm, no evidence of invasive cancer, margins negative, ER 40%, PR 0% 03/14/2023-04/09/2023: Adjuvant radiation  Treatment plan: Adjuvant tamoxifen 10 mg daily x 5 years (lower dose being used based on TAM-01 clinical trial showing lower doses of tamoxifen even as low as 5 mg were beneficial)  Tamoxifen counseling: We discussed the risks and benefits of tamoxifen. These include but not limited to insomnia, hot flashes, mood changes, vaginal dryness, and weight gain. Although rare, serious side effects including endometrial cancer, risk of blood clots were also discussed. We strongly believe that the benefits far outweigh the risks. Patient understands these risks and consented to starting treatment. Planned treatment duration is 5 years.  Return to clinic  in 3 months for survivorship care plan visit    No orders of the defined types were placed in this encounter.  The patient has a good understanding of the overall plan. she agrees with it. she will call with any problems that may develop before the next visit here. Total time spent: 30 mins including face to face time and time spent for planning, charting and co-ordination of care   Tamsen Meek, MD 04/09/23

## 2023-04-10 ENCOUNTER — Ambulatory Visit: Payer: Medicare HMO | Admitting: Hematology and Oncology

## 2023-04-11 NOTE — Radiation Completion Notes (Signed)
  Radiation Oncology         [phone] ________________________________  Name: Debra Norton MRN: [id number]  Date of Service: 04/09/2023  DOB: 1956-02-11  End of Treatment Note   Diagnosis:  High grade, ER positive DCIS of the right breast.   Intent: Curative     ==========DELIVERED PLANS==========  First Treatment Date: 2023-03-13 - Last Treatment Date: 2023-04-09   Plan Name: Breast_R Site: Breast, Right Technique: 3D Mode: Photon Dose Per Fraction: 2.66 Gy Prescribed Dose (Delivered / Prescribed): 42.56 Gy / 42.56 Gy Prescribed Fxs (Delivered / Prescribed): 16 / 16   Plan Name: Breast_R_Bst Site: Breast, Right Technique: 3D Mode: Photon Dose Per Fraction: 2 Gy Prescribed Dose (Delivered / Prescribed): 8 Gy / 8 Gy Prescribed Fxs (Delivered / Prescribed): 4 / 4     ==========ON TREATMENT VISIT DATES========== 2023-03-14, 2023-03-21, 2023-03-28, 2023-04-04   See weekly On Treatment Notes is Epic for details. The patient tolerated radiation. She developed fatigue and anticipated skin changes in the treatment field.   The patient will receive a call in about one month from the radiation oncology department. She will continue follow up with Dr. Pamelia Hoit as well.      Osker Mason, PAC

## 2023-04-14 ENCOUNTER — Telehealth: Payer: Self-pay | Admitting: *Deleted

## 2023-04-14 ENCOUNTER — Other Ambulatory Visit: Payer: Self-pay | Admitting: Radiation Oncology

## 2023-04-14 MED ORDER — SILVER SULFADIAZINE 1 % EX CREA: 1.0000 | TOPICAL_CREAM | Freq: Two times a day (BID) | CUTANEOUS | 1 refills | Status: DC

## 2023-04-14 NOTE — Telephone Encounter (Signed)
Received call from pt requesting advice from MD if okay to drink alcohol while on Tamoxifen.  Per MD okay for pt to drink occasionally (4 times a week) while on Tamoxifen.  Pt educated and verbalized understanding.  Pt also states said she just finished radiation last week and continues to have a red rash on her chest.  States radiation advised her to apply hydrocortisone cream as well as Aquaphor but both cause her to get blisters after applying it.  RN sent inbasket to radiation team to f/u with pt for further evaluation and tx.

## 2023-04-14 NOTE — Telephone Encounter (Signed)
Spoke with the patient regarding a message I received from medical oncology.  She reports she continues to have redness, rash, and blistering to her radiation field.  She completed radiation 04/08/2024.  I informed her that the continuation of skin changes following radiation completion is normal and the skin takes time to come back to normal, usually weeks up to months.  I let her know for the open/blistering we have sent in a prescription for Silvadene cream and she should use this twice daily.  I let her know we would check in with her later in the week.  She verbalized understanding and was appreciative of the call.  Coralyn Helling. Vickii Chafe, BSN

## 2023-04-24 DIAGNOSIS — K136 Irritative hyperplasia of oral mucosa: Secondary | ICD-10-CM | POA: Diagnosis not present

## 2023-05-06 ENCOUNTER — Ambulatory Visit (INDEPENDENT_AMBULATORY_CARE_PROVIDER_SITE_OTHER): Payer: Medicare HMO | Admitting: Nurse Practitioner

## 2023-05-06 VITALS — BP 118/70

## 2023-05-06 DIAGNOSIS — N9089 Other specified noninflammatory disorders of vulva and perineum: Secondary | ICD-10-CM | POA: Diagnosis not present

## 2023-05-06 DIAGNOSIS — B3731 Acute candidiasis of vulva and vagina: Secondary | ICD-10-CM

## 2023-05-06 LAB — WET PREP FOR TRICH, YEAST, CLUE

## 2023-05-06 MED ORDER — FLUCONAZOLE 150 MG PO TABS
150.0000 mg | ORAL_TABLET | ORAL | 0 refills | Status: DC
Start: 2023-05-06 — End: 2023-06-16

## 2023-05-06 NOTE — Progress Notes (Signed)
   Acute Office Visit  Subjective:    Patient ID: Debra Norton, female    DOB: [birth year], 67 y.o.   MRN: [id number]   HPI 67 y.o. presents today for vulvar irritation x 2 weeks. Recently changed soaps and she has always been sensitive to this. Sexually active. Denies urinary symptoms.   No LMP recorded. Patient is postmenopausal.    Review of Systems  Constitutional: Negative.   Genitourinary:  Positive for vaginal pain (Vulvar burning/irritation). Negative for dysuria, flank pain, frequency, genital sores, hematuria, urgency and vaginal discharge.       Objective:    Physical Exam Constitutional:      Appearance: Normal appearance.  Genitourinary:    Vagina: Erythema present. No vaginal discharge.     Cervix: Normal.     Comments: Generalized vulvar redness    BP 118/70 (BP Location: Left Arm, Patient Position: Sitting, Cuff Size: Normal)  Wt Readings from Last 3 Encounters:  04/09/23 149 lb 3.2 oz (67.7 kg)  02/27/23 151 lb 3.2 oz (68.6 kg)  02/13/23 154 lb 1.6 oz (69.9 kg)        Patient informed chaperone available to be present for breast and/or pelvic exam. Patient has requested no chaperone to be present. Patient has been advised what will be completed during breast and pelvic exam.   Wet prep + yeast  Assessment & Plan:   Problem List Items Addressed This Visit   None Visit Diagnoses     Vulvovaginal candidiasis    -  Primary   Relevant Medications   fluconazole (DIFLUCAN) 150 MG tablet   Vulvar irritation       Relevant Orders   WET PREP FOR TRICH, YEAST, CLUE      Plan: Wet prep positive for yeast - Diflucan 150 mg today and repeat in 3 days for total of 2 doses. Return to office if symptoms worsen or do not improve.      Olivia Mackie DNP, 3:28 PM 05/06/2023

## 2023-06-09 ENCOUNTER — Ambulatory Visit
Admission: RE | Admit: 2023-06-09 | Discharge: 2023-06-09 | Disposition: A | Discharge status: Home / Self Care | Payer: Medicare HMO | Source: Ambulatory Visit | Attending: Adult Health | Admitting: Adult Health

## 2023-06-09 NOTE — Progress Notes (Signed)
  Radiation Oncology         [phone] ________________________________  Name: Debra Norton MRN: [id number]  Date of Service: 06/09/2023  DOB: June 05, 1956  Post Treatment Telephone Note  Diagnosis:  High grade, ER positive DCIS of the right breast.   (as documented in provider EOT note)   The patient was available for call today.   Symptoms of fatigue have improved since completing therapy.  Symptoms of skin changes have improved since completing therapy.  The patient was encouraged to avoid sun exposure in the area of prior treatment for up to one year following radiation with either sunscreen or by the style of clothing worn in the sun.  The patient has scheduled follow up with her medical oncologist Dr. Pamelia Hoit for ongoing surveillance, and was encouraged to call if she develops concerns or questions regarding radiation.  This concludes the interaction.  Ruel Favors, LPN

## 2023-06-14 ENCOUNTER — Encounter: Payer: Self-pay | Admitting: Nurse Practitioner

## 2023-06-16 ENCOUNTER — Other Ambulatory Visit: Payer: Self-pay

## 2023-06-16 ENCOUNTER — Other Ambulatory Visit: Payer: Self-pay | Admitting: Nurse Practitioner

## 2023-06-16 DIAGNOSIS — B3731 Acute candidiasis of vulva and vagina: Secondary | ICD-10-CM

## 2023-06-16 MED ORDER — ESTRADIOL 0.1 MG/GM VA CREA: 1.0000 | TOPICAL_CREAM | VAGINAL | 12 refills | Status: DC

## 2023-06-16 MED ORDER — FLUCONAZOLE 150 MG PO TABS
150.0000 mg | ORAL_TABLET | ORAL | 2 refills | Status: DC
Start: 2023-06-16 — End: 2023-10-10

## 2023-07-01 ENCOUNTER — Telehealth: Payer: Self-pay

## 2023-07-01 NOTE — Telephone Encounter (Signed)
Returned Pt's call regarding vaginal itching/dryness. Pt states that estrace cream has not improved. Pt denies fever, rash, odor, dysuria, or discharge. Pt being treated with diflucan from gynecologist. Advised Pt to hold tamoxifen for 2 weeks to see if sx improve and to continue to follow up with gynecologist. Pt verbalized understanding.

## 2023-07-11 ENCOUNTER — Encounter: Payer: Medicare HMO | Admitting: Adult Health

## 2023-07-11 ENCOUNTER — Encounter: Payer: Self-pay | Admitting: *Deleted

## 2023-07-11 ENCOUNTER — Telehealth: Payer: Self-pay | Admitting: *Deleted

## 2023-07-11 NOTE — Telephone Encounter (Signed)
This RN spoke with pt per her call inquiring about next step since she has held the tamoxifen x 2 weeks for noted vaginal dryness and itching.  She states she held the tamoxifen and at the same time started the diflucan with symptoms now all cleared up.  She states she was put on the 10 mg tamoxifen in April 2024 with onset of vaginal changes occurring within 3 weeks. She was using the estrace cream twice a week as indicated and did not notice any improvement.  Improvement with symptoms occurred post stopping the tamoxifen and using the diflucan.  Above will be reviewed with MD for further recommendations.

## 2023-07-15 ENCOUNTER — Telehealth: Payer: Self-pay

## 2023-07-15 NOTE — Telephone Encounter (Signed)
Received VM from pt at 4:22 PM asking for advice regarding Tamoxifen per conversation with Milton Ferguson, RN. This information was given to [facility] for f/u and she states she will call pt 7/31 to f/u with MD recommendations.

## 2023-07-17 ENCOUNTER — Telehealth: Payer: Self-pay | Admitting: *Deleted

## 2023-07-17 NOTE — Telephone Encounter (Signed)
This RN spoke with the patient per MD review with recommendation for pt to restart the tamoxifen at 5 mg every other day x 2 weeks then increase to daily.  Discussed also need to be proactive with vaginal dryness and to start use of the estrace cream- to to noted "burning" sensation with prior use - which pt also had a yeast infection- pt should dab a small amount to an outside area of the labia - and if no issues proceed with use.  Pt did not have an MD appt- appt made for early October.  Pt understands to call if needed.

## 2023-09-19 ENCOUNTER — Inpatient Hospital Stay: Payer: Medicare HMO | Admitting: Hematology and Oncology

## 2023-09-19 NOTE — Assessment & Plan Note (Deleted)
01/13/2023:Screening mammogram detected right breast mass 1.6 x 0.5 cm at 3 o'clock position biopsy revealed high-grade DCIS solid and cribriform features ER 40%, PR 0% 01/30/2023:Right lumpectomy: High-grade DCIS with necrosis 1 cm, no evidence of invasive cancer, margins negative, ER 40%, PR 0% 03/14/2023-04/09/2023: Adjuvant radiation   Treatment plan: Adjuvant tamoxifen 10 mg daily x 5 years (lower dose being used based on TAM-01 clinical trial showing lower doses of tamoxifen even as low as 5 mg were beneficial)  Tamoxifen toxicities:  Breast cancer surveillance: Mammograms to be done in January 2025. Return to clinic in 1 year for follow-up

## 2023-09-21 ENCOUNTER — Telehealth: Payer: Medicare HMO | Admitting: Physician Assistant

## 2023-09-21 DIAGNOSIS — J069 Acute upper respiratory infection, unspecified: Secondary | ICD-10-CM

## 2023-09-21 DIAGNOSIS — B9689 Other specified bacterial agents as the cause of diseases classified elsewhere: Secondary | ICD-10-CM

## 2023-09-21 MED ORDER — DOXYCYCLINE HYCLATE 100 MG PO TABS
100.0000 mg | ORAL_TABLET | Freq: Two times a day (BID) | ORAL | 0 refills | Status: DC
Start: 2023-09-21 — End: 2023-10-31

## 2023-09-21 MED ORDER — PROMETHAZINE-DM 6.25-15 MG/5ML PO SYRP
5.0000 mL | ORAL_SOLUTION | Freq: Four times a day (QID) | ORAL | 0 refills | Status: DC | PRN
Start: 2023-09-21 — End: 2023-10-31

## 2023-09-21 NOTE — Patient Instructions (Signed)
Westley Hummer Norton, thank you for joining [name], PA-C for today's virtual visit.  While this provider is not your primary care provider (PCP), if your PCP is located in our provider database this encounter information will be shared with them immediately following your visit.   A [facility] MyChart account gives you access to today's visit and all your visits, tests, and labs performed at [facility] " click here if you don't have a [facility] MyChart account or go to [email or web address]  Consent: (Patient) Debra Norton provided verbal consent for this virtual visit at the beginning of the encounter.  Current Medications:  Current Outpatient Medications:    doxycycline (VIBRA-TABS) 100 MG tablet, Take 1 tablet (100 mg total) by mouth 2 (two) times daily., Disp: 20 tablet, Rfl: 0   estradiol (ESTRACE VAGINAL) 0.1 MG/GM vaginal cream, Place 1 Applicatorful vaginally 2 (two) times a week., Disp: 42.5 g, Rfl: 12   promethazine-dextromethorphan (PROMETHAZINE-DM) 6.25-15 MG/5ML syrup, Take 5 mLs by mouth 4 (four) times daily as needed., Disp: 118 mL, Rfl: 0   ALPRAZolam (XANAX) 0.25 MG tablet, Take 1 tablet (0.25 mg total) by mouth at bedtime as needed for anxiety., Disp: 30 tablet, Rfl: 0   fluconazole (DIFLUCAN) 150 MG tablet, Take 1 tablet (150 mg total) by mouth every 3 (three) days., Disp: 2 tablet, Rfl: 2   tamoxifen (NOLVADEX) 10 MG tablet, Take 0.5 tablets (5 mg total) by mouth daily., Disp: 90 tablet, Rfl: 1   Medications ordered in this encounter:  Meds ordered this encounter  Medications   doxycycline (VIBRA-TABS) 100 MG tablet    Sig: Take 1 tablet (100 mg total) by mouth 2 (two) times daily.    Dispense:  20 tablet    Refill:  0    Order Specific Question:   Supervising Provider    Answer:   Merrilee Jansky [id number]   promethazine-dextromethorphan (PROMETHAZINE-DM) 6.25-15 MG/5ML syrup    Sig: Take 5 mLs by mouth 4 (four) times  daily as needed.    Dispense:  118 mL    Refill:  0    Order Specific Question:   Supervising Provider    Answer:   Merrilee Jansky [[id number]]     *If you need refills on other medications prior to your next appointment, please contact your pharmacy*  Follow-Up: Call back or seek an in-person evaluation if the symptoms worsen or if the condition fails to improve as anticipated.  [facility] [phone]  Other Instructions Sinus Infection, Adult A sinus infection, also called sinusitis, is inflammation of your sinuses. Sinuses are hollow spaces in the bones around your face. Your sinuses are located: Around your eyes. In the middle of your forehead. Behind your nose. In your cheekbones. Mucus normally drains out of your sinuses. When your nasal tissues become inflamed or swollen, mucus can become trapped or blocked. This allows bacteria, viruses, and fungi to grow, which leads to infection. Most infections of the sinuses are caused by a virus. A sinus infection can develop quickly. It can last for up to 4 weeks (acute) or for more than 12 weeks (chronic). A sinus infection often develops after a cold. What are the causes? This condition is caused by anything that creates swelling in the sinuses or stops mucus from draining. This includes: Allergies. Asthma. Infection from bacteria or viruses. Deformities or blockages in your nose or sinuses. Abnormal growths in the nose (nasal polyps). Pollutants, such as chemicals or irritants  in the air. Infection from fungi. This is rare. What increases the risk? You are more likely to develop this condition if you: Have a weak body defense system (immune system). Do a lot of swimming or diving. Overuse nasal sprays. Smoke. What are the signs or symptoms? The main symptoms of this condition are pain and a feeling of pressure around the affected sinuses. Other symptoms include: Stuffy nose or congestion that makes it  difficult to breathe through your nose. Thick yellow or greenish drainage from your nose. Tenderness, swelling, and warmth over the affected sinuses. A cough that may get worse at night. Decreased sense of smell and taste. Extra mucus that collects in the throat or the back of the nose (postnasal drip) causing a sore throat or bad breath. Tiredness (fatigue). Fever. How is this diagnosed? This condition is diagnosed based on: Your symptoms. Your medical history. A physical exam. Tests to find out if your condition is acute or chronic. This may include: Checking your nose for nasal polyps. Viewing your sinuses using a device that has a light (endoscope). Testing for allergies or bacteria. Imaging tests, such as an MRI or CT scan. In rare cases, a bone biopsy may be done to rule out more serious types of fungal sinus disease. How is this treated? Treatment for a sinus infection depends on the cause and whether your condition is chronic or acute. If caused by a virus, your symptoms should go away on their own within 10 days. You may be given medicines to relieve symptoms. They include: Medicines that shrink swollen nasal passages (decongestants). A spray that eases inflammation of the nostrils (topical intranasal corticosteroids). Rinses that help get rid of thick mucus in your nose (nasal saline washes). Medicines that treat allergies (antihistamines). Over-the-counter pain relievers. If caused by bacteria, your health care provider may recommend waiting to see if your symptoms improve. Most bacterial infections will get better without antibiotic medicine. You may be given antibiotics if you have: A severe infection. A weak immune system. If caused by narrow nasal passages or nasal polyps, surgery may be needed. Follow these instructions at home: Medicines Take, use, or apply over-the-counter and prescription medicines only as told by your health care provider. These may include nasal  sprays. If you were prescribed an antibiotic medicine, take it as told by your health care provider. Do not stop taking the antibiotic even if you start to feel better. Hydrate and humidify  Drink enough fluid to keep your urine pale yellow. Staying hydrated will help to thin your mucus. Use a cool mist humidifier to keep the humidity level in your home above 50%. Inhale steam for 10-15 minutes, 3-4 times a day, or as told by your health care provider. You can do this in the bathroom while a hot shower is running. Limit your exposure to cool or dry air. Rest Rest as much as possible. Sleep with your head raised (elevated). Make sure you get enough sleep each night. General instructions  Apply a warm, moist washcloth to your face 3-4 times a day or as told by your health care provider. This will help with discomfort. Use nasal saline washes as often as told by your health care provider. Wash your hands often with soap and water to reduce your exposure to germs. If soap and water are not available, use hand sanitizer. Do not smoke. Avoid being around people who are smoking (secondhand smoke). Keep all follow-up visits. This is important. Contact a health care provider  if: You have a fever. Your symptoms get worse. Your symptoms do not improve within 10 days. Get help right away if: You have a severe headache. You have persistent vomiting. You have severe pain or swelling around your face or eyes. You have vision problems. You develop confusion. Your neck is stiff. You have trouble breathing. These symptoms may be an emergency. Get help right away. Call 911. Do not wait to see if the symptoms will go away. Do not drive yourself to the hospital. Summary A sinus infection is soreness and inflammation of your sinuses. Sinuses are hollow spaces in the bones around your face. This condition is caused by nasal tissues that become inflamed or swollen. The swelling traps or blocks the flow  of mucus. This allows bacteria, viruses, and fungi to grow, which leads to infection. If you were prescribed an antibiotic medicine, take it as told by your health care provider. Do not stop taking the antibiotic even if you start to feel better. Keep all follow-up visits. This is important. This information is not intended to replace advice given to you by your health care provider. Make sure you discuss any questions you have with your health care provider. Document Revised: 11/06/2021 Document Reviewed: 11/06/2021 Elsevier Patient Education  2024 Elsevier [employer].    If you have been instructed to have an in-person evaluation today at a local Urgent Care facility, please use the link below. It will take you to a list of all of our available [facility], including address, phone number and hours of operation. Please do not delay care.  [facility]  If you or a family member do not have a primary care provider, use the link below to schedule a visit and establish care. When you choose a [facility] primary care physician or advanced practice provider, you gain a long-term partner in health. Find a Primary Care Provider  Learn more about [facility]'s in-office and virtual care options:  - Get Care Now

## 2023-09-21 NOTE — Progress Notes (Signed)
Virtual Visit Consent   Debra Norton, you are scheduled for a virtual visit with a [facility] provider today. Just as with appointments in the office, your consent must be obtained to participate. Your consent will be active for this visit and any virtual visit you may have with one of our providers in the next 365 days. If you have a MyChart account, a copy of this consent can be sent to you electronically.  As this is a virtual visit, video technology does not allow for your provider to perform a traditional examination. This may limit your provider's ability to fully assess your condition. If your provider identifies any concerns that need to be evaluated in person or the need to arrange testing (such as labs, EKG, etc.), we will make arrangements to do so. Although advances in technology are sophisticated, we cannot ensure that it will always work on either your end or our end. If the connection with a video visit is poor, the visit may have to be switched to a telephone visit. With either a video or telephone visit, we are not always able to ensure that we have a secure connection.  By engaging in this virtual visit, you consent to the provision of healthcare and authorize for your insurance to be billed (if applicable) for the services provided during this visit. Depending on your insurance coverage, you may receive a charge related to this service.  I need to obtain your verbal consent now. Are you willing to proceed with your visit today? Debra Norton has provided verbal consent on 09/21/2023 for a virtual visit (video or telephone). [name], PA-C  Date: 09/21/2023 11:07 AM  Virtual Visit via Video Note   I, [name], connected with  Debra Norton  ([id number], October 24, 1956) on 09/21/23 at 11:00 AM EDT by a video-enabled telemedicine application and verified that I am speaking with the correct person using two identifiers.  Location: Patient:  Virtual Visit Location Patient: Home Provider: Virtual Visit Location Provider: Home Office   I discussed the limitations of evaluation and management by telemedicine and the availability of in person appointments. The patient expressed understanding and agreed to proceed.    History of Present Illness: Debra Norton is a 67 y.o. who identifies as a female who was assigned female at birth, and is being seen today for URI symptoms.  HPI: URI  This is a new problem. The current episode started in the past 7 days. The problem has been gradually worsening. Maximum temperature: 100. The fever has been present for Less than 1 day. Associated symptoms include congestion, coughing (dry), ear pain (bilaterally), headaches, rhinorrhea, sinus pain and a sore throat. Pertinent negatives include no diarrhea, nausea, plugged ear sensation, vomiting or wheezing. Treatments tried: nyquil, dayquil, elderberry. The treatment provided no relief.     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   PCP NOTES >>>>>>>>>>>>> 07/30/2017   Annual physical exam 06/28/2015   Allergic reaction 05/11/2015   Dizziness and giddiness 05/24/2014   OSA (obstructive sleep apnea) 05/24/2014   Cervical dysplasia    GERD 07/05/2010  History of colonic polyps 07/05/2010   Migraine headache 05/08/2007    Allergies:  Allergies  Allergen Reactions   Diphenhydramine Hcl     VERY WIRED/palpitations  Benadryl   Flagyl [Metronidazole] Nausea And Vomiting   Penicillins Hives   Tetanus Toxoid     REALLY BAD SWELLING at the injection site    Clindamycin/Lincomycin Rash    Rash shortly after she finished a round of abx , see OV note 05-10-15   Hydrocodone     Insomnia, itching (mild)   Medications:  Current Outpatient Medications:    doxycycline (VIBRA-TABS) 100 MG tablet, Take 1 tablet (100 mg total) by mouth 2 (two) times daily., Disp: 20 tablet, Rfl: 0   estradiol (ESTRACE VAGINAL) 0.1 MG/GM vaginal cream, Place 1 Applicatorful vaginally 2 (two) times a week., Disp: 42.5 g, Rfl: 12   promethazine-dextromethorphan (PROMETHAZINE-DM) 6.25-15 MG/5ML syrup, Take 5 mLs by mouth 4 (four) times daily as needed., Disp: 118 mL, Rfl: 0   ALPRAZolam (XANAX) 0.25 MG tablet, Take 1 tablet (0.25 mg total) by mouth at bedtime as needed for anxiety., Disp: 30 tablet, Rfl: 0   fluconazole (DIFLUCAN) 150 MG tablet, Take 1 tablet (150 mg total) by mouth every 3 (three) days., Disp: 2 tablet, Rfl: 2   tamoxifen (NOLVADEX) 10 MG tablet, Take 0.5 tablets (5 mg total) by mouth daily., Disp: 90 tablet, Rfl: 1  Observations/Objective: Patient is well-developed, well-nourished in no acute distress.  Resting comfortably at home.  Head is normocephalic, atraumatic.  No labored breathing.  Speech is clear and coherent with logical content.  Patient is alert and oriented at baseline.    Assessment and Plan: 1. Bacterial upper respiratory infection - doxycycline (VIBRA-TABS) 100 MG tablet; Take 1 tablet (100 mg total) by mouth 2 (two) times daily.  Dispense: 20 tablet; Refill: 0 - promethazine-dextromethorphan (PROMETHAZINE-DM) 6.25-15 MG/5ML syrup; Take 5 mLs by mouth 4 (four) times daily as needed.  Dispense: 118 mL; Refill: 0  - Worsening symptoms that have not responded to OTC medications.  - Will give Doxycycline and Promethazine DM - Continue allergy medications.  - Steam and humidifier can help - Stay well hydrated and get plenty of rest.  - Seek in person evaluation if no symptom improvement or if symptoms worsen   Follow Up  Instructions: I discussed the assessment and treatment plan with the patient. The patient was provided an opportunity to ask questions and all were answered. The patient agreed with the plan and demonstrated an understanding of the instructions.  A copy of instructions were sent to the patient via MyChart unless otherwise noted below.    The patient was advised to call back or seek an in-person evaluation if the symptoms worsen or if the condition fails to improve as anticipated.    [name], PA-C

## 2023-09-22 ENCOUNTER — Encounter: Payer: Self-pay | Admitting: *Deleted

## 2023-09-22 NOTE — Progress Notes (Signed)
Called pt to inquire if she has started back on tamoxifen and she has 2- 3 weeks ago, so far doing good on the 5mg . I told her she needs to be seen in December for a tox check. Pt verbalized understanding and agreed with the plan.

## 2023-09-24 ENCOUNTER — Telehealth: Payer: Self-pay | Admitting: Adult Health

## 2023-09-24 NOTE — Telephone Encounter (Signed)
Patient is aware of survivorship appointment dates/times with Mardella Layman and has confirmed appointment

## 2023-10-01 ENCOUNTER — Telehealth: Payer: Medicare HMO | Admitting: Nurse Practitioner

## 2023-10-01 DIAGNOSIS — J0110 Acute frontal sinusitis, unspecified: Secondary | ICD-10-CM | POA: Diagnosis not present

## 2023-10-01 MED ORDER — PREDNISONE 20 MG PO TABS: 20.0000 mg | ORAL_TABLET | Freq: Two times a day (BID) | ORAL | 0 refills | Status: AC

## 2023-10-01 MED ORDER — IPRATROPIUM BROMIDE 0.03 % NA SOLN
2.0000 | Freq: Two times a day (BID) | NASAL | 12 refills | Status: DC
Start: 2023-10-01 — End: 2023-10-31

## 2023-10-01 NOTE — Progress Notes (Signed)
Virtual Visit Consent   Life Crespo Norton, you are scheduled for a virtual visit with a [facility] provider today. Just as with appointments in the office, your consent must be obtained to participate. Your consent will be active for this visit and any virtual visit you may have with one of our providers in the next 365 days. If you have a MyChart account, a copy of this consent can be sent to you electronically.  As this is a virtual visit, video technology does not allow for your provider to perform a traditional examination. This may limit your provider's ability to fully assess your condition. If your provider identifies any concerns that need to be evaluated in person or the need to arrange testing (such as labs, EKG, etc.), we will make arrangements to do so. Although advances in technology are sophisticated, we cannot ensure that it will always work on either your end or our end. If the connection with a video visit is poor, the visit may have to be switched to a telephone visit. With either a video or telephone visit, we are not always able to ensure that we have a secure connection.  By engaging in this virtual visit, you consent to the provision of healthcare and authorize for your insurance to be billed (if applicable) for the services provided during this visit. Depending on your insurance coverage, you may receive a charge related to this service.  I need to obtain your verbal consent now. Are you willing to proceed with your visit today? Debra Norton has provided verbal consent on 10/01/2023 for a virtual visit (video or telephone). Viviano Simas, FNP  Date: 10/01/2023 3:40 PM  Virtual Visit via Video Note   I, Viviano Simas, connected with  Debra Norton  ([id number], 07/30/1956) on 10/01/23 at  3:45 PM EDT by a video-enabled telemedicine application and verified that I am speaking with the correct person using two identifiers.  Location: Patient: Virtual Visit  Location Patient: Home Provider: Virtual Visit Location Provider: Home Office   I discussed the limitations of evaluation and management by telemedicine and the availability of in person appointments. The patient expressed understanding and agreed to proceed.    History of Present Illness: Debra Norton is a 67 y.o. who identifies as a female who was assigned female at birth, and is being seen today for ongoing URI symptoms.   She was seen on 09/21/23 and treated with doxycyline and promethazine-dm at that time.  She was having sinus congestion cough and a fever at that time.  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   PCP NOTES >>>>>>>>>>>>> 07/30/2017   Annual physical exam 06/28/2015   Allergic reaction 05/11/2015   Dizziness and giddiness 05/24/2014   OSA (obstructive sleep apnea) 05/24/2014   Cervical dysplasia    GERD 07/05/2010   History of colonic polyps 07/05/2010   Migraine headache 05/08/2007    Allergies:  Allergies  Allergen Reactions   Diphenhydramine Hcl  VERY WIRED/palpitations  Benadryl   Flagyl [Metronidazole] Nausea And Vomiting   Penicillins Hives   Tetanus Toxoid     REALLY BAD SWELLING at the injection site   Clindamycin/Lincomycin Rash    Rash shortly after she finished a round of abx , see OV note 05-10-15   Hydrocodone     Insomnia, itching (mild)   Medications:  Current Outpatient Medications:    estradiol (ESTRACE VAGINAL) 0.1 MG/GM  vaginal cream, Place 1 Applicatorful vaginally 2 (two) times a week., Disp: 42.5 g, Rfl: 12   ALPRAZolam (XANAX) 0.25 MG tablet, Take 1 tablet (0.25 mg total) by mouth at bedtime as needed for anxiety., Disp: 30 tablet, Rfl: 0   doxycycline (VIBRA-TABS) 100 MG tablet, Take 1 tablet (100 mg total) by mouth 2 (two) times daily., Disp: 20 tablet, Rfl: 0   fluconazole (DIFLUCAN) 150 MG tablet, Take 1 tablet (150 mg total) by mouth every 3 (three) days., Disp: 2 tablet, Rfl: 2   promethazine-dextromethorphan (PROMETHAZINE-DM) 6.25-15 MG/5ML syrup, Take 5 mLs by mouth 4 (four) times daily as needed., Disp: 118 mL, Rfl: 0   tamoxifen (NOLVADEX) 10 MG tablet, Take 0.5 tablets (5 mg total) by mouth daily., Disp: 90 tablet, Rfl: 1  Observations/Objective: Patient is well-developed, well-nourished in no acute distress.  Resting comfortably  at home.  Head is normocephalic, atraumatic.  No labored breathing.  Speech is clear and coherent with logical content.  Patient is alert and oriented at baseline.    Assessment and Plan:  1. Acute non-recurrent frontal sinusitis  Likely rebound congestion  - ipratropium (ATROVENT) 0.03 % nasal spray; Place 2 sprays into both nostrils every 12 (twelve) hours.  Dispense: 30 mL; Refill: 12 - predniSONE (DELTASONE) 20 MG tablet; Take 1 tablet (20 mg total) by mouth 2 (two) times daily with a meal for 5 days.  Dispense: 10 tablet; Refill: 0     Follow Up Instructions: I discussed the assessment and treatment plan with the patient. The patient was provided an opportunity to ask questions and all were answered. The patient agreed with the plan and demonstrated an understanding of the instructions.  A copy of instructions were sent to the patient via MyChart unless otherwise noted below.    The patient was advised to call back or seek an in-person evaluation if the symptoms worsen or if the condition fails to improve as anticipated.    Viviano Simas, FNP

## 2023-10-10 ENCOUNTER — Ambulatory Visit (INDEPENDENT_AMBULATORY_CARE_PROVIDER_SITE_OTHER): Payer: Medicare HMO

## 2023-10-10 ENCOUNTER — Ambulatory Visit (INDEPENDENT_AMBULATORY_CARE_PROVIDER_SITE_OTHER): Payer: Medicare HMO | Admitting: Podiatry

## 2023-10-10 VITALS — Ht 62.0 in | Wt 150.0 lb

## 2023-10-10 DIAGNOSIS — M79672 Pain in left foot: Secondary | ICD-10-CM | POA: Diagnosis not present

## 2023-10-10 DIAGNOSIS — M778 Other enthesopathies, not elsewhere classified: Secondary | ICD-10-CM

## 2023-10-10 MED ORDER — CLOTRIMAZOLE-BETAMETHASONE 1-0.05 % EX CREA: 1.0000 | TOPICAL_CREAM | Freq: Every day | CUTANEOUS | 0 refills | Status: DC

## 2023-10-10 NOTE — Progress Notes (Unsigned)
Subjective:   Patient ID: Debra Norton, female   DOB: 67 y.o.   MRN: [id number]   HPI Chief Complaint  Patient presents with   Foot Problem    Bump on left foot, outer area near small toe. Occurred after walking [place] miles [place] the beach about 2-3 weeks. She was wearing shoes most of the time.  Also c/o possible blister between 4th & 5th toe on both feet.     Few weeks, no injuries. [place] and walked 20 mils. No treament.    ROS      Objective:  Physical Exam  ***     Assessment:  ***     Plan:  ***

## 2023-10-10 NOTE — Patient Instructions (Signed)

## 2023-10-22 ENCOUNTER — Encounter: Payer: Self-pay | Admitting: Gastroenterology

## 2023-10-25 ENCOUNTER — Encounter (INDEPENDENT_AMBULATORY_CARE_PROVIDER_SITE_OTHER): Payer: Self-pay

## 2023-10-29 ENCOUNTER — Telehealth: Payer: Self-pay

## 2023-10-29 NOTE — Telephone Encounter (Signed)
Initial Comment Caller needs to schedule an appt, she is experiencing dizziness. Translation No Nurse Assessment Nurse: Delford Field, RN, [name] Date/Time Debra Norton Time): 10/29/2023 3:15:26 PM Confirm and document reason for call. If symptomatic, describe symptoms. ---Caller states that she has had dizziness for a number of years, but it is becoming more frequent. Does the patient have any new or worsening symptoms? ---Yes Will a triage be completed? ---Yes Related visit to physician within the last 2 weeks? ---No Does the PT have any chronic conditions? (i.e. diabetes, asthma, this includes High risk factors for pregnancy, etc.) ---No Is this a behavioral health or substance abuse call? ---No Guidelines Guideline Title Affirmed Question Affirmed Notes Nurse Date/Time (Eastern Time) Dizziness - Lightheadedness [1] MILD dizziness (e.g., walking normally) AND [2] has NOT been evaluated by doctor (or NP/PA) for this (Exception: Dizziness caused by heat exposure, sudden standing, or poor fluid intake.) Delford Field, RN, [name] 10/29/2023 3:16:15 PM PLEASE NOTE: All timestamps contained within this report are represented as [place] Standard Time. CONFIDENTIALTY NOTICE: This fax transmission is intended only for the addressee. It contains information that is legally privileged, confidential or otherwise protected from use or disclosure. If you are not the intended recipient, you are strictly prohibited from reviewing, disclosing, copying using or disseminating any of this information or taking any action in reliance on or regarding this information. If you have received this fax in error, please notify us immediately by telephone so that we can arrange for its return to [place]. Phone: [phone], Toll-Free: [phone], Fax: [phone] Page: 2 of 2 Call Id: [id number] Disp. Time Debra Norton Time) Disposition Final User 10/29/2023 3:03:00 PM Attempt made - message left [name] 10/29/2023 3:18:56 PM SEE PCP WITHIN 3 DAYS Yes Delford Field RN, [name] Final Disposition 10/29/2023 3:18:56 PM SEE PCP WITHIN 3 DAYS Yes Delford Field, RN, [name] Disagree/Comply Comply Caller Understands Yes PreDisposition Call Doctor Care Advice Given Per Guideline SEE PCP WITHIN 3 DAYS: * You need to be seen within 2 or 3 days. DRINK FLUIDS: CALL BACK IF: * You pass out (faint) or are too weak to stand * You become worse CARE ADVICE given per Dizziness - Lightheadedness (Adult) guideline

## 2023-10-29 NOTE — Telephone Encounter (Signed)
 Patient scheduled 10/31/2023

## 2023-10-31 ENCOUNTER — Ambulatory Visit (INDEPENDENT_AMBULATORY_CARE_PROVIDER_SITE_OTHER): Payer: Medicare HMO | Admitting: Family Medicine

## 2023-10-31 ENCOUNTER — Encounter: Payer: Self-pay | Admitting: Family Medicine

## 2023-10-31 ENCOUNTER — Inpatient Hospital Stay: Payer: Medicare HMO | Attending: Hematology and Oncology | Admitting: Hematology and Oncology

## 2023-10-31 VITALS — BP 120/55 | HR 73 | Temp 97.6°F | Resp 18 | Ht 62.0 in | Wt 153.1 lb

## 2023-10-31 DIAGNOSIS — Z131 Encounter for screening for diabetes mellitus: Secondary | ICD-10-CM

## 2023-10-31 DIAGNOSIS — R251 Tremor, unspecified: Secondary | ICD-10-CM

## 2023-10-31 DIAGNOSIS — H539 Unspecified visual disturbance: Secondary | ICD-10-CM | POA: Diagnosis not present

## 2023-10-31 DIAGNOSIS — R5383 Other fatigue: Secondary | ICD-10-CM

## 2023-10-31 DIAGNOSIS — Z79810 Long term (current) use of selective estrogen receptor modulators (SERMs): Secondary | ICD-10-CM | POA: Diagnosis not present

## 2023-10-31 DIAGNOSIS — D0511 Intraductal carcinoma in situ of right breast: Secondary | ICD-10-CM | POA: Insufficient documentation

## 2023-10-31 DIAGNOSIS — Z923 Personal history of irradiation: Secondary | ICD-10-CM | POA: Diagnosis not present

## 2023-10-31 LAB — IBC + FERRITIN
Ferritin: 80.6 ng/mL (ref 10.0–291.0)
Iron: 91 ug/dL (ref 42–145)
Saturation Ratios: 28.1 % (ref 20.0–50.0)
TIBC: 323.4 ug/dL (ref 250.0–450.0)
Transferrin: 231 mg/dL (ref 212.0–360.0)

## 2023-10-31 LAB — COMPREHENSIVE METABOLIC PANEL
ALT: 14 U/L (ref 0–35)
AST: 21 U/L (ref 0–37)
Albumin: 4 g/dL (ref 3.5–5.2)
Alkaline Phosphatase: 52 U/L (ref 39–117)
BUN: 13 mg/dL (ref 6–23)
CO2: 27 meq/L (ref 19–32)
Calcium: 9.1 mg/dL (ref 8.4–10.5)
Chloride: 107 meq/L (ref 96–112)
Creatinine, Ser: 0.86 mg/dL (ref 0.40–1.20)
GFR: 70.56 mL/min (ref >60.00)
Glucose, Bld: 95 mg/dL (ref 70–99)
Potassium: 4.2 meq/L (ref 3.5–5.1)
Sodium: 141 meq/L (ref 135–145)
Total Bilirubin: 0.4 mg/dL (ref 0.2–1.2)
Total Protein: 6.4 g/dL (ref 6.0–8.3)

## 2023-10-31 LAB — CBC WITH DIFFERENTIAL/PLATELET
Basophils Absolute: 0 10*3/uL (ref 0.0–0.1)
Basophils Relative: 0.9 % (ref 0.0–3.0)
Eosinophils Absolute: 0.1 10*3/uL (ref 0.0–0.7)
Eosinophils Relative: 1.7 % (ref 0.0–5.0)
HCT: 39.8 % (ref 36.0–46.0)
Hemoglobin: 13.1 g/dL (ref 12.0–15.0)
Lymphocytes Relative: 29.6 % (ref 12.0–46.0)
Lymphs Abs: 1.6 10*3/uL (ref 0.7–4.0)
MCHC: 33.1 g/dL (ref 30.0–36.0)
MCV: 92.1 fL (ref 78.0–100.0)
Monocytes Absolute: 0.4 10*3/uL (ref 0.1–1.0)
Monocytes Relative: 6.8 % (ref 3.0–12.0)
Neutro Abs: 3.3 10*3/uL (ref 1.4–7.7)
Neutrophils Relative %: 61 % (ref 43.0–77.0)
Platelets: 268 10*3/uL (ref 150.0–400.0)
RBC: 4.32 Mil/uL (ref 3.87–5.11)
RDW: 13.5 % (ref 11.5–15.5)
WBC: 5.5 10*3/uL (ref 4.0–10.5)

## 2023-10-31 LAB — B12 AND FOLATE PANEL
Folate: 14.9 ng/mL (ref >5.9)
Vitamin B-12: 601 pg/mL (ref 211–911)

## 2023-10-31 LAB — HEMOGLOBIN A1C: Hgb A1c MFr Bld: 5.6 % (ref 4.6–6.5)

## 2023-10-31 LAB — TSH: TSH: 3.84 u[IU]/mL (ref 0.35–5.50)

## 2023-10-31 NOTE — Assessment & Plan Note (Addendum)
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  Denies any hot flashes or muscle cramps.  Breast cancer surveillance: Breast exam 10/31/2023: Benign Mammogram at [facility] benign

## 2023-10-31 NOTE — Progress Notes (Signed)
Acute Office Visit  Subjective:     Patient ID: KAYLANNI MAURER, female    DOB: 1956-10-23, 67 y.o.   MRN: [id number]  Chief Complaint  Patient presents with   Dizziness    Patient is in today for tremors and dizziness/vision change.   Discussed the use of AI scribe software for clinical note transcription with the patient, who gave verbal consent to proceed.  History of Present Illness   The patient presents with intermittent episodes of visual disturbances described as "looking through a fishbowl" and whole-body tremors. The visual disturbances have been occurring approximately once a week for the past few months, with an increase in frequency over the last month or two. During these episodes, the patient does not experience double vision, blackouts, or tunnel vision, but describes the vision as "warped." The patient reports feeling as if they might pass out if they do not eat something during these episodes, although they have not actually lost consciousness.  In addition to the visual disturbances, the patient has been experiencing whole-body tremors, which they describe as a mild shaking, similar to sitting in a massage chair. These tremors have been occurring for a slightly longer duration than the visual disturbances, approximately once a week, and last for about three to five minutes. The patient has not noticed a correlation between the tremors and the visual disturbances.  The patient also reports constant fatigue, and a history of radiation treatment that concluded in May. The onset of the tremors may have been shortly after the completion of radiation, but the patient is unsure of the exact timing. The patient denies any other symptoms, including numbness, tingling, chest pain, shortness of breath, headaches, or upper respiratory symptoms.                All review of systems negative except what is listed in the HPI      Objective:    BP (!) 114/52    Pulse 75   Ht 5\' 2"  (1.575 m)   Wt 154 lb (69.9 kg)   SpO2 100%   BMI 28.17 kg/m    Physical Exam Vitals reviewed.  Constitutional:      Appearance: Normal appearance.  HENT:     Head: Normocephalic and atraumatic.     Right Ear: Tympanic membrane normal.     Left Ear: Tympanic membrane normal.  Eyes:     Extraocular Movements: Extraocular movements intact.     Conjunctiva/sclera: Conjunctivae normal.     Pupils: Pupils are equal, round, and reactive to light.  Cardiovascular:     Rate and Rhythm: Normal rate and regular rhythm.  Pulmonary:     Effort: Pulmonary effort is normal.     Breath sounds: Normal breath sounds.  Musculoskeletal:        General: Normal range of motion.     Cervical back: Normal range of motion and neck supple.  Skin:    General: Skin is warm and dry.  Neurological:     General: No focal deficit present.     Mental Status: She is alert and oriented to person, place, and time. Mental status is at baseline.     Cranial Nerves: No cranial nerve deficit.     Motor: No weakness.     Coordination: Coordination normal.     Gait: Gait normal.  Psychiatric:        Mood and Affect: Mood normal.        Behavior: Behavior normal.  Thought Content: Thought content normal.        Judgment: Judgment normal.        No results found for any visits on 10/31/23.      Assessment & Plan:   Problem List Items Addressed This Visit   None Visit Diagnoses     Tremor, unspecified       Relevant Orders   CBC with Differential/Platelet   Comprehensive metabolic panel   Hemoglobin A1c   TSH   B12 and Folate Panel   IBC + Ferritin   Ambulatory referral to Neurology   Fatigue, unspecified type       Relevant Orders   CBC with Differential/Platelet   Comprehensive metabolic panel   Hemoglobin A1c   TSH   B12 and Folate Panel   IBC + Ferritin   Ambulatory referral to Neurology   Vision changes       Relevant Orders   CBC with  Differential/Platelet   Comprehensive metabolic panel   Hemoglobin A1c   TSH   B12 and Folate Panel   IBC + Ferritin   Ambulatory referral to Neurology   Screening for diabetes mellitus       Relevant Orders   Hemoglobin A1c         Visual Disturbances Reports of episodic, transient, warped vision occurring approximately once a week for the past few months. No associated headache, dizziness, or double vision. No recent changes in baseline vision. Recent eye exam was normal. -Advise to keep a symptom diary noting time of day, duration, associated symptoms, and any potential triggers (e.g., time since last meal). -Advise to return to eye doctor if vision changes. -Order basic labs to rule out metabolic causes.  Tremors Reports of whole-body tremors occurring approximately once a week for more than a couple of months. No associated weakness or numbness. -Refer to neurology for further evaluation.  Fatigue Reports of chronic fatigue, possibly improved with vitamin intake. -Check labs today -Discussed healthy diet, adequate sleep, and regular exercise        No orders of the defined types were placed in this encounter.   Return if symptoms worsen or fail to improve.  Clayborne Dana, NP

## 2023-10-31 NOTE — Progress Notes (Signed)
Patient Care Team: Clayborne Dana, NP as PCP - General (Family Medicine) Harrington Challenger, NP (Inactive) as Nurse Practitioner (Obstetrics and Gynecology) Emelia Loron, MD as Consulting Physician (General Surgery) Almond Lint, MD as Consulting Physician (General Surgery) Serena Croissant, MD as Consulting Physician (Hematology and Oncology) Dorothy Puffer, MD as Consulting Physician (Radiation Oncology) Pershing Proud, RN as Oncology Nurse Navigator Donnelly Angelica, RN as Oncology Nurse Navigator  DIAGNOSIS:  Encounter Diagnosis  Name Primary?   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 Report date is 01/25/2023.  The Common Hereditary Cancers Panel offered by [facility] includes sequencing and/or deletion duplication testing of the following 48 genes: APC, ATM, AXIN2, BAP1, BARD1, BMPR1A, BRCA1, BRCA2, BRIP1, CDH1, CDK4, CDKN2A (p14ARF and p16INK4a only), CHEK2, CTNNA1, DICER1, EPCAM (Deletion/duplication testing only), FH, GREM1 (promoter region duplication testing only), HOXB13, KIT, MBD4, MEN1, MLH1, MSH2, MSH3, MSH6, MUTYH, NF1, NHTL1, PALB2, PDGFRA,  PMS2, POLD1, POLE, PTEN, RAD51C, RAD51D, SDHA (sequencing analysis only except exon 14), SDHB, SDHC, SDHD, SMAD4, SMARCA4. STK11, TP53, TSC1, TSC2, and VHL.   01/30/2023 Surgery   Right lumpectomy: High-grade DCIS with necrosis 1 cm, no evidence of invasive cancer, margins negative, ER 40%, PR 0%     CHIEF COMPLIANT: Follow-up on tamoxifen therapy  HISTORY OF PRESENT ILLNESS:  History of Present Illness   Debra Norton, a patient with a history of breast cancer, presents for a follow-up visit. She has been taking tamoxifen, but initially experienced some side effects including a burning, itching sensation in the vaginal area. She initially thought it could be a reaction to soap, but after discontinuing soap use and applying coconut oil, the symptoms have improved. She also tried a cream, but it seemed to worsen the symptoms.  She has been taking half a dose of tamoxifen daily, most days. She has not experienced hot flashes or muscle cramps. However, she has noticed a sensation like pulling a muscle when she moves her arm, but she is able to move it without restriction.  She also mentions that she has been trying to lose weight by cutting calories and exercising, but has been struggling to lose belly fat.         ALLERGIES:  is allergic to diphenhydramine hcl, flagyl [metronidazole], penicillins, tetanus toxoid, clindamycin/lincomycin, and hydrocodone.  MEDICATIONS:  Current Outpatient Medications  Medication Sig Dispense Refill   ALPRAZolam (XANAX) 0.25 MG tablet Take 1 tablet (0.25 mg total) by mouth at bedtime as needed for anxiety. 30 tablet 0   clotrimazole-betamethasone (LOTRISONE) cream Apply 1 Application topically daily. 30 g 0   tamoxifen (NOLVADEX) 10 MG tablet Take 0.5 tablets (5 mg total) by mouth daily. 90 tablet 1  No current facility-administered medications for this visit.    PHYSICAL EXAMINATION: ECOG PERFORMANCE STATUS: 1 - Symptomatic but completely ambulatory  Vitals:    10/31/23 1039  BP: (!) 120/55  Pulse: 73  Resp: 18  Temp: 97.6 F (36.4 C)  SpO2: 100%   Filed Weights   10/31/23 1039  Weight: 153 lb 1.6 oz (69.4 kg)    Physical Exam benign        (exam performed in the presence of a chaperone)  LABORATORY DATA:  I have reviewed the data as listed    Latest Ref Rng & Units 01/24/2023    3:50 PM 01/15/2023   12:01 PM 12/11/2022    9:50 AM  CMP  Glucose 70 - 99 mg/dL 161  92  86   BUN 8 - 23 mg/dL 14  14  11    Creatinine 0.44 - 1.00 mg/dL 0.96  0.45  4.09   Sodium 135 - 145 mmol/L 139  140  140   Potassium 3.5 - 5.1 mmol/L 3.8  3.8  4.2   Chloride 98 - 111 mmol/L 103  105  107   CO2 22 - 32 mmol/L 24  29  26    Calcium 8.9 - 10.3 mg/dL 9.2  9.9  9.6   Total Protein 6.5 - 8.1 g/dL  7.6  6.7   Total Bilirubin 0.3 - 1.2 mg/dL  0.5  0.4   Alkaline Phos 38 - 126 U/L  73  72   AST 15 - 41 U/L  18  19   ALT 0 - 44 U/L  12  14     Lab Results  Component Value Date   WBC 9.0 01/24/2023   HGB 13.4 01/24/2023   HCT 39.6 01/24/2023   MCV 90.0 01/24/2023   PLT 304 01/24/2023   NEUTROABS 4.1 01/15/2023    ASSESSMENT & PLAN:  Ductal carcinoma in situ (DCIS) of right breast 01/13/2023:Screening mammogram detected right breast mass 1.6 x 0.5 cm at 3 o'clock position 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  Denies any hot flashes or muscle cramps.  Breast cancer surveillance: Breast exam 10/31/2023: Benign Mammogram at [facility] benign ------------------------------------- Assessment and Plan     Breast Cancer - Post Treatment Patient is tolerating Tamoxifen half-dose daily with resolution of previous side effects. No new symptoms reported. -Continue Tamoxifen half-dose daily.  Vaginal Dryness Resolved with use of coconut oil. Previous use of estrogen cream worsened symptoms. -Continue use of coconut oil as needed.  Muscle Tightness Patient reports muscle tightness in arm, not related to surgical incision. No limitations in movement. -Consider physical therapy or exercises for muscle tightness if it persists or worsens.  Weight Management Patient expressed desire to lose weight. -Encouraged regular exercise, including core strengthening and possibly hiking. -Consider consultation with a personal trainer for personalized exercise plan.  General Health Maintenance -Schedule mammogram for first or second week of January 2025. -Follow-up appointment in one year.          Orders Placed This Encounter  Procedures   MM DIAG BREAST TOMO BILATERAL    Standing  Status:   Future    Standing Expiration Date:   10/30/2024    Order Specific Question:   Reason for Exam (SYMPTOM  OR DIAGNOSIS REQUIRED)    Answer:   Annual mammograms with H/O DCIS    Order Specific Question:   Preferred imaging location?    Answer:   External    Comments:   [facility]    Order Specific Question:   Release to patient    Answer:   Immediate   The patient has a good understanding of the overall plan. she agrees with it. she will call with any problems that may develop before the next visit here. Total time spent: 30 mins including face to face time and time spent for planning, charting and co-ordination of care   Tamsen Meek, MD 10/31/23

## 2023-11-11 ENCOUNTER — Ambulatory Visit (INDEPENDENT_AMBULATORY_CARE_PROVIDER_SITE_OTHER): Payer: Medicare HMO

## 2023-11-11 VITALS — Ht 62.0 in | Wt 150.0 lb

## 2023-11-11 DIAGNOSIS — Z0000 Encounter for general adult medical examination without abnormal findings: Secondary | ICD-10-CM

## 2023-11-11 NOTE — Progress Notes (Signed)
 Because this visit was a virtual/telehealth visit,  certain criteria was not obtained, such a blood pressure, CBG if applicable, and timed get up and go. Any medications not marked as "taking" were not mentioned during the medication reconciliation part of the visit. Any vitals not documented were not able to be obtained due to this being a telehealth visit or patient was unable to self-report a recent blood pressure reading due to a lack of equipment at home via telehealth. Vitals that have been documented are verbally provided by the patient.   Subjective:   Debra Norton is a 67 y.o. female who presents for Medicare Annual (Subsequent) preventive examination.  Visit Complete: Virtual I connected with  Leianna Fujii Forte-Leroux on 11/11/23 by a audio enabled telemedicine application and verified that I am speaking with the correct person using two identifiers.  Patient Location: Home  Provider Location: Home Office  I discussed the limitations of evaluation and management by telemedicine. The patient expressed understanding and agreed to proceed.  Vital Signs: Because this visit was a virtual/telehealth visit, some criteria may be missing or patient reported. Any vitals not documented were not able to be obtained and vitals that have been documented are patient reported.  Patient Medicare AWV questionnaire was completed by the patient on 11/04/2023; I have confirmed that all information answered by patient is correct and no changes since this date.  Cardiac Risk Factors include: advanced age (>32men, >84 women)     Objective:    Today's Vitals   11/11/23 0836  Weight: 150 lb (68 kg)  Height: 5\' 2"  (1.575 m)   Body mass index is 27.44 kg/m.     11/11/2023    8:36 AM 02/27/2023   12:27 PM 02/13/2023    2:57 PM 01/24/2023    3:31 PM 10/17/2021   11:43 AM 09/20/2018    9:54 AM 09/16/2018    4:51 PM  Advanced Directives  Does Patient Have a Medical Advance Directive? No No No No  No No No  Would patient like information on creating a medical advance directive? No - Patient declined  Yes (MAU/Ambulatory/Procedural Areas - Information given) Yes (MAU/Ambulatory/Procedural Areas - Information given)  No - Patient declined     Current Medications (verified) Outpatient Encounter Medications as of 11/11/2023  Medication Sig   ALPRAZolam (XANAX) 0.25 MG tablet Take 1 tablet (0.25 mg total) by mouth at bedtime as needed for anxiety.   tamoxifen (NOLVADEX) 10 MG tablet Take 0.5 tablets (5 mg total) by mouth daily.   clotrimazole-betamethasone (LOTRISONE) cream Apply 1 Application topically daily.   No facility-administered encounter medications on file as of 11/11/2023.    Allergies (verified) Diphenhydramine hcl, Flagyl [metronidazole], Penicillins, Tetanus toxoid, Clindamycin/lincomycin, and Hydrocodone   History: Past Medical History:  Diagnosis Date   Allergy    Anxiety    Arthritis    Cancer (HCC)    Cervical dysplasia    Cholelithiasis    14mm stone seen on ultrasound in 2009   Colon polyp    Complication of anesthesia    pain was not controlled in PACU- had to be admitted   Depression    Diverticulitis    Migraine headache    years ago   Sleep apnea    Vertigo    Past Surgical History:  Procedure Laterality Date   BREAST LUMPECTOMY WITH RADIOACTIVE SEED LOCALIZATION Right 01/30/2023   Procedure: RIGHT BREAST LUMPECTOMY WITH RADIOACTIVE SEED LOCALIZATION;  Surgeon: Almond Lint, MD;  Location: [facility] OR;  Service: General;  Laterality: Right;   BREAST SURGERY     CHOLECYSTECTOMY     COLPOSCOPY     ECTOPIC PREGNANCY SURGERY     EXTERNAL EAR SURGERY Left    EYE SURGERY     GYNECOLOGIC CRYOSURGERY     Family History  Problem Relation Age of Onset   Colon cancer Mother 57 - 44   Anxiety disorder Mother    Asthma Mother    Cancer Mother    CAD Father        36   Diabetes Sister    Early death Sister    Ovarian cancer Cousin        maternal  first cousin   Breast cancer Neg Hx    Esophageal cancer Neg Hx    Pancreatic cancer Neg Hx    Social History   Socioeconomic History   Marital status: Widowed    Spouse name: Not on file   Number of children: 0   Years of education: Not on file   Highest education level: Not on file  Occupational History   Occupation: [employer], 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  Tobacco Use   Smoking status: Former    Current packs/day: 0.00    Types: Cigarettes    Start date: 75    Quit date: 05/16/1977    Years since quitting: 46.5   Smokeless tobacco: Never   Tobacco comments:    quit in high School  Vaping Use   Vaping status: Never Used  Substance and Sexual Activity   Alcohol use: Yes    Alcohol/week: 2.0 standard drinks of alcohol    Types: 2 Standard drinks or equivalent per week   Drug use: Not Currently   Sexual activity: Yes    Birth control/protection: None    Comment: INTERCOURSE AGE 15, SEXUAL PARTNERS  MORE THAN 5  Other Topics Concern   Not on file  Social History Narrative   Lives with boy friend   G4 P0    miscarriage x 3    ectopic x 1    Social Determinants of Health   Financial Resource Strain: Low Risk  (11/04/2023)   Overall Financial Resource Strain (CARDIA)    Difficulty of Paying Living Expenses: Not hard at all  Food Insecurity: No Food Insecurity (11/04/2023)   Hunger Vital Sign    Worried About Running Out of Food in the Last Year: Never true    Ran Out of Food in the Last Year: Never true  Transportation Needs: No Transportation Needs (11/04/2023)   PRAPARE - Administrator, Civil Service (Medical): No    Lack of Transportation (Non-Medical): No  Physical Activity: Sufficiently Active (11/04/2023)   Exercise Vital Sign    Days of Exercise per Week: 5 days    Minutes of Exercise per Session: 50 min  Stress: No Stress Concern Present (11/04/2023)   [employer] of Occupational Health - Occupational Stress Questionnaire    Feeling of Stress : Only a little   Social Connections: Moderately Integrated (11/04/2023)   Social Connection and Isolation Panel [NHANES]    Frequency of Communication with Friends and Family: More than three times a week    Frequency of Social Gatherings with Friends and Family: Three times a week    Attends Religious Services: 1 to 4 times per year    Active Member of Clubs or Organizations: No    Attends Banker Meetings: 1 to 4 times per year    Marital Status:  Widowed  Recent Concern: Social Connections - Moderately Isolated (11/04/2023)   Social Connection and Isolation Panel [NHANES]    Frequency of Communication with Friends and Family: More than three times a week    Frequency of Social Gatherings with Friends and Family: Three times a week    Attends Religious Services: Never    Active Member of Clubs or Organizations: No    Attends Banker Meetings: 1 to 4 times per year    Marital Status: Widowed    Tobacco Counseling Counseling given: Yes Tobacco comments: quit in high School   Clinical Intake:  Pre-visit preparation completed: Yes  Pain : No/denies pain     BMI - recorded: 27.44 Nutritional Status: BMI 25 -29 Overweight Nutritional Risks: None Diabetes: No  How often do you need to have someone help you when you read instructions, pamphlets, or other written materials from your doctor or pharmacy?: 1 - Never  Interpreter Needed?: No  Information entered by ::  Jakye Mullens, CMA   Activities of Daily Living    11/04/2023    5:03 PM 01/30/2023    8:14 AM  In your present state of health, do you have any difficulty performing the following activities:  Hearing? 0   Vision? 0   Difficulty concentrating or making decisions? 0   Walking or climbing stairs? 0   Dressing or bathing? 0   Doing errands, shopping? 0 0  Preparing Food and eating ? N   Using the Toilet? N   In the past six months, have you accidently leaked urine? N   Do you have problems with loss  of bowel control? N   Managing your Medications? N   Managing your Finances? N   Housekeeping or managing your Housekeeping? N     Patient Care Team: Clayborne Dana, NP as PCP - General (Family Medicine) Harrington Challenger, NP (Inactive) as Nurse Practitioner (Obstetrics and Gynecology) Emelia Loron, MD as Consulting Physician (General Surgery) Almond Lint, MD as Consulting Physician (General Surgery) Serena Croissant, MD as Consulting Physician (Hematology and Oncology) Dorothy Puffer, MD as Consulting Physician (Radiation Oncology) Pershing Proud, RN as Oncology Nurse Navigator Donnelly Angelica, RN as Oncology Nurse Navigator  Indicate any recent Medical Services you may have received from other than [facility] providers in the past year (date may be approximate).     Assessment:   This is a routine wellness examination for Vester.  Hearing/Vision screen Hearing Screening - Comments:: Patient denies any hearing difficulties.   Vision Screening - Comments:: Up to date with yearly screenings. Sees Dr. Zenaida Niece   Goals Addressed             This Visit's Progress    Patient Stated       Get my house cleaned out.        Depression Screen    11/11/2023    8:37 AM 12/11/2022   10:25 AM 10/12/2021   11:16 AM 01/21/2019    9:26 AM  PHQ 2/9 Scores  PHQ - 2 Score 1 2 0 2  PHQ- 9 Score  9  7    Fall Risk    11/04/2023    5:03 PM 12/11/2022    9:51 AM  Fall Risk   Falls in the past year? 1 0  Number falls in past yr: 0 0  Injury with Fall? 1 0  Risk for fall due to : History of fall(s);Impaired balance/gait   Follow up Education provided;Falls prevention  discussed     MEDICARE RISK AT HOME: Medicare Risk at Home Any stairs in or around the home?: Yes If so, are there any without handrails?: No Home free of loose throw rugs in walkways, pet beds, electrical cords, etc?: Yes Adequate lighting in your home to reduce risk of falls?: Yes Life alert?: No Use of a cane, walker  or w/c?: No Grab bars in the bathroom?: No Shower chair or bench in shower?: No Elevated toilet seat or a handicapped toilet?: No  TIMED UP AND GO:  Was the test performed?  No    Cognitive Function:      07/12/2014    9:06 AM  Montreal Cognitive Assessment   Visuospatial/ Executive (0/5) 5  Naming (0/3) 3  Attention: Read list of digits (0/2) 2  Attention: Read list of letters (0/1) 1  Attention: Serial 7 subtraction starting at 100 (0/3) 3  Language: Repeat phrase (0/2) 1  Language : Fluency (0/1) 1  Abstraction (0/2) 2  Delayed Recall (0/5) 2  Orientation (0/6) 6  Total 26  Adjusted Score (based on education) 26      11/11/2023    8:37 AM  6CIT Screen  What Year? 0 points  What month? 0 points  What time? 0 points  Count back from 20 0 points  Months in reverse 0 points  Repeat phrase 0 points  Total Score 0 points    Immunizations Immunization History  Administered Date(s) Administered   PFIZER(Purple Top)SARS-COV-2 Vaccination 02/23/2020, 03/15/2020, 10/15/2020, 05/23/2021   PNEUMOCOCCAL CONJUGATE-20 12/11/2022   Pfizer Covid-19 Vaccine Bivalent Booster 40yrs & up 12/26/2021    TDAP Status: Patient declined vaccine  Flu Vaccine status: Declined, Education has been provided regarding the importance of this vaccine but patient still declined. Advised may receive this vaccine at local pharmacy or Health Dept. Aware to provide a copy of the vaccination record if obtained from local pharmacy or Health Dept. Verbalized acceptance and understanding.  Pneumococcal vaccine status: Up to date  Covid-19 vaccine status: Declined, Education has been provided regarding the importance of this vaccine but patient still declined. Advised may receive this vaccine at local pharmacy or Health Dept.or vaccine clinic. Aware to provide a copy of the vaccination record if obtained from local pharmacy or Health Dept. Verbalized acceptance and understanding.  Qualifies for Shingles  Vaccine? Yes   Zostavax completed No   Shingrix Completed?: No.    Education has been provided regarding the importance of this vaccine. Patient has been advised to call insurance company to determine out of pocket expense if they have not yet received this vaccine. Advised may also receive vaccine at local pharmacy or Health Dept. Verbalized acceptance and understanding.  Screening Tests Health Maintenance  Topic Date Due   Medicare Annual Wellness (AWV)  Never done   Colonoscopy  11/07/2023   COVID-19 Vaccine (6 - 2023-24 season) 11/16/2023 (Originally 08/17/2023)   Zoster Vaccines- Shingrix (1 of 2) 01/31/2024 (Originally 09/30/1975)   INFLUENZA VACCINE  03/15/2024 (Originally 07/17/2023)   MAMMOGRAM  12/26/2023   DEXA SCAN  02/20/2026   Pneumonia Vaccine 45+ Years old  Completed   Hepatitis C Screening  Completed   HPV VACCINES  Aged Out   DTaP/Tdap/Td  Discontinued    Health Maintenance  Health Maintenance Due  Topic Date Due   Medicare Annual Wellness (AWV)  Never done   Colonoscopy  11/07/2023    Colorectal Cancer Screening: Patient states she just received the letter advising her she is due  for screening. She will call and schedule that appointment  Mammogram status: Ordered 10/31/2023. Pt provided with contact info and advised to call to schedule appt.   Bone Density status: Completed 02/20/2021. Results reflect: Bone density results: NORMAL. Repeat every 2-5 years.  Lung Cancer Screening: (Low Dose CT Chest recommended if Age 38-80 years, 20 pack-year currently smoking OR have quit w/in 15years.) does not qualify.   Lung Cancer Screening Referral: na  Additional Screening:  Hepatitis C Screening: does not qualify; Completed   Vision Screening: Recommended annual ophthalmology exams for early detection of glaucoma and other disorders of the eye. Is the patient up to date with their annual eye exam?  Yes  Who is the provider or what is the name of the office in which  the patient attends annual eye exams? Dr. Zenaida Niece If pt is not established with a provider, would they like to be referred to a provider to establish care? No .   Dental Screening: Recommended annual dental exams for proper oral hygiene  Diabetic Foot Exam: na  Community Resource Referral / Chronic Care Management: CRR required this visit?  No   CCM required this visit?  No     Plan:     I have personally reviewed and noted the following in the patient's chart:   Medical and social history Use of alcohol, tobacco or illicit drugs  Current medications and supplements including opioid prescriptions. Patient is not currently taking opioid prescriptions. Functional ability and status Nutritional status Physical activity Advanced directives List of other physicians Hospitalizations, surgeries, and ER visits in previous 12 months Vitals Screenings to include cognitive, depression, and falls Referrals and appointments  In addition, I have reviewed and discussed with patient certain preventive protocols, quality metrics, and best practice recommendations. A written personalized care plan for preventive services as well as general preventive health recommendations were provided to patient.     Jordan Hawks Oswell Say, CMA   11/11/2023   After Visit Summary: (MyChart) Due to this being a telephonic visit, the after visit summary with patients personalized plan was offered to patient via MyChart   Nurse Notes: none

## 2023-11-11 NOTE — Patient Instructions (Addendum)
Debra Norton , Thank you for taking time to come for your Medicare Wellness Visit. I appreciate your ongoing commitment to your health goals. Please review the following plan we discussed and let me know if I can assist you in the future.   Referrals/Orders/Follow-Ups/Clinician Recommendations:  Next Medicare Annual Wellness Visit: November 16, 2024 at 10:50 am virtual visit  You have an order for:  []   2D Mammogram  [x]   3D Mammogram  []   Bone Density     Please call for appointment:   [facility] [address] [place], [place] [address] [phone]  Make sure to wear two-piece clothing.  No lotions powders or deodorants the day of the appointment Make sure to bring picture ID and insurance card.  Bring list of medications you are currently taking including any supplements.   Schedule your [place] screening mammogram through MyChart!   Log into your MyChart account.  Go to 'Visit' (or 'Appointments' if on mobile App) --> Schedule an Appointment  Under 'Select a Reason for Visit' choose the Mammogram Screening option.  Complete the pre-visit questions and select the time and place that best fits your schedule.  Please call your GI doctor to set up your appointment for your colonoscopy.     This is a list of the screening recommended for you and due dates:  Health Maintenance  Topic Date Due   Colon Cancer Screening  11/07/2023   COVID-19 Vaccine (6 - 2023-24 season) 11/16/2023*   Zoster (Shingles) Vaccine (1 of 2) 01/31/2024*   Flu Shot  03/15/2024*   Mammogram  12/26/2023   Medicare Annual Wellness Visit  11/10/2024   DEXA scan (bone density measurement)  02/20/2026   Pneumonia Vaccine  Completed   Hepatitis C Screening  Completed   HPV Vaccine  Aged Out   DTaP/Tdap/Td vaccine  Discontinued  *Topic was postponed. The date shown is not the original due date.    Advanced directives: (ACP Link)Information on Advanced Care Planning can be  found at [facility] of [facility] Directives Advance Health Care Directives ([email or web address]   Next Medicare Annual Wellness Visit scheduled for next year: Yes  Understanding Your Risk for Falls Millions of people have serious injuries from falls each year. It is important to understand your risk of falling. Talk with your health care provider about your risk and what you can do to lower it. If you do have a serious fall, make sure to tell your provider. Falling once raises your risk of falling again. How can falls affect me? Serious injuries from falls are common. These include: Broken bones, such as hip fractures. Head injuries, such as traumatic brain injuries (TBI) or concussions. A fear of falling can cause you to avoid activities and stay at home. This can make your muscles weaker and raise your risk for a fall. What can increase my risk? There are a number of risk factors that increase your risk for falling. The more risk factors you have, the higher your risk of falling. Serious injuries from a fall happen most often to people who are older than 66 years old. Teenagers and young adults ages 42-29 are also at higher risk. Common risk factors include: Weakness in the lower body. Being generally weak or confused due to long-term (chronic) illness. Dizziness or balance problems. Poor vision. Medicines that cause dizziness or drowsiness. These may include: Medicines for your blood pressure, heart, anxiety, insomnia, or swelling (edema). Pain medicines. Muscle relaxants.  Other risk factors include: Drinking alcohol. Having had a fall in the past. Having foot pain or wearing improper footwear. Working at a dangerous job. Having any of the following in your home: Tripping hazards, such as floor clutter or loose rugs. Poor lighting. Pets. Having dementia or memory loss. What actions can I take to lower my risk of falling?     Physical activity Stay  physically fit. Do strength and balance exercises. Consider taking a regular class to build strength and balance. Yoga and tai chi are good options. Vision Have your eyes checked every year and your prescription for glasses or contacts updated as needed. Shoes and walking aids Wear non-skid shoes. Wear shoes that have rubber soles and low heels. Do not wear high heels. Do not walk around the house in socks or slippers. Use a cane or walker as told by your provider. Home safety Attach secure railings on both sides of your stairs. Install grab bars for your bathtub, shower, and toilet. Use a non-skid mat in your bathtub or shower. Attach bath mats securely with double-sided, non-slip rug tape. Use good lighting in all rooms. Keep a flashlight near your bed. Make sure there is a clear path from your bed to the bathroom. Use night-lights. Do not use throw rugs. Make sure all carpeting is taped or tacked down securely. Remove all clutter from walkways and stairways, including extension cords. Repair uneven or broken steps and floors. Avoid walking on icy or slippery surfaces. Walk on the grass instead of on icy or slick sidewalks. Use ice melter to get rid of ice on walkways in the winter. Use a cordless phone. Questions to ask your health care provider Can you help me check my risk for a fall? Do any of my medicines make me more likely to fall? Should I take a vitamin D supplement? What exercises can I do to improve my strength and balance? Should I make an appointment to have my vision checked? Do I need a bone density test to check for weak bones (osteoporosis)? Would it help to use a cane or a walker? Where to find more information Centers for Disease Control and Prevention, STEAD[employer]: TonerPromos.no Community-Based Fall Prevention Programs: TonerPromos.no [employer] on Aging: BaseRingTones.pl Contact a health care provider if: You fall at home. You are afraid of falling at home. You feel weak,  drowsy, or dizzy. This information is not intended to replace advice given to you by your health care provider. Make sure you discuss any questions you have with your health care provider. Document Revised: 08/05/2022 Document Reviewed: 08/05/2022 Elsevier Patient Education  2024 [employer]. Preventive Care 65 Years and Older, Female Preventive care refers to lifestyle choices and visits with your health care provider that can promote health and wellness. Preventive care visits are also called wellness exams. What can I expect for my preventive care visit? Counseling Your health care provider may ask you questions about your: Medical history, including: Past medical problems. Family medical history. Pregnancy and menstrual history. History of falls. Current health, including: Memory and ability to understand (cognition). Emotional well-being. Home life and relationship well-being. Sexual activity and sexual health. Lifestyle, including: Alcohol, nicotine or tobacco, and drug use. Access to firearms. Diet, exercise, and sleep habits. Work and work Astronomer. Sunscreen use. Safety issues such as seatbelt and bike helmet use. Physical exam Your health care provider will check your: Height and weight. These may be used to calculate your BMI (body mass index). BMI is a  measurement that tells if you are at a healthy weight. Waist circumference. This measures the distance around your waistline. This measurement also tells if you are at a healthy weight and may help predict your risk of certain diseases, such as type 2 diabetes and high blood pressure. Heart rate and blood pressure. Body temperature. Skin for abnormal spots. What immunizations do I need?  Vaccines are usually given at various ages, according to a schedule. Your health care provider will recommend vaccines for you based on your age, medical history, and lifestyle or other factors, such as travel or where you  work. What tests do I need? Screening Your health care provider may recommend screening tests for certain conditions. This may include: Lipid and cholesterol levels. Hepatitis C test. Hepatitis B test. HIV (human immunodeficiency virus) test. STI (sexually transmitted infection) testing, if you are at risk. Lung cancer screening. Colorectal cancer screening. Diabetes screening. This is done by checking your blood sugar (glucose) after you have not eaten for a while (fasting). Mammogram. Talk with your health care provider about how often you should have regular mammograms. BRCA-related cancer screening. This may be done if you have a family history of breast, ovarian, tubal, or peritoneal cancers. Bone density scan. This is done to screen for osteoporosis. Talk with your health care provider about your test results, treatment options, and if necessary, the need for more tests. Follow these instructions at home: Eating and drinking  Eat a diet that includes fresh fruits and vegetables, whole grains, lean protein, and low-fat dairy products. Limit your intake of foods with high amounts of sugar, saturated fats, and salt. Take vitamin and mineral supplements as recommended by your health care provider. Do not drink alcohol if your health care provider tells you not to drink. If you drink alcohol: Limit how much you have to 0-1 drink a day. Know how much alcohol is in your drink. In the [place] one drink equals one 12 oz bottle of beer (355 mL), one 5 oz glass of wine (148 mL), or one 1 oz glass of hard liquor (44 mL). Lifestyle Brush your teeth every morning and night with fluoride toothpaste. Floss one time each day. Exercise for at least 30 minutes 5 or more days each week. Do not use any products that contain nicotine or tobacco. These products include cigarettes, chewing tobacco, and vaping devices, such as e-cigarettes. If you need help quitting, ask your health care provider. Do not  use drugs. If you are sexually active, practice safe sex. Use a condom or other form of protection in order to prevent STIs. Take aspirin only as told by your health care provider. Make sure that you understand how much to take and what form to take. Work with your health care provider to find out whether it is safe and beneficial for you to take aspirin daily. Ask your health care provider if you need to take a cholesterol-lowering medicine (statin). Find healthy ways to manage stress, such as: Meditation, yoga, or listening to music. Journaling. Talking to a trusted person. Spending time with friends and family. Minimize exposure to UV radiation to reduce your risk of skin cancer. Safety Always wear your seat belt while driving or riding in a vehicle. Do not drive: If you have been drinking alcohol. Do not ride with someone who has been drinking. When you are tired or distracted. While texting. If you have been using any mind-altering substances or drugs. Wear a helmet and other protective equipment during  sports activities. If you have firearms in your house, make sure you follow all gun safety procedures. What's next? Visit your health care provider once a year for an annual wellness visit. Ask your health care provider how often you should have your eyes and teeth checked. Stay up to date on all vaccines. This information is not intended to replace advice given to you by your health care provider. Make sure you discuss any questions you have with your health care provider. Document Revised: 05/30/2021 Document Reviewed: 05/30/2021 Elsevier Patient Education  2024 [employer].

## 2023-11-18 ENCOUNTER — Encounter: Payer: Self-pay | Admitting: Gastroenterology

## 2023-12-05 ENCOUNTER — Encounter: Payer: Self-pay | Admitting: *Deleted

## 2023-12-05 ENCOUNTER — Inpatient Hospital Stay: Payer: Medicare HMO | Attending: Hematology and Oncology | Admitting: Adult Health

## 2023-12-05 ENCOUNTER — Encounter: Payer: Self-pay | Admitting: Adult Health

## 2023-12-05 VITALS — BP 116/64 | HR 78 | Temp 97.6°F | Resp 18 | Ht 62.0 in | Wt 152.5 lb

## 2023-12-05 DIAGNOSIS — F325 Major depressive disorder, single episode, in full remission: Secondary | ICD-10-CM | POA: Diagnosis not present

## 2023-12-05 DIAGNOSIS — D0511 Intraductal carcinoma in situ of right breast: Secondary | ICD-10-CM

## 2023-12-05 DIAGNOSIS — Z87891 Personal history of nicotine dependence: Secondary | ICD-10-CM | POA: Diagnosis not present

## 2023-12-05 DIAGNOSIS — Z923 Personal history of irradiation: Secondary | ICD-10-CM | POA: Diagnosis not present

## 2023-12-05 DIAGNOSIS — Z8616 Personal history of COVID-19: Secondary | ICD-10-CM | POA: Diagnosis not present

## 2023-12-05 DIAGNOSIS — Z79810 Long term (current) use of selective estrogen receptor modulators (SERMs): Secondary | ICD-10-CM | POA: Insufficient documentation

## 2023-12-05 DIAGNOSIS — I739 Peripheral vascular disease, unspecified: Secondary | ICD-10-CM | POA: Diagnosis not present

## 2023-12-05 DIAGNOSIS — C50919 Malignant neoplasm of unspecified site of unspecified female breast: Secondary | ICD-10-CM | POA: Diagnosis not present

## 2023-12-05 DIAGNOSIS — N6489 Other specified disorders of breast: Secondary | ICD-10-CM

## 2023-12-05 DIAGNOSIS — Z833 Family history of diabetes mellitus: Secondary | ICD-10-CM | POA: Diagnosis not present

## 2023-12-05 DIAGNOSIS — Z8249 Family history of ischemic heart disease and other diseases of the circulatory system: Secondary | ICD-10-CM | POA: Diagnosis not present

## 2023-12-05 DIAGNOSIS — Z809 Family history of malignant neoplasm, unspecified: Secondary | ICD-10-CM | POA: Diagnosis not present

## 2023-12-05 DIAGNOSIS — Z79899 Other long term (current) drug therapy: Secondary | ICD-10-CM | POA: Diagnosis not present

## 2023-12-05 DIAGNOSIS — F419 Anxiety disorder, unspecified: Secondary | ICD-10-CM | POA: Diagnosis not present

## 2023-12-05 DIAGNOSIS — F411 Generalized anxiety disorder: Secondary | ICD-10-CM | POA: Diagnosis not present

## 2023-12-05 MED ORDER — ALPRAZOLAM 0.25 MG PO TABS: 0.2500 mg | ORAL_TABLET | Freq: Every evening | ORAL | 0 refills | Status: AC | PRN

## 2023-12-05 NOTE — Progress Notes (Signed)
Called the [employer] on behalf of pt and submitted an application for the [place] program. Pt is interested in joining. I also Futures trader of program.

## 2023-12-05 NOTE — Progress Notes (Signed)
SURVIVORSHIP VISIT:  BRIEF ONCOLOGIC HISTORY:  Oncology History  Ductal carcinoma in situ (DCIS) of right breast  01/13/2023 Initial Diagnosis   Screening mammogram detected right breast mass 1.6 x 0.5 cm at 3 o'clock position biopsy revealed high-grade DCIS solid and cribriform features ER 40%, PR 0%   01/15/2023 Cancer Staging   Staging form: Breast, AJCC 8th Edition - Clinical stage from 01/15/2023: Stage 0 (cTis (DCIS), cN0, cM0, ER+, PR-, HER2: Not Assessed) - Signed by Serena Croissant, MD on 01/15/2023 Stage prefix: Initial diagnosis Nuclear grade: G3 Laterality: Right Staged by: Pathologist and managing physician Stage used in treatment planning: Yes National guidelines used in treatment planning: Yes Type of national guideline used in treatment planning: NCCN    Genetic Testing   [facility] Common Cancer Panel+RNA was Negative. Report date is 01/25/2023.  The Common Hereditary Cancers Panel offered by [facility] includes sequencing and/or deletion duplication testing of the following 48 genes: APC, ATM, AXIN2, BAP1, BARD1, BMPR1A, BRCA1, BRCA2, BRIP1, CDH1, CDK4, CDKN2A (p14ARF and p16INK4a only), CHEK2, CTNNA1, DICER1, EPCAM (Deletion/duplication testing only), FH, GREM1 (promoter region duplication testing only), HOXB13, KIT, MBD4, MEN1, MLH1, MSH2, MSH3, MSH6, MUTYH, NF1, NHTL1, PALB2, PDGFRA, PMS2, POLD1, POLE, PTEN, RAD51C, RAD51D, SDHA (sequencing analysis only except exon 14), SDHB, SDHC, SDHD, SMAD4, SMARCA4. STK11, TP53, TSC1, TSC2, and VHL.   01/30/2023 Surgery   Right lumpectomy: High-grade DCIS with necrosis 1 cm, no evidence of invasive cancer, margins negative, ER 40%, PR 0%   03/13/2023 - 04/09/2023 Radiation Therapy   Plan Name: Breast_R Site: Breast, Right Technique: 3D Mode: Photon Dose Per Fraction: 2.66 Gy Prescribed Dose (Delivered / Prescribed): 42.56 Gy / 42.56 Gy Prescribed Fxs (Delivered / Prescribed): 16 / 16   Plan Name: Breast_R_Bst Site: Breast,  Right Technique: 3D Mode: Photon Dose Per Fraction: 2 Gy Prescribed Dose (Delivered / Prescribed): 8 Gy / 8 Gy Prescribed Fxs (Delivered / Prescribed): 4 / 4   03/2023 -  Anti-estrogen oral therapy   Tamoxifen     INTERVAL HISTORY:    Discussed the use of AI scribe software for clinical note transcription with the patient, who gave verbal consent to proceed.  Debra Norton was previously diagnosed with stage zero breast cancer or ductal carcinoma in situ in the right breast, estrogen positive, presents for a follow-up consultation. She underwent a lumpectomy and radiation therapy, with the main side effects being tiredness, skin discoloration, and breast swelling. The patient is currently on tamoxifen, experiencing hot flashes, mood changes, and vaginal wetness. She reports an increasing awareness of the breast she had surgery on, describing it as a pressure sensation rather than pain, particularly noticeable when lifting her arm.  The patient also expresses concerns about her weight, despite maintaining a healthy diet and regular exercise. She has been trying to lose an extra ten to twenty pounds for several years. The patient is a former smoker, having quit in 1978. She is scheduled for a colonoscopy and a diagnostic 3D mammogram.  The patient is also taking Xanax, which she uses infrequently, primarily for sleep issues. She has been scheduled for a follow-up visit in six months.  REVIEW OF SYSTEMS:  Review of Systems  Constitutional:  Negative for appetite change, chills, fatigue, fever and unexpected weight change.  HENT:   Negative for hearing loss, lump/mass and trouble swallowing.   Eyes:  Negative for eye problems and icterus.  Respiratory:  Negative for chest tightness, cough and shortness of breath.   Cardiovascular:  Negative for chest pain,  leg swelling and palpitations.  Gastrointestinal:  Negative for abdominal distention, abdominal pain, constipation, diarrhea, nausea and  vomiting.  Endocrine: Negative for hot flashes.  Genitourinary:  Negative for difficulty urinating.   Musculoskeletal:  Negative for arthralgias.  Skin:  Negative for itching and rash.  Neurological:  Negative for dizziness, extremity weakness, headaches and numbness.  Hematological:  Negative for adenopathy. Does not bruise/bleed easily.  Psychiatric/Behavioral:  Negative for depression. The patient is not nervous/anxious.   Breast: Denies any new nodularity, masses, tenderness, nipple changes, or nipple discharge.       PAST MEDICAL/SURGICAL HISTORY:  Past Medical History:  Diagnosis Date   Allergy    Anxiety    Arthritis    Cancer (HCC)    Cervical dysplasia    Cholelithiasis    14mm stone seen on ultrasound in 2009   Colon polyp    Complication of anesthesia    pain was not controlled in PACU- had to be admitted   Depression    Diverticulitis    Migraine headache    years ago   Sleep apnea    Vertigo    Past Surgical History:  Procedure Laterality Date   BREAST LUMPECTOMY WITH RADIOACTIVE SEED LOCALIZATION Right 01/30/2023   Procedure: RIGHT BREAST LUMPECTOMY WITH RADIOACTIVE SEED LOCALIZATION;  Surgeon: Almond Lint, MD;  Location: [facility] OR;  Service: General;  Laterality: Right;   BREAST SURGERY     CHOLECYSTECTOMY     COLPOSCOPY     ECTOPIC PREGNANCY SURGERY     EXTERNAL EAR SURGERY Left    EYE SURGERY     GYNECOLOGIC CRYOSURGERY       ALLERGIES:  Allergies  Allergen Reactions   Diphenhydramine Hcl     VERY WIRED/palpitations  Benadryl   Flagyl [Metronidazole] Nausea And Vomiting   Penicillins Hives   Tetanus Toxoid     REALLY BAD SWELLING at the injection site   Clindamycin/Lincomycin Rash    Rash shortly after she finished a round of abx , see OV note 05-10-15   Hydrocodone     Insomnia, itching (mild)     CURRENT MEDICATIONS:  Outpatient Encounter Medications as of 12/05/2023  Medication Sig   ALPRAZolam (XANAX) 0.25 MG tablet Take 1 tablet  (0.25 mg total) by mouth at bedtime as needed for anxiety.   tamoxifen (NOLVADEX) 10 MG tablet Take 0.5 tablets (5 mg total) by mouth daily.   [DISCONTINUED] ALPRAZolam (XANAX) 0.25 MG tablet Take 1 tablet (0.25 mg total) by mouth at bedtime as needed for anxiety.   [DISCONTINUED] clotrimazole-betamethasone (LOTRISONE) cream Apply 1 Application topically daily.   No facility-administered encounter medications on file as of 12/05/2023.     ONCOLOGIC FAMILY HISTORY:  Family History  Problem Relation Age of Onset   Colon cancer Mother 58 - 33   Anxiety disorder Mother    Asthma Mother    Cancer Mother    CAD Father        11   Diabetes Sister    Early death Sister    Ovarian cancer Cousin        maternal first cousin   Breast cancer Neg Hx    Esophageal cancer Neg Hx    Pancreatic cancer Neg Hx      SOCIAL HISTORY:  Social History   Socioeconomic History   Marital status: Widowed    Spouse name: Not on file   Number of children: 0   Years of education: Not on file   Highest education level:  Not on file  Occupational History   Occupation: [employer], 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  Tobacco Use   Smoking status: Former    Current packs/day: 0.00    Types: Cigarettes    Start date: 5    Quit date: 05/16/1977    Years since quitting: 46.5   Smokeless tobacco: Never   Tobacco comments:    quit in high School  Vaping Use   Vaping status: Never Used  Substance and Sexual Activity   Alcohol use: Yes    Alcohol/week: 2.0 standard drinks of alcohol    Types: 2 Standard drinks or equivalent per week   Drug use: Not Currently   Sexual activity: Yes    Birth control/protection: None    Comment: INTERCOURSE AGE 13, SEXUAL PARTNERS  MORE THAN 5  Other Topics Concern   Not on file  Social History Narrative   Lives with boy friend   G4 P0    miscarriage x 3    ectopic x 1    Social Drivers of Corporate investment banker Strain: Low Risk  (11/04/2023)   Overall Financial Resource Strain (CARDIA)     Difficulty of Paying Living Expenses: Not hard at all  Food Insecurity: No Food Insecurity (11/04/2023)   Hunger Vital Sign    Worried About Running Out of Food in the Last Year: Never true    Ran Out of Food in the Last Year: Never true  Transportation Needs: No Transportation Needs (11/04/2023)   PRAPARE - Administrator, Civil Service (Medical): No    Lack of Transportation (Non-Medical): No  Physical Activity: Sufficiently Active (11/04/2023)   Exercise Vital Sign    Days of Exercise per Week: 5 days    Minutes of Exercise per Session: 50 min  Stress: No Stress Concern Present (11/04/2023)   [employer] of Occupational Health - Occupational Stress Questionnaire    Feeling of Stress : Only a little  Social Connections: Moderately Integrated (11/04/2023)   Social Connection and Isolation Panel [NHANES]    Frequency of Communication with Friends and Family: More than three times a week    Frequency of Social Gatherings with Friends and Family: Three times a week    Attends Religious Services: 1 to 4 times per year    Active Member of Clubs or Organizations: No    Attends Banker Meetings: 1 to 4 times per year    Marital Status: Widowed  Recent Concern: Social Connections - Moderately Isolated (11/04/2023)   Social Connection and Isolation Panel [NHANES]    Frequency of Communication with Friends and Family: More than three times a week    Frequency of Social Gatherings with Friends and Family: Three times a week    Attends Religious Services: Never    Active Member of Clubs or Organizations: No    Attends Banker Meetings: 1 to 4 times per year    Marital Status: Widowed  Intimate Partner Violence: Not At Risk (11/11/2023)   Humiliation, Afraid, Rape, and Kick questionnaire    Fear of Current or Ex-Partner: No    Emotionally Abused: No    Physically Abused: No    Sexually Abused: No     OBSERVATIONS/OBJECTIVE:  BP 116/64 (BP  Location: Left Arm, Patient Position: Sitting)   Pulse 78   Temp 97.6 F (36.4 C) (Temporal)   Resp 18   Ht 5\' 2"  (1.575 m)   Wt 152 lb 8 oz (69.2 kg)   SpO2 99%  BMI 27.89 kg/m  GENERAL: Patient is a well appearing female in no acute distress HEENT:  Sclerae anicteric.  Oropharynx clear and moist. No ulcerations or evidence of oropharyngeal candidiasis. Neck is supple.  NODES:  No cervical, supraclavicular, or axillary lymphadenopathy palpated.  BREAST EXAM:  left breast benign, right breast s/p lumpectomy and radiation, no sign of recurrence noted, fullness in right lateral breast LUNGS:  Clear to auscultation bilaterally.  No wheezes or rhonchi. HEART:  Regular rate and rhythm. No murmur appreciated. ABDOMEN:  Soft, nontender.  Positive, normoactive bowel sounds. No organomegaly palpated. MSK:  No focal spinal tenderness to palpation. Full range of motion bilaterally in the upper extremities. EXTREMITIES:  No peripheral edema.   SKIN:  Clear with no obvious rashes or skin changes. No nail dyscrasia. NEURO:  Nonfocal. Well oriented.  Appropriate affect.   LABORATORY DATA:  None for this visit.  DIAGNOSTIC IMAGING:  None for this visit.      ASSESSMENT AND PLAN:  Debra Norton is a pleasant 67 y.o. female with Stage 0 right breast DCIS, ER+/PR+, diagnosed in 12/2022, treated with lumpectomy, adjuvant radiation therapy, and anti-estrogen therapy with Tamoxifen beginning in 03/2023.  She presents to the Survivorship Clinic for our initial meeting and routine follow-up post-completion of treatment for breast cancer.    1. Stage 0 right breast cancer:  Debra Norton is continuing to recover from definitive treatment for breast cancer. She will follow-up with her medical oncologist, Dr.  Magnus Ivan in 10/2024 and Dr. Dwain Sarna in 04/2024 with history and physical exam per surveillance protocol.  She will continue her anti-estrogen therapy with Tamoxifen. Thus far, she is tolerating  the Tamoxifen well, with minimal side effects. Her mammogram is due 12/2023, I also placed orders for ultrasound to evaluate the right breast fullness.  Today, a comprehensive survivorship care plan and treatment summary was reviewed with the patient today detailing her breast cancer diagnosis, treatment course, potential late/long-term effects of treatment, appropriate follow-up care with recommendations for the future, and patient education resources.  A copy of this summary, along with a letter will be sent to the patient's primary care provider via mail/fax/In Basket message after today's visit.    2. Weight concerns: We discussed diet and exercise.  The survivorship navigator is linking her with Kendra Opitz and the [place] [facility] at the [facility].  I gave her handouts on the 6 pillars of health: nutrition, exercise, sleep, social connectedness, stress management, and avoidance of risky substances from the [employer].    3. Bone health: She was given education on specific activities to promote bone health.  4. Cancer screening:  Due to Debra Norton's history and her age, she should receive screening for skin cancers, colon cancer, and gynecologic cancers.  The information and recommendations are listed on the patient's comprehensive care plan/treatment summary and were reviewed in detail with the patient.    5. Health maintenance and wellness promotion: Debra Norton was encouraged to consume 5-7 servings of fruits and vegetables per day. We reviewed the "Nutrition Rainbow" handout.  She was also encouraged to engage in moderate to vigorous exercise for 30 minutes per day most days of the week.  She was instructed to limit her alcohol consumption and continue to abstain from tobacco use.     6. Support services/counseling: It is not uncommon for this period of the patient's cancer care trajectory to be one of many emotions and stressors.   She was given information  regarding our  available services and encouraged to contact me with any questions or for help enrolling in any of our support group/programs.    Follow up instructions:    -Return to cancer center in 10/2024 for f/u with Dr. Pamelia Hoit as scheduled  -Mammogram due in 12/2023 -F/u with Dr Dwain Sarna 04/2024 -She is welcome to return back to the Survivorship Clinic at any time; no additional follow-up needed at this time.  -Consider referral back to survivorship as a long-term survivor for continued surveillance  The patient was provided an opportunity to ask questions and all were answered. The patient agreed with the plan and demonstrated an understanding of the instructions.   Total encounter time:40 minutes*in face-to-face visit time, chart review, lab review, care coordination, order entry, and documentation of the encounter time.    Lillard Anes, NP 12/05/23 1:42 PM Medical Oncology and Hematology [facility] [address] [place], [place] [address] Tel. [phone]    Fax. [phone]  *Total Encounter Time as defined by the Centers for Medicare and Medicaid Services includes, in addition to the face-to-face time of a patient visit (documented in the note above) non-face-to-face time: obtaining and reviewing outside history, ordering and reviewing medications, tests or procedures, care coordination (communications with other health care professionals or caregivers) and documentation in the medical record.

## 2023-12-26 DIAGNOSIS — N644 Mastodynia: Secondary | ICD-10-CM | POA: Diagnosis not present

## 2023-12-26 DIAGNOSIS — Z853 Personal history of malignant neoplasm of breast: Secondary | ICD-10-CM | POA: Diagnosis not present

## 2023-12-29 ENCOUNTER — Encounter: Payer: Self-pay | Admitting: Internal Medicine

## 2024-01-07 ENCOUNTER — Ambulatory Visit (INDEPENDENT_AMBULATORY_CARE_PROVIDER_SITE_OTHER): Payer: Medicare HMO | Admitting: Neurology

## 2024-01-07 ENCOUNTER — Encounter: Payer: Self-pay | Admitting: Neurology

## 2024-01-07 VITALS — BP 121/69 | HR 74 | Ht 62.0 in | Wt 156.8 lb

## 2024-01-07 DIAGNOSIS — R251 Tremor, unspecified: Secondary | ICD-10-CM | POA: Insufficient documentation

## 2024-01-07 DIAGNOSIS — Z9189 Other specified personal risk factors, not elsewhere classified: Secondary | ICD-10-CM | POA: Diagnosis not present

## 2024-01-07 DIAGNOSIS — R232 Flushing: Secondary | ICD-10-CM

## 2024-01-07 DIAGNOSIS — R0683 Snoring: Secondary | ICD-10-CM | POA: Diagnosis not present

## 2024-01-07 NOTE — Patient Instructions (Signed)
Sleep Apnea Test: What to Expect  Sleep apnea is a condition that affects your breathing while you're sleeping. You may have shallow breathing or stop breathing for short periods of time. Sleep apnea screening is a test to check if you're at risk for sleep apnea. The test includes questions. It will only takes a few minutes. Your health care provider may ask you to have this test before a surgery or as part of a physical exam. What are the symptoms of sleep apnea? Snoring. Waking up often at night. Daytime sleepiness. Pauses in breathing. Choking or gasping during sleep. Being annoyed easily. Forgetfulness. Trouble thinking clearly. Depression. Personality changes. Headaches in the morning. Most people with sleep apnea do not know that they have it. What are the advantages of sleep apnea screening? Getting screened for sleep apnea can help: Keep you safer. Your providers need to know whether or not you have sleep apnea, especially if you're having surgery or have other long-term, or chronic, health conditions. Improve your health and help you get a better night's rest. Restful sleep can help you: Have more energy. Lose weight. Improve high blood pressure. Improve diabetes management. Prevent stroke. Prevent car accidents. What happens before the screening? You may talk with your provider about the screening and what other tests may be recommended based on the screening. What happens during the screening? Screening usually includes being asked a list of questions about your sleep quality. Some questions you may be asked include: Do you snore? Is your sleep restless? Do you have daytime sleepiness? Has a partner or spouse told you that you stop breathing, choke, or gasp during sleep? Have you had trouble concentrating or memory loss? What is your age? What is your neck circumference? To measure your neck, keep your back straight and gently wrap the tape measure around your neck.  Put the tape measure at the middle of your neck, between your chin and collarbone. What is your sex assigned at birth? Do you have high blood pressure or are you being treated for high blood pressure? If your screening test is positive, you're at risk for the condition. More tests may be needed to confirm a diagnosis of sleep apnea. What can I expect after the screening? Your provider will go over the results of the screening with you and make recommendations based on the results of the test. Where to find more information You can find screening tools online or at your health care clinic. To learn more, go to these websites: Centers for Disease Control and Prevention: DiningCalendar.de. Then: Click Health Topics A-Z. Type "sleep apnea" in the search box. National Heart, Lung, and Blood Institute: BuffaloDryCleaner.gl Contact a health care provider if: You think that you may have sleep apnea. This information is not intended to replace advice given to you by your health care provider. Make sure you discuss any questions you have with your health care provider. Document Revised: 05/10/2023 Document Reviewed: 05/10/2023 Elsevier Patient Education  2024 Elsevier Inc.Quality Sleep Information, Adult Quality sleep is important for your mental and physical health. It also improves your quality of life. Quality sleep means you: Are asleep for most of the time you are in bed. Fall asleep within 30 minutes. Wake up no more than once a night. Are awake for no longer than 20 minutes if you do wake up during the night. Most adults need 7-8 hours of quality sleep each night. How can poor sleep affect me? If you do not get enough quality  sleep, you may have: Mood swings. Daytime sleepiness. Decreased alertness, reaction time, and concentration. Sleep disorders, such as insomnia and sleep apnea. Difficulty with: Solving problems. Coping with stress. Paying attention. These issues may affect your performance and  productivity at work, school, and home. Lack of sleep may also put you at higher risk for accidents, suicide, and risky behaviors. If you do not get quality sleep, you may also be at higher risk for several health problems, including: Infections. Type 2 diabetes. Heart disease. High blood pressure. Obesity. Worsening of long-term conditions, like arthritis, kidney disease, depression, Parkinson's disease, and epilepsy. What actions can I take to get more quality sleep? Sleep schedule and routine Stick to a sleep schedule. Go to sleep and wake up at about the same time each day. Do not try to sleep less on weekdays and make up for lost sleep on weekends. This does not work. Limit naps during the day to 30 minutes or less. Do not take naps in the late afternoon. Make time to relax before bed. Reading, listening to music, or taking a hot bath promotes quality sleep. Make your bedroom a place that promotes quality sleep. Keep your bedroom dark, quiet, and at a comfortable room temperature. Make sure your bed is comfortable. Avoid using electronic devices that give off bright blue light for 30 minutes before bedtime. Your brain perceives bright blue light as sunlight. This includes television, phones, and computers. If you are lying awake in bed for longer than 20 minutes, get up and do a relaxing activity until you feel sleepy. Lifestyle     Try to get at least 30 minutes of exercise on most days. Do not exercise 2-3 hours before going to bed. Do not use any products that contain nicotine or tobacco. These products include cigarettes, chewing tobacco, and vaping devices, such as e-cigarettes. If you need help quitting, ask your health care provider. Do not drink caffeinated beverages for at least 8 hours before going to bed. Coffee, tea, and some sodas contain caffeine. Do not drink alcohol or eat large meals close to bedtime. Try to get at least 30 minutes of sunlight every day. Morning sunlight  is best. Medical concerns Work with your health care provider to treat medical conditions that may affect sleeping, such as: Nasal obstruction. Snoring. Sleep apnea and other sleep disorders. Talk to your health care provider if you think any of your prescription medicines may cause you to have difficulty falling or staying asleep. If you have sleep problems, talk with a sleep consultant. If you think you have a sleep disorder, talk with your health care provider about getting evaluated by a specialist. Where to find more information Sleep Foundation: sleepfoundation.org American Academy of Sleep Medicine: aasm.org Centers for Disease Control and Prevention (CDC): TonerPromos.no Contact a health care provider if: You have trouble getting to sleep or staying asleep. You often wake up very early in the morning and cannot get back to sleep. You have daytime sleepiness. You have daytime sleep attacks of suddenly falling asleep and sudden muscle weakness (narcolepsy). You have a tingling sensation in your legs with a strong urge to move your legs (restless legs syndrome). You stop breathing briefly during sleep (sleep apnea). You think you have a sleep disorder or are taking a medicine that is affecting your quality of sleep. Summary Most adults need 7-8 hours of quality sleep each night. Getting enough quality sleep is important for your mental and physical health. Make your bedroom a place  that promotes quality sleep, and avoid things that may cause you to have poor sleep, such as alcohol, caffeine, smoking, or large meals. Talk to your health care provider if you have trouble falling asleep or staying asleep. This information is not intended to replace advice given to you by your health care provider. Make sure you discuss any questions you have with your health care provider. Document Revised: 03/27/2022 Document Reviewed: 03/27/2022 Elsevier Patient Education  2024 Elsevier Inc.Healthy Living:  Sleep In this video, you will learn why sleep is an important part of a healthy lifestyle. To view the content, go to this web address: https://pe.elsevier.com/[email or web address]  This video will expire on: 11/26/2025. If you need access to this video following this date, please reach out to the healthcare provider who assigned it to you. This information is not intended to replace advice given to you by your health care provider. Make sure you discuss any questions you have with your health care provider. Elsevier Patient Education  2024 [employer].

## 2024-01-07 NOTE — Progress Notes (Signed)
SLEEP MEDICINE CLINIC    Provider:  Melvyn Novas, MD  Primary Care Physician:  Clayborne Dana, NP [address] [place] [address]     Referring Provider: Clayborne Dana, Np [address] [place],  [place] [address]          Chief Complaint according to patient   Patient presents with:     New Patient (Initial Visit)           HISTORY OF PRESENT ILLNESS:  Debra Norton is a 68 y.o. female patient who is seen upon referral on 01/07/2024 from NP Reola Calkins, office of Dr Leta Jungling.  for a new evaluation of fatigue, surgery and radiation last year 2024 for breast cancer.  She is still working and she has been declared cancer free.  She is on tamoxifen. Had some hot flushes but not affecting .   Chief concern according to patient :  " I have developed tremors from my waist up, a mild shaking when I go to sleep or wake up from sleep" . These tremors are felt, not seen.  She also has some pain in the right leg, only when at rest.  No irresistible urge to move,  the sensation passes.  Sometimes still waking up from her own snoring. Headaches when waking up.   No changes in voice, swallowing.  I have the pleasure of seeing Debra Norton again on  01/07/24 a 68 year old -handed female with a possible sleep disorder.  Last visit 6 years ago : Debra Norton is a 68 y.o. female , seen here as in a referral from Dr. Drue Novel for a sleep evaluation,   The patient reports that she underwent at least 2 sleep studies, one at the [facility] hospital annex, another one at Dr. Parke Poisson private sleep lab on [place]. [facility]. She continues to feel that her sleep has a low quality is not sound and struggles with insomnia.  She also reports memory loss respiratory allergies, snoring, daytime fatigue recent surgeries she had a gallbladder surgery just earlier this year. It was right after the procedure under anesthesia that she was again witnessed to  trouble with apnea, very low oxygen saturations, and was asked to follow-up here again. the anesthesiologist made it urgent      Family medical /sleep history: No other family member  with OSA, insomnia, sleep walkers.    Social history:  Patient is working in Audiological scientist, office work, at [employer]. and lives in a household with  a cat, alone. The patient currently 24 hours / week  Tobacco use: none .  ETOH use : 3 beers 3 times a week.  Caffeine intake in form of Coffee( 1 cup in AM ) Soda( /) Tea ( 2) or energy drinks Exercise in form of regular walking.    Sleep habits are as follows: The patient's dinner time is between 5-7 PM. The patient goes to bed at 10-11 PM and continues to sleep for  ca. 7 hours, wakes for 0-1 bathroom break . The preferred sleep position is lateral, with the support of 1 pillow. Dreams are reportedly frequent/vivid.  The patient wakes up with an alarm. 7.30   AM is the usual rise time. 8 AM on weekends  She reports not feeling refreshed or restored in AM, with symptoms such as dry mouth, morning headaches, and residual fatigue.  Naps are taken infrequently.  Sleep medical history  and family sleep history: She is not aware of any family members having obstructive sleep apnea or having been treated for this condition.  She was not a sleep walker.  Not undergo tonsillectomy, she has no history of traumatic brain injuries,ENT surgeries.   11-30-2017  The patient reports that she underwent at least 2 sleep studies, one at t [facility] annex, another one at Dr. Parke Poisson private sleep lab on [place]. [facility].  She continues to feel that her sleep has a low quality, is not sound and still struggles with insomnia.  She also reports memory loss, respiratory allergies, snoring, daytime fatigue. Patient had gallbladder surgery just earlier this year. It was right after the procedure under anesthesia that she was again witnessed to trouble with apnea, very low oxygen  saturations, and was asked to follow-up here again. The anesthesiologist made it urgent.   The patient endorsed the Epworth Sleepiness Scale at 3/24 points.  The patient's weight 143 pounds with a height of 62 (inches), resulting in a BMI of 26.4 kg/m2.The patient's neck circumference measured 13.5 inches.     Review of Systems: Out of a complete 14 system review, the patient complains of only the following symptoms, and all other reviewed systems are negative.:  Fatigue, sleepiness , snoring, fragmented sleep,  How likely are you to doze in the following situations: 0 = not likely, 1 = slight chance, 2 = moderate chance, 3 = high chance   Sitting and Reading? Watching Television? Sitting inactive in a public place (theater or meeting)? As a passenger in a car for an hour without a break? Lying down in the afternoon when circumstances permit? Sitting and talking to someone? Sitting quietly after lunch without alcohol? In a car, while stopped for a few minutes in traffic?   Total = 5/ 24 points   FSS endorsed at 30/ 63 points.   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  Tobacco Use   Smoking status: Former    Current packs/day: 0.00    Types: Cigarettes    Start date: 42    Quit date: 05/16/1977    Years since quitting: 46.6   Smokeless tobacco: Never   Tobacco comments:    quit in high School  Vaping Use   Vaping status: Never Used  Substance and Sexual Activity   Alcohol use: Yes    Alcohol/week: 2.0 standard drinks of alcohol    Types: 2 Standard drinks or equivalent per week   Drug use: Not Currently   Sexual activity: Yes    Birth control/protection: None    Comment: INTERCOURSE AGE 70, SEXUAL PARTNERS  MORE THAN 5  Other Topics Concern   Not on file   Social History Narrative   Lives with boy friend   G4 P0    miscarriage x 3    ectopic x 1    Social Drivers of Corporate investment banker Strain: Low Risk  (11/04/2023)   Overall Financial Resource Strain (CARDIA)    Difficulty of Paying Living Expenses: Not hard at all  Food Insecurity: No Food Insecurity (11/04/2023)   Hunger Vital Sign    Worried About Running Out of Food in the Last Year: Never true  Ran Out of Food in the Last Year: Never true  Transportation Needs: No Transportation Needs (11/04/2023)   PRAPARE - Administrator, Civil Service (Medical): No    Lack of Transportation (Non-Medical): No  Physical Activity: Sufficiently Active (11/04/2023)   Exercise Vital Sign    Days of Exercise per Week: 5 days    Minutes of Exercise per Session: 50 min  Stress: No Stress Concern Present (11/04/2023)   [employer] of Occupational Health - Occupational Stress Questionnaire    Feeling of Stress : Only a little  Social Connections: Moderately Integrated (11/04/2023)   Social Connection and Isolation Panel [NHANES]    Frequency of Communication with Friends and Family: More than three times a week    Frequency of Social Gatherings with Friends and Family: Three times a week    Attends Religious Services: 1 to 4 times per year    Active Member of Clubs or Organizations: No    Attends Banker Meetings: 1 to 4 times per year    Marital Status: Widowed  Recent Concern: Social Connections - Moderately Isolated (11/04/2023)   Social Connection and Isolation Panel [NHANES]    Frequency of Communication with Friends and Family: More than three times a week    Frequency of Social Gatherings with Friends and Family: Three times a week    Attends Religious Services: Never    Active Member of Clubs or Organizations: No    Attends Banker Meetings: 1 to 4 times per year    Marital Status: Widowed    Family History  Problem Relation Age  of Onset   Colon cancer Mother 26 - 33   Anxiety disorder Mother    Asthma Mother    Cancer Mother    CAD Father        81   Diabetes Sister    Early death Sister    Ovarian cancer Cousin        maternal first cousin   Breast cancer Neg Hx    Esophageal cancer Neg Hx    Pancreatic cancer Neg Hx     Past Medical History:  Diagnosis Date   Allergy    Anxiety    Arthritis    Cancer (HCC)    Cervical dysplasia    Cholelithiasis    14mm stone seen on ultrasound in 2009   Colon polyp    Complication of anesthesia    pain was not controlled in PACU- had to be admitted   Depression    Diverticulitis    Migraine headache    years ago   Sleep apnea    Vertigo     Past Surgical History:  Procedure Laterality Date   BREAST LUMPECTOMY WITH RADIOACTIVE SEED LOCALIZATION Right 01/30/2023   Procedure: RIGHT BREAST LUMPECTOMY WITH RADIOACTIVE SEED LOCALIZATION;  Surgeon: Almond Lint, MD;  Location: [facility] OR;  Service: General;  Laterality: Right;   BREAST SURGERY     CHOLECYSTECTOMY     COLPOSCOPY     ECTOPIC PREGNANCY SURGERY     EXTERNAL EAR SURGERY Left    EYE SURGERY     GYNECOLOGIC CRYOSURGERY       Current Outpatient Medications on File Prior to Visit  Medication Sig Dispense Refill   ALPRAZolam (XANAX) 0.25 MG tablet Take 1 tablet (0.25 mg total) by mouth at bedtime as needed for anxiety. 30 tablet 0   tamoxifen (NOLVADEX) 10 MG tablet Take 0.5 tablets (5 mg total) by mouth  daily. 90 tablet 1   No current facility-administered medications on file prior to visit.    Allergies  Allergen Reactions   Diphenhydramine Hcl     VERY WIRED/palpitations  Benadryl   Flagyl [Metronidazole] Nausea And Vomiting   Penicillins Hives   Tetanus Toxoid     REALLY BAD SWELLING at the injection site   Clindamycin/Lincomycin Rash    Rash shortly after she finished a round of abx , see OV note 05-10-15   Hydrocodone     Insomnia, itching (mild)     DIAGNOSTIC DATA (LABS, IMAGING,  TESTING) - I reviewed patient records, labs, notes, testing and imaging myself where available.  Lab Results  Component Value Date   WBC 5.5 10/31/2023   HGB 13.1 10/31/2023   HCT 39.8 10/31/2023   MCV 92.1 10/31/2023   PLT 268.0 10/31/2023      Component Value Date/Time   NA 141 10/31/2023 1239   K 4.2 10/31/2023 1239   CL 107 10/31/2023 1239   CO2 27 10/31/2023 1239   GLUCOSE 95 10/31/2023 1239   BUN 13 10/31/2023 1239   CREATININE 0.86 10/31/2023 1239   CREATININE 0.88 01/15/2023 1201   CREATININE 0.82 02/27/2022 0955   CALCIUM 9.1 10/31/2023 1239   PROT 6.4 10/31/2023 1239   ALBUMIN 4.0 10/31/2023 1239   AST 21 10/31/2023 1239   AST 18 01/15/2023 1201   ALT 14 10/31/2023 1239   ALT 12 01/15/2023 1201   ALKPHOS 52 10/31/2023 1239   BILITOT 0.4 10/31/2023 1239   BILITOT 0.5 01/15/2023 1201   GFRNONAA >60 01/24/2023 1550   GFRNONAA >60 01/15/2023 1201   GFRAA >60 09/20/2018 1017   Lab Results  Component Value Date   CHOL 230 (H) 12/11/2022   HDL 61.70 12/11/2022   LDLCALC 142 (H) 12/11/2022   LDLDIRECT 146.5 06/14/2010   TRIG 131.0 12/11/2022   CHOLHDL 4 12/11/2022   Lab Results  Component Value Date   HGBA1C 5.6 10/31/2023   Lab Results  Component Value Date   VITAMINB12 601 10/31/2023   Lab Results  Component Value Date   TSH 3.84 10/31/2023    PHYSICAL EXAM:  Today's Vitals   01/07/24 1549  BP: 121/69  Pulse: 74  Weight: 156 lb 12.8 oz (71.1 kg)  Height: 5\' 2"  (1.575 m)   Body mass index is 28.68 kg/m.     Wt Readings from Last 3 Encounters:  01/07/24 156 lb 12.8 oz (71.1 kg)  12/05/23 152 lb 8 oz (69.2 kg)  11/11/23 150 lb (68 kg)     Ht Readings from Last 3 Encounters:  01/07/24 5\' 2"  (1.575 m)  12/05/23 5\' 2"  (1.575 m)  11/11/23 5\' 2"  (1.575 m)      General: The patient is awake, alert and appears not in acute distress. The patient is well groomed. Head: Normocephalic, atraumatic. Neck is supple. The patient is awake, alert  and appears not in acute distress. The patient is well groomed.  Mallampati 3 ,  neck circumference:13.5 . Nasal airflow patent , .Retrognathia is seen.  Cardiovascular:  Regular rate and rhythm  without  murmurs or carotid bruit, and without distended neck veins. Respiratory: Lungs are clear to auscultation.  Skin:  Without evidence of ankle edema, or rash. Trunk: The patient's posture is erect.   NEUROLOGIC EXAM: The patient is awake and alert, oriented to place and time.   Memory subjective described as intact.  Attention span & concentration ability appears normal.  Speech is fluent,  without dysarthria, dysphonia or aphasia.  Mood and affect are appropriate.   Cranial nerves: no loss of smell or taste reported  Pupils are equal and briskly reactive to light. Funduscopic exam deferred. .  Extraocular movements in vertical and horizontal planes were intact and without nystagmus. No Diplopia. Visual fields by finger perimetry are intact. Hearing was intact to soft voice and finger rubbing.    Facial sensation intact to fine touch.  Facial motor strength is symmetric and tongue and uvula move midline.  Neck ROM : rotation, tilt and flexion extension were normal for age and shoulder shrug was symmetrical.    Motor exam:  Symmetric bulk, tone and ROM.   Normal tone without cog wheeling, symmetric grip strength .   Sensory:  Fine touch, pinprick and vibration were tested  and  normal.  Proprioception tested in the upper extremities was normal.   Coordination: Rapid alternating movements in the fingers/hands were of normal speed. No change in penmanship. No tremor at rest.  The Finger-to-nose maneuver was intact without evidence of ataxia, dysmetria but there is a mild action tremor.   Gait and station: Patient could rise unassisted from a seated position, walked without assistive device.  Stance is of normal width/ base.  Toe and heel walk were deferred.  Deep tendon reflexes: in  the  upper and lower extremities are symmetric and intact.  Babinski response was deferred    ASSESSMENT AND PLAN 68 y.o. year old female  here with:  Fatigue, struggles some nights to stay asleep or go to sleep, but present 5 nights a months . She related this to late in the day enjoyment of caffeine,     1) unusual  inner trembling when going to sleep and when waking up- not visible outside.   2) still snoring and sometimes waking up from snoring , some morning headaches,   Plan : lets screen by HST to to rule out hypoxia or apnea. Tamoxifen has contributed to some hot flashes, it may be the cause of trembling, too.   Reduce alcohol intake, I suspect that 2 drinks will be cause for snoring and choking. I see no essential or parkinsonian features. No cerebellar ataxia or coordination problem.    Take a  vitamin B complex or prenatal vitamins , hydrate well and spend time in daylight outside. I plan to follow up either personally or through our NP within 3-5 months.   I would like to thank Dr Drue Novel, MD ,and Clayborne Dana, Np [address] [place],  [place] [address] for allowing me to meet with and to take care of this pleasant patient.     After spending a total time of  45  minutes face to face and additional time for physical and neurologic examination, review of laboratory studies,  personal review of imaging studies, reports and results of other testing and review of referral information / records as far as provided in visit,   Electronically signed by: Melvyn Novas, MD 01/07/2024 4:12 PM  [facility] Board certified by The [employer] of Sleep Medicine and Diplomate of the [employer] of Sleep Medicine. Board certified In Neurology through the ABPN, Fellow of the [employer] of Neurology.

## 2024-01-16 ENCOUNTER — Ambulatory Visit (AMBULATORY_SURGERY_CENTER): Payer: Medicare HMO

## 2024-01-16 VITALS — Ht 62.0 in | Wt 150.0 lb

## 2024-01-16 DIAGNOSIS — Z860100 Personal history of colon polyps, unspecified: Secondary | ICD-10-CM

## 2024-01-16 MED ORDER — SUFLAVE 178.7 G PO SOLR: 1.0000 | ORAL | 0 refills | Status: DC

## 2024-01-16 NOTE — Progress Notes (Signed)

## 2024-01-21 ENCOUNTER — Encounter: Payer: Self-pay | Admitting: Family Medicine

## 2024-01-21 DIAGNOSIS — Z5329 Procedure and treatment not carried out because of patient's decision for other reasons: Secondary | ICD-10-CM | POA: Diagnosis not present

## 2024-01-21 DIAGNOSIS — R42 Dizziness and giddiness: Secondary | ICD-10-CM | POA: Diagnosis not present

## 2024-01-21 DIAGNOSIS — R55 Syncope and collapse: Secondary | ICD-10-CM | POA: Diagnosis not present

## 2024-01-21 DIAGNOSIS — R110 Nausea: Secondary | ICD-10-CM | POA: Diagnosis not present

## 2024-01-21 DIAGNOSIS — I959 Hypotension, unspecified: Secondary | ICD-10-CM | POA: Diagnosis not present

## 2024-01-21 DIAGNOSIS — Z743 Need for continuous supervision: Secondary | ICD-10-CM | POA: Diagnosis not present

## 2024-01-21 DIAGNOSIS — R079 Chest pain, unspecified: Secondary | ICD-10-CM | POA: Diagnosis not present

## 2024-01-23 ENCOUNTER — Ambulatory Visit (INDEPENDENT_AMBULATORY_CARE_PROVIDER_SITE_OTHER): Payer: Medicare HMO | Admitting: Family Medicine

## 2024-01-23 ENCOUNTER — Encounter: Payer: Self-pay | Admitting: Family Medicine

## 2024-01-23 VITALS — BP 126/57 | HR 85 | Ht 62.0 in | Wt 155.0 lb

## 2024-01-23 DIAGNOSIS — R55 Syncope and collapse: Secondary | ICD-10-CM | POA: Diagnosis not present

## 2024-01-23 DIAGNOSIS — E878 Other disorders of electrolyte and fluid balance, not elsewhere classified: Secondary | ICD-10-CM | POA: Diagnosis not present

## 2024-01-23 LAB — BASIC METABOLIC PANEL
BUN: 13 mg/dL (ref 6–23)
CO2: 29 meq/L (ref 19–32)
Calcium: 9.3 mg/dL (ref 8.4–10.5)
Chloride: 106 meq/L (ref 96–112)
Creatinine, Ser: 0.83 mg/dL (ref 0.40–1.20)
GFR: 73.52 mL/min (ref >60.00)
Glucose, Bld: 94 mg/dL (ref 70–99)
Potassium: 4.7 meq/L (ref 3.5–5.1)
Sodium: 144 meq/L (ref 135–145)

## 2024-01-23 NOTE — Progress Notes (Signed)
 Acute Office Visit  Subjective:     Patient ID: Debra Norton, female    DOB: Jul 03, 1956, 68 y.o.   MRN: [id number]  Chief Complaint  Patient presents with   Hospitalization Follow-up    HPI Patient is in today for ED follow-up  Discussed the use of AI scribe software for clinical note transcription with the patient, who gave verbal consent to proceed.  History of Present Illness   Debra Norton is a 68 year old female who presents for ED follow-up regarding dizziness and lightheadedness.  She experienced an episode of dizziness and lightheadedness on Wednesday while in the bathroom, feeling dizzy, sweaty, and having a bad stomach feeling after consuming a new [employer] drink containing green tea. She did not pass out but felt unwell enough to visit [facility]. Blood tests, including blood counts and metabolic panel, were normal except for a slightly elevated chloride level. Two EKGs were performed, both of which were normal per patient. She left the hospital after waiting for two hours without being seen further as she felt better.  She mentioned experiencing similar episodes approximately once a year, with the last occurrence happening a year ago after breast cancer surgery. During that episode, she felt dizzy while on the toilet, attempted to stand, and ended up with a black eye from falling.   She reported a sensation of head pressure starting the day before the visit, which she initially attributed to excessive sleep. However, she experienced poor sleep the following night. No sinus pressure, runny nose, or vision changes.   She has new glasses that seem to cause irritation behind her ear, despite using coconut oil as suggested. She has not worn the new glasses much due to discomfort and has reverted to using her old pair.           ROS All review of systems negative except what is listed in the HPI      Objective:    BP (!) 126/57    Pulse 85   Ht 5' 2 (1.575 m)   Wt 155 lb (70.3 kg)   SpO2 97%   BMI 28.35 kg/m      Physical Exam Vitals reviewed.  Constitutional:      Appearance: Normal appearance.  Cardiovascular:     Rate and Rhythm: Normal rate and regular rhythm.     Heart sounds: Normal heart sounds.  Pulmonary:     Effort: Pulmonary effort is normal.     Breath sounds: Normal breath sounds.  Skin:    General: Skin is warm and dry.  Neurological:     Mental Status: She is alert and oriented to person, place, and time.  Psychiatric:        Mood and Affect: Mood normal.        Behavior: Behavior normal.        Thought Content: Thought content normal.        Judgment: Judgment normal.       No results found for any visits on 01/23/24.      Assessment & Plan:   Problem List Items Addressed This Visit   None Visit Diagnoses       Vaso vagal episode    -  Primary   Relevant Orders   Basic metabolic panel     Electrolyte abnormality       Relevant Orders   Basic metabolic panel         Presyncope Recent episode of  lightheadedness and dizziness during a bowel movement, possibly related to a vagal response. No loss of consciousness. Labs and EKG at the hospital were unremarkable. -Advise to stay hydrated and avoid straining during bowel movements. -Repeat basic metabolic panel to ensure electrolytes are within normal limits.  Headache Recent onset of generalized head pressure, possibly related to sinus congestion or barometric changes. -Advise to use Flonase  or Nasacort  -Recommend warm compresses, steam showers, and over-the-counter pain relievers as needed. -Follow-up if not improving.   Skin Irritation Irritation behind the ear due to friction from glasses. -Recommend application of Aquaphor to the affected area. -Advise to monitor for signs of infection such as redness or drainage.        No orders of the defined types were placed in this encounter.   Return if  symptoms worsen or fail to improve.  Waddell KATHEE Mon, NP

## 2024-01-26 ENCOUNTER — Encounter: Payer: Self-pay | Admitting: Family Medicine

## 2024-01-29 ENCOUNTER — Encounter: Payer: Self-pay | Admitting: Family Medicine

## 2024-01-29 DIAGNOSIS — K9041 Non-celiac gluten sensitivity: Secondary | ICD-10-CM

## 2024-01-29 NOTE — Addendum Note (Signed)
Addended by: Hyman Hopes B on: 01/29/2024 03:05 PM   Modules accepted: Orders

## 2024-01-30 ENCOUNTER — Encounter: Payer: Self-pay | Admitting: Gastroenterology

## 2024-01-30 ENCOUNTER — Ambulatory Visit: Payer: Medicare HMO | Admitting: Gastroenterology

## 2024-01-30 VITALS — BP 127/64 | HR 67 | Temp 97.0°F | Resp 11 | Ht 62.0 in | Wt 150.0 lb

## 2024-01-30 DIAGNOSIS — K635 Polyp of colon: Secondary | ICD-10-CM

## 2024-01-30 DIAGNOSIS — D123 Benign neoplasm of transverse colon: Secondary | ICD-10-CM | POA: Diagnosis not present

## 2024-01-30 DIAGNOSIS — K5730 Diverticulosis of large intestine without perforation or abscess without bleeding: Secondary | ICD-10-CM

## 2024-01-30 DIAGNOSIS — Z1211 Encounter for screening for malignant neoplasm of colon: Secondary | ICD-10-CM

## 2024-01-30 DIAGNOSIS — F419 Anxiety disorder, unspecified: Secondary | ICD-10-CM | POA: Diagnosis not present

## 2024-01-30 DIAGNOSIS — D124 Benign neoplasm of descending colon: Secondary | ICD-10-CM | POA: Diagnosis not present

## 2024-01-30 DIAGNOSIS — Z860100 Personal history of colon polyps, unspecified: Secondary | ICD-10-CM

## 2024-01-30 DIAGNOSIS — K648 Other hemorrhoids: Secondary | ICD-10-CM

## 2024-01-30 DIAGNOSIS — G4730 Sleep apnea, unspecified: Secondary | ICD-10-CM | POA: Diagnosis not present

## 2024-01-30 DIAGNOSIS — F32A Depression, unspecified: Secondary | ICD-10-CM | POA: Diagnosis not present

## 2024-01-30 MED ORDER — SODIUM CHLORIDE 0.9 % IV SOLN: 500.0000 mL | Freq: Once | INTRAVENOUS | Status: AC

## 2024-01-30 NOTE — Op Note (Signed)
[facility] Patient Name: Debra Norton Procedure Date: 01/30/2024 9:07 AM MRN: [id number] Endoscopist: Viviann Spare P. Adela Lank , MD, [id number] Age: 68 Referring MD:  Date of Birth: 13-Feb-1956 Gender: Female Account #: [id number] Procedure:                Colonoscopy Indications:              High risk colon cancer surveillance: Personal                            history of colonic polyps - adenoma / SSP removed                            11/2018, mother had colon cancer dx < age 79 Medicines:                Monitored Anesthesia Care Procedure:                Pre-Anesthesia Assessment:                           - Prior to the procedure, a History and Physical                            was performed, and patient medications and                            allergies were reviewed. The patient's tolerance of                            previous anesthesia was also reviewed. The risks                            and benefits of the procedure and the sedation                            options and risks were discussed with the patient.                            All questions were answered, and informed consent                            was obtained. Prior Anticoagulants: The patient has                            taken no anticoagulant or antiplatelet agents. ASA                            Grade Assessment: II - A patient with mild systemic                            disease. After reviewing the risks and benefits,                            the patient was deemed in satisfactory condition to  undergo the procedure.                           After obtaining informed consent, the colonoscope                            was passed under direct vision. Throughout the                            procedure, the patient's blood pressure, pulse, and                            oxygen saturations were monitored continuously. The                             Olympus Scope [id number] was introduced through the                            anus and advanced to the the cecum, identified by                            appendiceal orifice and ileocecal valve. The                            colonoscopy was performed without difficulty. The                            patient tolerated the procedure well. The quality                            of the bowel preparation was good. The ileocecal                            valve, appendiceal orifice, and rectum were                            photographed. Scope In: 9:13:54 AM Scope Out: 9:29:30 AM Scope Withdrawal Time: 0 hours 11 minutes 45 seconds  Total Procedure Duration: 0 hours 15 minutes 36 seconds  Findings:                 The perianal and digital rectal examinations were                            normal.                           A 7 mm polyp was found in the transverse colon. The                            polyp was flat. The polyp was removed with a cold                            snare. Resection and retrieval were complete.  A 4 mm polyp was found in the descending colon. The                            polyp was flat. The polyp was removed with a cold                            snare. Resection and retrieval were complete.                           Multiple small-mouthed diverticula were found in                            the transverse colon and left colon.                           Internal hemorrhoids were found during                            retroflexion. The hemorrhoids were small.                           The exam was otherwise without abnormality. Complications:            No immediate complications. Estimated blood loss:                            Minimal. Estimated Blood Loss:     Estimated blood loss was minimal. Impression:               - One 7 mm polyp in the transverse colon, removed                            with a cold snare. Resected and  retrieved.                           - One 4 mm polyp in the descending colon, removed                            with a cold snare. Resected and retrieved.                           - Diverticulosis in the transverse colon and in the                            left colon.                           - Internal hemorrhoids.                           - The examination was otherwise normal. Recommendation:           - Patient has a contact number available for  emergencies. The signs and symptoms of potential                            delayed complications were discussed with the                            patient. Return to normal activities tomorrow.                            Written discharge instructions were provided to the                            patient.                           - Resume previous diet.                           - Continue present medications.                           - Await pathology results. Viviann Spare P. Tasharra Nodine, MD 01/30/2024 9:34:13 AM This report has been signed electronically.

## 2024-01-30 NOTE — Progress Notes (Signed)
Called to room to assist during endoscopic procedure.  Patient ID and intended procedure confirmed with present staff. Received instructions for my participation in the procedure from the performing physician.

## 2024-01-30 NOTE — Progress Notes (Signed)
To pacu, VSS. Report to RN.tb

## 2024-01-30 NOTE — Progress Notes (Signed)
[facility] History and Physical   Primary Care Physician:  Debra Dana, Norton   Reason for Procedure:   History of colon polyps  Plan:    colonoscopy     HPI: Debra Norton is a 68 y.o. female  here for colonoscopy surveillance - adenoma / SSP removed 11/2018. Mother had CRC dx age < 34.   Patient denies any bowel symptoms at this time. Otherwise feels well without any cardiopulmonary symptoms.   I have discussed risks / benefits of anesthesia and endoscopic procedure with Debra Norton and they wish to proceed with the exams as outlined today.    Past Medical History:  Diagnosis Date   Allergy    Anxiety    Arthritis    Cancer (HCC)    Cervical dysplasia    Cholelithiasis    14mm stone seen on ultrasound in 2009   Colon polyp    Complication of anesthesia    pain was not controlled in PACU- had to be admitted   Depression    Diverticulitis    Migraine headache    years ago   Sleep apnea    Vertigo     Past Surgical History:  Procedure Laterality Date   BREAST LUMPECTOMY WITH RADIOACTIVE SEED LOCALIZATION Right 01/30/2023   Procedure: RIGHT BREAST LUMPECTOMY WITH RADIOACTIVE SEED LOCALIZATION;  Surgeon: Debra Norton;  Location: [facility] OR;  Service: General;  Laterality: Right;   BREAST SURGERY     CHOLECYSTECTOMY     COLPOSCOPY     ECTOPIC PREGNANCY SURGERY     EXTERNAL EAR SURGERY Left    EYE SURGERY     GYNECOLOGIC CRYOSURGERY      Prior to Admission medications   Medication Sig Start Date End Date Taking? Authorizing Provider  tamoxifen (NOLVADEX) 10 MG tablet Take 0.5 tablets (5 mg total) by mouth daily. 04/09/23  Yes Debra Norton  ALPRAZolam Prudy Feeler) 0.25 MG tablet Take 1 tablet (0.25 mg total) by mouth at bedtime as needed for anxiety. 12/05/23   Debra Norton    Current Outpatient Medications  Medication Sig Dispense Refill   tamoxifen (NOLVADEX) 10 MG tablet Take 0.5 tablets (5 mg total) by mouth  daily. 90 tablet 1   ALPRAZolam (XANAX) 0.25 MG tablet Take 1 tablet (0.25 mg total) by mouth at bedtime as needed for anxiety. 30 tablet 0   Current Facility-Administered Medications  Medication Dose Route Frequency Provider Last Rate Last Admin   0.9 %  sodium chloride infusion  500 mL Intravenous Once Debra Norton        Allergies as of 01/30/2024 - Review Complete 01/30/2024  Allergen Reaction Noted   Diphenhydramine hcl Other (See Comments) 06/26/2010   Penicillins Hives    Clindamycin/lincomycin Rash 05/10/2015   Flagyl [metronidazole] Nausea And Vomiting 09/24/2018   Hydrocodone  09/01/2012   Tetanus toxoid Other (See Comments)     Family History  Problem Relation Age of Onset   Colon cancer Mother 60 - 26   Anxiety disorder Mother    Asthma Mother    Cancer Mother    CAD Father        83   Diabetes Sister    Early death Sister    Ovarian cancer Cousin        maternal first cousin   Breast cancer Neg Hx    Esophageal cancer Neg Hx    Pancreatic cancer Neg Hx    Rectal cancer Neg Hx  Stomach cancer Neg Hx     Social History   Socioeconomic History   Marital status: Widowed    Spouse name: Not on file   Number of children: 0   Years of education: Not on file   Highest education level: Associate degree: occupational, Scientist, product/process development, or vocational program  Occupational History   Occupation: [employer], 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  Tobacco Use   Smoking status: Former    Current packs/day: 0.00    Types: Cigarettes    Start date: 7    Quit date: 05/16/1977    Years since quitting: 46.7   Smokeless tobacco: Never   Tobacco comments:    quit in high School  Vaping Use   Vaping status: Never Used  Substance and Sexual Activity   Alcohol use: Yes    Alcohol/week: 2.0 standard drinks of alcohol    Types: 2 Standard drinks or equivalent per week   Drug use: Not Currently   Sexual activity: Yes    Birth control/protection: None    Comment: INTERCOURSE AGE 66, SEXUAL  PARTNERS  MORE THAN 5  Other Topics Concern   Not on file  Social History Narrative   Lives with boy friend   G4 P0    miscarriage x 3    ectopic x 1    Social Drivers of Corporate investment banker Strain: Low Risk  (01/22/2024)   Overall Financial Resource Strain (CARDIA)    Difficulty of Paying Living Expenses: Not hard at all  Food Insecurity: No Food Insecurity (01/22/2024)   Hunger Vital Sign    Worried About Running Out of Food in the Last Year: Never true    Ran Out of Food in the Last Year: Never true  Transportation Needs: No Transportation Needs (01/22/2024)   PRAPARE - Administrator, Civil Service (Medical): No    Lack of Transportation (Non-Medical): No  Physical Activity: Insufficiently Active (01/22/2024)   Exercise Vital Sign    Days of Exercise per Week: 2 days    Minutes of Exercise per Session: 20 min  Stress: No Stress Concern Present (01/22/2024)   [employer] of Occupational Health - Occupational Stress Questionnaire    Feeling of Stress : Not at all  Social Connections: Moderately Isolated (01/22/2024)   Social Connection and Isolation Panel [NHANES]    Frequency of Communication with Friends and Family: More than three times a week    Frequency of Social Gatherings with Friends and Family: Three times a week    Attends Religious Services: Never    Active Member of Clubs or Organizations: No    Attends Banker Meetings: 1 to 4 times per year    Marital Status: Widowed  Intimate Partner Violence: Not At Risk (11/11/2023)   Humiliation, Afraid, Rape, and Kick questionnaire    Fear of Current or Ex-Partner: No    Emotionally Abused: No    Physically Abused: No    Sexually Abused: No    Review of Systems: All other review of systems negative except as mentioned in the HPI.  Physical Exam: Vital signs BP 132/65   Pulse (!) 103   Temp (!) 97 F (36.1 C) (Skin)   Ht 5\' 2"  (1.575 m)   Wt 150 lb (68 kg)   SpO2 96%   BMI 27.44  kg/m   General:   Alert,  Well-developed, pleasant and cooperative in NAD Lungs:  Clear throughout to auscultation.   Heart:  Regular rate and rhythm Abdomen:  Soft, nontender and nondistended.   Neuro/Psych:  Alert and cooperative. Normal mood and affect. A and O x 3  Debra Rain, Norton [facility]

## 2024-01-30 NOTE — Patient Instructions (Signed)
Handouts Provided:  Polyps and Diverticulosis  YOU HAD AN ENDOSCOPIC PROCEDURE TODAY AT THE [facility]:   Refer to the procedure report that was given to you for any specific questions about what was found during the examination.  If the procedure report does not answer your questions, please call your gastroenterologist to clarify.  If you requested that your care partner not be given the details of your procedure findings, then the procedure report has been included in a sealed envelope for you to review at your convenience later.  YOU SHOULD EXPECT: Some feelings of bloating in the abdomen. Passage of more gas than usual.  Walking can help get rid of the air that was put into your GI tract during the procedure and reduce the bloating. If you had a lower endoscopy (such as a colonoscopy or flexible sigmoidoscopy) you may notice spotting of blood in your stool or on the toilet paper. If you underwent a bowel prep for your procedure, you may not have a normal bowel movement for a few days.  Please Note:  You might notice some irritation and congestion in your nose or some drainage.  This is from the oxygen used during your procedure.  There is no need for concern and it should clear up in a day or so.  SYMPTOMS TO REPORT IMMEDIATELY:  Following lower endoscopy (colonoscopy or flexible sigmoidoscopy):  Excessive amounts of blood in the stool  Significant tenderness or worsening of abdominal pains  Swelling of the abdomen that is new, acute  Fever of 100F or higher  For urgent or emergent issues, a gastroenterologist can be reached at any hour by calling [phone]. Do not use MyChart messaging for urgent concerns.    DIET:  We do recommend a small meal at first, but then you may proceed to your regular diet.  Drink plenty of fluids but you should avoid alcoholic beverages for 24 hours.  ACTIVITY:  You should plan to take it easy for the rest of today and you should NOT DRIVE  or use heavy machinery until tomorrow (because of the sedation medicines used during the test).    FOLLOW UP: Our staff will call the number listed on your records the next business day following your procedure.  We will call around 7:15- 8:00 am to check on you and address any questions or concerns that you may have regarding the information given to you following your procedure. If we do not reach you, we will leave a message.     If any biopsies were taken you will be contacted by phone or by letter within the next 1-3 weeks.  Please call us at [phone] if you have not heard about the biopsies in 3 weeks.    SIGNATURES/CONFIDENTIALITY: You and/or your care partner have signed paperwork which will be entered into your electronic medical record.  These signatures attest to the fact that that the information above on your After Visit Summary has been reviewed and is understood.  Full responsibility of the confidentiality of this discharge information lies with you and/or your care-partner.

## 2024-01-30 NOTE — Progress Notes (Signed)
Pt's states no medical or surgical changes since previsit or office visit.

## 2024-02-02 ENCOUNTER — Other Ambulatory Visit (INDEPENDENT_AMBULATORY_CARE_PROVIDER_SITE_OTHER): Payer: Medicare HMO

## 2024-02-02 ENCOUNTER — Telehealth: Payer: Self-pay | Admitting: *Deleted

## 2024-02-02 DIAGNOSIS — K9041 Non-celiac gluten sensitivity: Secondary | ICD-10-CM | POA: Diagnosis not present

## 2024-02-02 NOTE — Telephone Encounter (Signed)
  Follow up Call-     01/30/2024    8:07 AM  Call back number  Post procedure Call Back phone  # [phone]  Permission to leave phone message Yes   [facility]

## 2024-02-03 LAB — CELIAC AB TTG DGP TIGA
Antigliadin Abs, IgA: 3 U (ref 0–19)
Gliadin IgG: 3 U (ref 0–19)
IgA/Immunoglobulin A, Serum: 112 mg/dL (ref 87–352)
Tissue Transglut Ab: 3 U/mL (ref 0–5)
Transglutaminase IgA: <2 U/mL (ref 0–3)

## 2024-02-03 LAB — SURGICAL PATHOLOGY

## 2024-02-04 ENCOUNTER — Encounter: Payer: Self-pay | Admitting: Family Medicine

## 2024-02-04 ENCOUNTER — Encounter: Payer: Self-pay | Admitting: Gastroenterology

## 2024-02-10 ENCOUNTER — Ambulatory Visit: Payer: Medicare HMO | Admitting: Neurology

## 2024-02-10 DIAGNOSIS — G4720 Circadian rhythm sleep disorder, unspecified type: Secondary | ICD-10-CM

## 2024-02-10 DIAGNOSIS — G4733 Obstructive sleep apnea (adult) (pediatric): Secondary | ICD-10-CM | POA: Diagnosis not present

## 2024-02-10 DIAGNOSIS — R0683 Snoring: Secondary | ICD-10-CM

## 2024-02-10 DIAGNOSIS — R232 Flushing: Secondary | ICD-10-CM

## 2024-02-10 DIAGNOSIS — Z9189 Other specified personal risk factors, not elsewhere classified: Secondary | ICD-10-CM

## 2024-02-10 DIAGNOSIS — G4700 Insomnia, unspecified: Secondary | ICD-10-CM

## 2024-02-10 DIAGNOSIS — R251 Tremor, unspecified: Secondary | ICD-10-CM

## 2024-02-10 DIAGNOSIS — T451X5A Adverse effect of antineoplastic and immunosuppressive drugs, initial encounter: Secondary | ICD-10-CM

## 2024-02-11 NOTE — Progress Notes (Signed)
 [facility]  Debra Norton 68 year old female [birth year]   HOME SLEEP TEST REPORT ( by Watch PAT)   STUDY DATE:  2-25 through 02-11-2024   ORDERING CLINICIAN:  Melvyn Novas, MD   REFERRING CLINICIAN: Clayborne Dana, NP [address] [place] [address]    CLINICAL INFORMATION/HISTORY: 1.22.2025,Debra Norton is a 68 y.o. female patient who is seen on 01/07/2024 , referral from NP Reola Calkins, office of Dr Leta Jungling, for a new evaluation of fatigue,  interval hx of surgery and radiation last year (2024) for breast cancer.  She is still working full time and she has been declared cancer free.  She is on tamoxifen. Had some hot flushes but not affecting sleep more than before dx.    Chief concern according to patient :  " I have developed tremors from my waist up, a mild shaking when I go to sleep or wake up from sleep" . These tremors are felt, not seen.  She also has some pain in the right leg, only when at rest.  No irresistible urge to move,  the sensation passes.  Sometimes still waking up from her own snoring. Headaches when waking up. No changes in voice, swallowing.    Last visit 6 years ago : Debra Norton is a 68 y.o. female , seen here as in a referral from Dr. Drue Novel for a sleep evaluation, The patient reports that she underwent at least 2 sleep studies, one at the [facility] long hospital annex, another one at Dr. Parke Poisson private sleep lab on [address]. [facility]. She continues to feel that her sleep has a low quality is not sound and struggles with insomnia.  She also reports memory loss respiratory allergies, snoring, daytime fatigue recent surgeries she had a gallbladder surgery just earlier this year. It was right after the procedure under anesthesia that she was again witnessed to trouble with apnea, very low oxygen saturations, and was asked to follow-up here again. the anesthesiologist made this an urgent OSA  evaluation     Epworth sleepiness score:5/ 24 points,  FSS endorsed at 30/ 63 points, GDS 3/ 15    BMI:  28.8 kg/m   Neck Circumference: 13.5 "   FINDINGS:   Sleep Summary:   Total Recording Time (hours, min):   10 h 10 m     Total Sleep Time (hours, min):   8 h and 18 m               Percent REM (%):  22.2        High degree of sleep fragmentation.                                  Respiratory Indices: the given AHI is based on AASM criteria , the second value  calculated by applying CMS criteria-    Calculated pAHI (per hour):    8.5/h  ( 3.8/h)                          REM pAHI:   16.7/h  ( 8.4/h)  NREM pAHI:  6.2/h ( 2.5/h)                              Positional supine AHI  12.3/h and CMS AHI was 4/9 /h  AHI:  non supine  in prone and right lateral sleep was above 5 /h and CMS AHI was 3.4/h.                                                  Oxygen Saturation Statistics:   Oxygen Saturation (%) Mean:    94%                   O2 Saturation Range (%):    71 % through 99%                                    O2 Saturation (minutes) <89%:  0.2 m           Pulse Rate Statistics:   Pulse Mean (bpm):  61 bpm               Pulse Range:  between 49 and 96 bpm                IMPRESSION:  This HST confirms the presence of mild all obstructive apnea when applying AASM criteria, but no dx of OSA an be made under CMS criteria.  The AHI is under 5/h when scored by 4% desaturation CMS guidelines.    RECOMMENDATION: Without reaching the dx criteria for OSA , there is still a clear REM sleep accentuation and supine sleep accentuation noticed. The goal would be to avoid supine sleep. The intermittent bradycardia was not clinically severe and no significant hypoxia was noted.   I can offer the patient a return for an in lab sleep study to evaluate her sleep architecture and frequent arousals in more detail. Based on the HST , thee would not  be an apnea related contribution to the sleep arousals, but her reported tremor sensation and possible cardiac arrhythmias are to be evaluated more deeply in an in lab study.     INTERPRETING PHYSICIAN:   Melvyn Novas, MD  [facility] Sleep Board certified by The [employer] of Sleep Medicine and Diplomate of the [employer] of Sleep Medicine. Board certified In Neurology through the ABPN, Fellow of the [employer] of Neurology.

## 2024-02-17 ENCOUNTER — Encounter: Payer: Self-pay | Admitting: Neurology

## 2024-02-17 NOTE — Addendum Note (Signed)
 Addended by: Melvyn Novas on: 02/17/2024 07:53 AM   Modules accepted: Orders

## 2024-02-17 NOTE — Procedures (Signed)
 [facility]  Debra Norton 68 year old female December 09, 1956   HOME SLEEP TEST REPORT ( by Watch PAT)   STUDY DATE:  2-25 through 02-11-2024   ORDERING CLINICIAN:  Melvyn Novas, MD   REFERRING CLINICIAN: Clayborne Dana, NP [address] [place] [address]    CLINICAL INFORMATION/HISTORY: 1.22.2025,Debra Norton is a 68 y.o. female patient who is seen on 01/07/2024 , referral from NP Reola Calkins, office of Dr Leta Jungling, for a new evaluation of fatigue,  interval hx of surgery and radiation last year (2024) for breast cancer.  She is still working full time and she has been declared cancer free.  She is on tamoxifen. Had some hot flushes but not affecting sleep more than before dx.    Chief concern according to patient :  " I have developed tremors from my waist up, a mild shaking when I go to sleep or wake up from sleep" . These tremors are felt, not seen.  She also has some pain in the right leg, only when at rest.  No irresistible urge to move,  the sensation passes.  Sometimes still waking up from her own snoring. Headaches when waking up. No changes in voice, swallowing.    Last visit 6 years ago : Debra Norton is a 68 y.o. female , seen here as in a referral from Dr. Drue Novel for a sleep evaluation, The patient reports that she underwent at least 2 sleep studies, one at the [facility] long hospital annex, another one at Dr. Parke Poisson private sleep lab on [address]. [facility]. She continues to feel that her sleep has a low quality is not sound and struggles with insomnia.  She also reports memory loss respiratory allergies, snoring, daytime fatigue recent surgeries she had a gallbladder surgery just earlier this year. It was right after the procedure under anesthesia that she was again witnessed to trouble with apnea, very low oxygen saturations, and was asked to follow-up here again. the anesthesiologist made this an urgent OSA evaluation      Epworth sleepiness score:5/ 24 points,  FSS endorsed at 30/ 63 points, GDS 3/ 15    BMI:  28.8 kg/m   Neck Circumference: 13.5 "   FINDINGS:   Sleep Summary:   Total Recording Time (hours, min):   10 h 10 m     Total Sleep Time (hours, min):   8 h and 18 m               Percent REM (%):  22.2        High degree of sleep fragmentation.       Snoring  reached a mean Volume of 42 dB.                               Respiratory Indices: the given AHI is based on AASM criteria , the second value  calculated by applying CMS criteria-    Calculated pAHI (per hour):    8.5/h  ( 3.8/h)                          REM pAHI:   16.7/h  ( 8.4/h)  NREM pAHI:  6.2/h ( 2.5/h)                              Positional supine AHI  12.3/h and CMS AHI was 4/9 /h  AHI:  non supine  in prone and right lateral sleep was above 5 /h and CMS AHI was 3.4/h.                                                  Oxygen Saturation Statistics:   Oxygen Saturation (%) Mean:    94%                   O2 Saturation Range (%):    71 % through 99%                                    O2 Saturation (minutes) <89%:  0.2 m           Pulse Rate Statistics:   Pulse Mean (bpm):  61 bpm               Pulse Range:  between 49 and 96 bpm                IMPRESSION:  This HST confirms the presence of mild all obstructive apnea when applying AASM criteria, but no dx of OSA an be made under CMS criteria.  The AHI is under 5/h when scored by 4% desaturation CMS guidelines.    RECOMMENDATION: Without reaching the dx criteria for OSA , there is still a clear REM sleep accentuation and supine sleep accentuation noticed. The goal would be to avoid supine sleep. The intermittent bradycardia was not clinically severe and no significant hypoxia was noted.  The patient experienced the least frequent apnea events while in prone position.   I can offer the patient a return for an in lab sleep  study to evaluate her sleep architecture and frequent arousals in more detail. Based on the HST , thee would not be an apnea related contribution to the sleep arousals, but  reported trembling sensation and possible cardiac arrhythmias are to be evaluated more deeply in an in lab study.     INTERPRETING PHYSICIAN:   Melvyn Novas, MD  [facility] Sleep Board certified by The [employer] of Sleep Medicine and Diplomate of the [employer] of Sleep Medicine. Board certified In Neurology through the ABPN, Fellow of the [employer] of Neu[employer].

## 2024-02-23 ENCOUNTER — Telehealth: Payer: Self-pay | Admitting: Neurology

## 2024-02-23 NOTE — Telephone Encounter (Signed)
 [employer] [employer]na medicare pending.

## 2024-03-01 NOTE — Telephone Encounter (Signed)
 [employer]na medicare Berkley Harvey: [id number] (exp. 02/23/24 to 08/23/24)

## 2024-03-01 NOTE — Telephone Encounter (Signed)
 NPSG [employer] Berkley Harvey: [id number] (exp. 02/23/24 to 08/23/24) & [employer] no auth req   Patient is scheduled at [facility] for 03/29/2024 at 9 pm.  Mailed packet & sent mychart

## 2024-03-29 ENCOUNTER — Encounter

## 2024-03-31 NOTE — Telephone Encounter (Signed)
 Patient missed her 03/29/24 SS appt. She called to r/s.  She is r/s for 04/20/24 at 9 pm.  Mailed packet and sent mychart.

## 2024-04-19 ENCOUNTER — Encounter: Payer: Self-pay | Admitting: Family Medicine

## 2024-04-20 ENCOUNTER — Ambulatory Visit (INDEPENDENT_AMBULATORY_CARE_PROVIDER_SITE_OTHER): Admitting: Neurology

## 2024-04-20 DIAGNOSIS — G4720 Circadian rhythm sleep disorder, unspecified type: Secondary | ICD-10-CM

## 2024-04-20 DIAGNOSIS — G4700 Insomnia, unspecified: Secondary | ICD-10-CM

## 2024-04-20 DIAGNOSIS — R251 Tremor, unspecified: Secondary | ICD-10-CM

## 2024-04-20 DIAGNOSIS — R0683 Snoring: Secondary | ICD-10-CM

## 2024-04-20 DIAGNOSIS — T451X5A Adverse effect of antineoplastic and immunosuppressive drugs, initial encounter: Secondary | ICD-10-CM

## 2024-04-27 ENCOUNTER — Ambulatory Visit: Payer: Self-pay | Admitting: Neurology

## 2024-04-27 DIAGNOSIS — R251 Tremor, unspecified: Secondary | ICD-10-CM

## 2024-04-27 DIAGNOSIS — Z9189 Other specified personal risk factors, not elsewhere classified: Secondary | ICD-10-CM

## 2024-04-27 DIAGNOSIS — G4700 Insomnia, unspecified: Secondary | ICD-10-CM | POA: Insufficient documentation

## 2024-04-27 DIAGNOSIS — R0683 Snoring: Secondary | ICD-10-CM

## 2024-04-27 DIAGNOSIS — T451X5A Adverse effect of antineoplastic and immunosuppressive drugs, initial encounter: Secondary | ICD-10-CM

## 2024-04-27 NOTE — Procedures (Signed)
 [facility] POLYSOMNOGRAPHY  INTERPRETATION REPORT   STUDY DATE:  04/20/2024     PATIENT NAME:  Debra Norton         DATE OF BIRTH:  1956-08-18  PATIENT ID:  [id number]    TYPE OF STUDY:  PSG  READING PHYSICIAN: Neomia Banner, MD REFERRED BY: Dr Neomi Banks, NP Albertine Hugh TECHNICIAN: Auston Left, RPSGT   HISTORY:  Debra Norton  is a 68 year-old Female patient  with tremor, and has a history of disturbed sleep . her most recent sleep study was a HST earlier in 2025 which was negative for dx of OSA - the CMS criteria of scoring reached an AHI of under 5/h. while the oxygen nadir was 71 % with REM sleep accentuated hypoxia and fragmented sleep were noted.   We offered a laboratory-based PSG study to look at sleep disordered breathing in more detail. A sleep aid had been prescribed to be used in the sleep lab.  Debra Norton is a 68 y.o. female patient who is seen on 01/07/2024 , referral from NP Kerrie Peek, office of Dr Clearnce Curia, for a new evaluation of fatigue,  interval hx of surgery and radiation last year (2024) for newly dx breast cancer.  She is working full time and she has been declared cancer free.  She is on tamoxifen . Had some hot flushes but not affecting sleep more than before dx.   According to patient:  " I have developed tremors from my waist up, a mild shaking when I go to sleep or wake up from sleep". These tremors are felt, not seen on the outside.  She also has some pain in the right leg, and only when at rest.  No irresistible urge to move is associated,  the sensation passes.  Sometimes still waking up from her own snoring. Headaches when waking up. No changes in voice, swallowing.   ADDITIONAL INFORMATION:  The Epworth Sleepiness Scale was endorsed at  5 /24 points (scores above or equal to 10 are suggestive of hypersomnolence). FSS endorsed at 30  /63 points. GDS 3/ 15 points.    Height: 62 in Weight: 156 lbs (BMI 28) Neck Size:  14 in  MEDICATIONS: Xanax , Nolvadex    TECHNICAL DESCRIPTION: A registered sleep technologist ( RPSGT)  was in attendance for the duration of the recording.  Data collection, scoring, video monitoring, and reporting were performed in compliance with the AASM Manual for the Scoring of Sleep and Associated Events; (Hypopnea is scored based on the CMS criteria listed in Section VIII D. 1b in the AASM Manual V2.6 using a 4% oxygen desaturation rule or Hypopnea is scored based on the criteria listed in Section VIII D. 1a in the AASM Manual V2.6 using 3% oxygen desaturation and /or arousal rule).   SLEEP CONTINUITY AND SLEEP ARCHITECTURE:  Lights-out was at 22:01: and lights-on at  05:10:, with  7.2 minutes of recording time . Total sleep time ( TST) was 285.5 minutes with a decreased sleep efficiency at 66.6%.   Sleep latency was increased at 48.0 minutes.  REM sleep latency was normal at 87.0 minutes. Of the total sleep time, the percentage of stage N1 sleep was 5.1%, stage N2 sleep was 67%, stage N3 sleep was 14.2%, and REM sleep was 13.7%.  There were 2 Stage R periods observed on this study night, 27 awakenings (i.e. transitions to Stage W from any sleep stage), and 79 total stage transitions. Wake after sleep onset (WASO) time accounted  for 95.5 minutes .   BODY POSITION:  TST was divided between the following sleep positions: : supine sleep was seen for 20 minutes (7%), and non-supine sleep for  265 minutes (93%); this divided into right 145 minutes (51%), left 120 minutes (42%), and prone sleep for  00 minutes (0%).  No supine REM sleep was seen.   RESPIRATORY MONITORING:  * Based on CMS criteria (using a 4% oxygen desaturation rule for scoring hypopneas), there was 1 apnea (1 obstructive; 0 central; 0 mixed), and 14 hypopneas.  Apnea index was 0.2. Hypopnea index was 2.9.  The apnea-hypopnea index was 3.2 overall (0.0 supine, 17/h  non-supine; 16.9/h  REM,).  There were 0 respiratory effort-related  arousals (RERAs)  Based on AASM criteria (using a 3% oxygen desaturation and /or arousal rule for scoring hypopneas), there were 1 apneas (1 obstructive; 0 central; 0 mixed), and 31 hypopneas. Apnea index was 0.2. Hypopnea index was 6.5.  The apnea-hypopnea index was 6.7 overall (5.9 supine, 29 non-supine; 29.2 REM, 0.0 supine REM). OXIMETRY: Oxyhemoglobin Saturation Nadir during sleep was at  90%  from a mean of 95%.  Of the Total sleep time (TST),  hypoxemia (<89%) was present for  0.0 minutes, or 0.0% of total sleep time.  LIMB MOVEMENTS: There were 0 periodic limb movements of sleep. AROUSAL: There were 79 arousals in total, for an arousal index of 12 /hour.  Of these, 8 were identified as respiratory-related arousals (2 /h), 0 were PLM-related arousals (0 /h), and 71 were non-specific arousals (15 /h). There were 0 occurrences of Cheyne Stokes breathing.   Snoring was classified as moderately loud. EEG:  PSG EEG was of normal amplitude and frequency, with symmetric manifestation of sleep stages. EKG: The electrocardiogram documented a normal sinus rhythm.  The average heart rate during sleep was 66 bpm.  The heart rate during sleep varied between a minimum of  58 bpm and  a maximum of  86 bpm. AUDIO and VIDEO:  No complex movements, vocalizations or dream enactment were captured.   IMPRESSION: Sleep disordered breathing was too mild to be classified as OSA following CMS criteria of scoring at 4% desaturation. No significant hypoxia was seen. Moderate snoring was recorded.  Hypoxia clustered in REM sleep.     2) Periodic limb movements were not present, neither were tremor or myoclonus. 3) Sleep efficiency was low . Sleep fragmentation  was  present with many spontaneous arousals - The majority of sleep arousals was  unrelated to physiological causes.  Total sleep time was reduced at 285.5 minutes.  Sleep efficiency was decreased at 66.6%.      RECOMMENDATIONS:  While there is snoring  present and hypopneas, these did not qualify for the dx of apnea and therefore not for CPAP type intervention.  Snoring can be treated by a dental device or chinstrap. Long term struggle with insomnia needs to be addressed with a cognitive behavior / psychological approach. The frequent spontaneous arousals may have been induced by pain or discomfort, but were unrelated to breathing or cardiac function or muscle tone. No tremor detected on video or EMG.  Patient may follow up with her PCP for Insomnia.    We are offering a referral for a dental device if the patient wants to reduce moderate snoring this way.   Neomia Banner, MD          General Information  Name: Dorismar, Basse BMI: 34.74 Physician: Neomia Banner, MD  ID: [id number] Height: 62.0 in  Technician: Octavia Belton, RPSGT  Sex: Female Weight: 156.0 lb Record: [id number]  Age: 44 [04-26-1956] Date: 04/20/2024    Medical & Medication History    Debra Norton is a 68 y.o. female patient who is seen upon referral on 01/07/2024 from NP Kerrie Peek, office of Dr Clearnce Curia. for a new evaluation of fatigue, surgery and radiation last year 2024 for breast cancer. She is still working and she has been declared cancer free. She is on tamoxifen . Had some hot flushes but not affecting . Chief concern according to patient : " I have developed tremors from my waist up, a mild shaking when I go to sleep or wake up from sleep" . These tremors are felt, not seen. She also has some pain in the right leg, only when at rest. No irresistible urge to move, the sensation passes. Sometimes still waking up from her own snoring. Headaches when waking up. No changes in voice, swallowing. I have the pleasure of seeing Debra Norton again on 01/07/24 a 68 year old -handed female with a possible sleep disorder. Last visit 6 years ago : Debra Norton is a 68 y.o. female , seen here as in a referral from Dr. Neomi Banks for a sleep evaluation, The  patient reports that she underwent at least 2 sleep studies, one at the [facility] hospital [facility], another one at Dr. Harriette Limerick private sleep lab on [place]. [facility]. She continues to feel that her sleep has a low quality is not sound and struggles with insomnia. She also reports memory loss respiratory allergies, snoring, daytime fatigue recent surgeries she had a gallbladder surgery just earlier this year. It was right after the procedure under anesthesia that she was again witnessed to trouble with apnea, very low oxygen saturations, and was asked to follow-up here again. the anesthesiologist made it urgent. Calculated pAHI (per hour): 8.5/h ( 3.8/h) REM pAHI: 16.7/h ( 8.4/h) NREM pAHI: 6.2/h ( 2.5/h)  Xanax , Nolvadex    Sleep Disorder      Comments   The patient came into the lab for a PSG. Per patient did not take Xanax . The patient had one restroom break. EKG kept in NSR with some bradycardia. Moderate snoring. All sleep stages witnessed. Respiratory events scored with a 4% desat. Respiratory events in REM. Slept lateral and supine. AHI was 2.3 after 2 hrs of TST. Some spontaneous arousals. The patient never mentioned having the tremor sensations during the night.     Lights out: 10:01:48 PM Lights on: 05:10:38 AM   Time Total Supine Side Prone Upright  Recording (TRT) 7h 9.54m 0h 51.63m 6h 18.3m 0h 0.15m 0h 0.47m  Sleep (TST) 4h 45.44m 0h 20.15m 4h 25.51m 0h 0.41m 0h 0.51m   Latency N1 N2 N3 REM Onset Per. Slp. Eff.  Actual 0h 48.50m 0h 49.58m 1h 27.54m 1h 27.7m 0h 48.16m 1h 0.30m 66.55%   Stg Dur Wake N1 N2 N3 REM  Total 95.0 14.5 191.5 40.5 39.0  Supine 26.5 2.5 18.0 0.0 0.0  Side 68.5 12.0 173.5 40.5 39.0  Prone 0.0 0.0 0.0 0.0 0.0  Upright 0.0 0.0 0.0 0.0 0.0   Stg % Wake N1 N2 N3 REM  Total 25.0 5.1 67.1 14.2 13.7  Supine 7.0 0.9 6.3 0.0 0.0  Side 18.0 4.2 60.8 14.2 13.7  Prone 0.0 0.0 0.0 0.0 0.0  Upright 0.0 0.0 0.0 0.0 0.0     Apnea Summary Sub Supine Side Prone Upright  Total 1 Total 1  0 1 0 0  REM 0 0 0 0 0    NREM 1 0 1 0 0  Obs 1 REM 0 0 0 0 0    NREM 1 0 1 0 0  Mix 0 REM 0 0 0 0 0    NREM 0 0 0 0 0  Cen 0 REM 0 0 0 0 0    NREM 0 0 0 0 0   Rera Summary Sub Supine Side Prone Upright  Total 0 Total 0 0 0 0 0    REM 0 0 0 0 0    NREM 0 0 0 0 0   Hypopnea Summary Sub Supine Side Prone Upright  Total 31 Total 31 2 29  0 0    REM 19 0 19 0 0    NREM 12 2 10  0 0   4% Hypopnea Summary Sub Supine Side Prone Upright  Total (4%) 14 Total 14 0 14 0 0    REM 11 0 11 0 0    NREM 3 0 3 0 0     AHI Total Obs Mix Cen  6.73 Apnea 0.21 0.21 0.00 0.00   Hypopnea 6.51 -- -- --  3.15 Hypopnea (4%) 2.94 -- -- --    Total Supine Side Prone Upright  Position AHI 6.73 5.85 6.79 0.00 0.00  REM AHI 29.23   NREM AHI 3.16   Position RDI 6.73 5.85 6.79 0.00 0.00  REM RDI 29.23   NREM RDI 3.16    4% Hypopnea Total Supine Side Prone Upright  Position AHI (4%) 3.15 0.00 3.40 0.00 0.00  REM AHI (4%) 16.92   NREM AHI (4%) 0.97   Position RDI (4%) 3.15 0.00 3.40 0.00 0.00  REM RDI (4%) 16.92   NREM RDI (4%) 0.97    Desaturation Information Threshold: 2% <100% <90% <80% <70% <60% <50% <40%  Supine 17.0 1.0 0.0 0.0 0.0 0.0 0.0  Side 126.0 2.0 0.0 0.0 0.0 0.0 0.0  Prone 0.0 0.0 0.0 0.0 0.0 0.0 0.0  Upright 0.0 0.0 0.0 0.0 0.0 0.0 0.0  Total 143.0 3.0 0.0 0.0 0.0 0.0 0.0  Index 22.5 0.5 0.0 0.0 0.0 0.0 0.0   Threshold: 3% <100% <90% <80% <70% <60% <50% <40%  Supine 7.0 1.0 0.0 0.0 0.0 0.0 0.0  Side 45.0 2.0 0.0 0.0 0.0 0.0 0.0  Prone 0.0 0.0 0.0 0.0 0.0 0.0 0.0  Upright 0.0 0.0 0.0 0.0 0.0 0.0 0.0  Total 52.0 3.0 0.0 0.0 0.0 0.0 0.0  Index 8.2 0.5 0.0 0.0 0.0 0.0 0.0   Threshold: 4% <100% <90% <80% <70% <60% <50% <40%  Supine 3.0 1.0 0.0 0.0 0.0 0.0 0.0  Side 25.0 2.0 0.0 0.0 0.0 0.0 0.0  Prone 0.0 0.0 0.0 0.0 0.0 0.0 0.0  Upright 0.0 0.0 0.0 0.0 0.0 0.0 0.0  Total 28.0 3.0 0.0 0.0 0.0 0.0 0.0  Index 4.4 0.5 0.0 0.0 0.0 0.0 0.0   Threshold: 4% <100% <90% <80% <70%  <60% <50% <40%  Supine 3 1 0 0 0 0 0  Side 25 2 0 0 0 0 0  Prone 0 0 0 0 0 0 0  Upright 0 0 0 0 0 0 0  Total 28 3 0 0 0 0 0   Awakening/Arousal Information # of Awakenings 27  Wake after sleep onset 95.23m  Wake after persistent sleep 88.87m   Arousal Assoc. Arousals Index  Apneas 1 0.2  Hypopneas 7 1.5  Leg Movements 0 0.0  Snore  0 0.0  PTT Arousals 0 0.0  Spontaneous 71 14.9  Total 79 16.6  Leg Movement Information PLMS LMs Index  Total LMs during PLMS 0 0.0  LMs w/ Microarousals 0 0.0   LM LMs Index  w/ Microarousal 0 0.0  w/ Awakening 0 0.0  w/ Resp Event 0 0.0  Spontaneous 0 0.0  Total 0 0.0     Desaturation threshold setting: 4% Minimum desaturation setting: 10 seconds SaO2 nadir: 73% The longest event was a 76 sec obstructive Hypopnea with a minimum SaO2 of 93%. The lowest SaO2 was 90% associated with a 39 sec obstructive Hypopnea. EKG Rates EKG Avg Max Min  Awake 71 88 59  Asleep 66 86 58  EKG Events: N/A

## 2024-04-30 NOTE — Telephone Encounter (Signed)
 Called the patient to review the sleep study results. Advised there was nothing that showed concern for sleep apnea or an organic sleep disorder. Advised that there was sleep fragmentation noted but was not due to sleep apnea arousals.  Pt verbalized understanding.  Pt is interested in cognitive behavior therapy advised we can place this referral. Informed the patient that I will place the referral. Pt is still concerned that the reason she came in is because she "developed tremors from waist up, a mild shaking when I go to sleep or wake up from sleep"   She states that when it first started it wasn't happening often and would last only a couple seconds. States that its now happening more frequently and lasting a little longer 5-10 sec. She is concerned because it is happening more frequently. Informed her the EEG was normal on the sleep study. Advised I would pass the information along and see if Dr Albertina Hugger was planning on doing anything else. Pt verbalized understanding.

## 2024-04-30 NOTE — Telephone Encounter (Signed)
-----   Message from Slick Dohmeier sent at 04/27/2024  1:58 PM EDT ----- No apnea present - AHI was below  5/h, moderate snoring was seen and insomnia.  Snoring can be treated by a dental device or chinstrap. Long term struggle with insomnia needs to be addressed with a cognitive behavior / psychological approach. The frequent spontaneous arousals may have been induced by pain or discomfort, but were unrelated to breathing or cardiac function or muscle tone. No tremor detected on video or EMG.  Patient may follow up with her PCP for Insomnia.    We are offering a referral for a dental device if the patient wants to reduce moderate snoring this way.

## 2024-05-03 ENCOUNTER — Telehealth: Payer: Self-pay | Admitting: Neurology

## 2024-05-03 NOTE — Telephone Encounter (Signed)
Referral for psychiatry fax to [facility]. Phone: [phone], Fax: [phone]

## 2024-05-10 ENCOUNTER — Encounter: Payer: Self-pay | Admitting: Adult Health

## 2024-05-12 NOTE — Telephone Encounter (Signed)
 Patient is scheduled to see Arland Labrador PA-C at [facility] for medication management 7/9 at 1:00 pm

## 2024-05-13 ENCOUNTER — Inpatient Hospital Stay: Attending: Adult Health | Admitting: Adult Health

## 2024-05-13 ENCOUNTER — Encounter: Payer: Self-pay | Admitting: Adult Health

## 2024-05-13 VITALS — BP 130/65 | HR 72 | Temp 98.4°F | Resp 16 | Wt 156.8 lb

## 2024-05-13 DIAGNOSIS — D0511 Intraductal carcinoma in situ of right breast: Secondary | ICD-10-CM

## 2024-05-13 DIAGNOSIS — Z923 Personal history of irradiation: Secondary | ICD-10-CM | POA: Diagnosis not present

## 2024-05-13 DIAGNOSIS — Z79810 Long term (current) use of selective estrogen receptor modulators (SERMs): Secondary | ICD-10-CM | POA: Diagnosis not present

## 2024-05-13 NOTE — Progress Notes (Unsigned)
 [facility] Cancer Follow up:    Debra Hock, NP [address] [place] [address]   DIAGNOSIS: Cancer Staging  Ductal carcinoma in situ (DCIS) of right breast Staging form: Breast, AJCC 8th Edition - Clinical stage from 01/15/2023: Stage 0 (cTis (DCIS), cN0, cM0, ER+, PR-, HER2: Not Assessed) - Signed by Cameron Cea, MD on 01/15/2023 Stage prefix: Initial diagnosis Nuclear grade: G3 Laterality: Right Staged by: Pathologist and managing physician Stage used in treatment planning: Yes National guidelines used in treatment planning: Yes Type of national guideline used in treatment planning: NCCN    SUMMARY OF ONCOLOGIC HISTORY: Oncology History  Ductal carcinoma in situ (DCIS) of right breast  01/13/2023 Initial Diagnosis   Screening mammogram detected right breast mass 1.6 x 0.5 cm at 3 o'clock position biopsy revealed high-grade DCIS solid and cribriform features ER 40%, PR 0%   01/15/2023 Cancer Staging   Staging form: Breast, AJCC 8th Edition - Clinical stage from 01/15/2023: Stage 0 (cTis (DCIS), cN0, cM0, ER+, PR-, HER2: Not Assessed) - Signed by Cameron Cea, MD on 01/15/2023 Stage prefix: Initial diagnosis Nuclear grade: G3 Laterality: Right Staged by: Pathologist and managing physician Stage used in treatment planning: Yes National guidelines used in treatment planning: Yes Type of national guideline used in treatment planning: NCCN    Genetic Testing   [facility] Common Cancer Panel+RNA was Negative. Report date is 01/25/2023.  The Common Hereditary Cancers Panel offered by [facility] includes sequencing and/or deletion duplication testing of the following 48 genes: APC, ATM, AXIN2, BAP1, BARD1, BMPR1A, BRCA1, BRCA2, BRIP1, CDH1, CDK4, CDKN2A (p14ARF and p16INK4a only), CHEK2, CTNNA1, DICER1, EPCAM (Deletion/duplication testing only), FH, GREM1 (promoter region duplication testing only), HOXB13, KIT, MBD4, MEN1, MLH1, MSH2, MSH3, MSH6,  MUTYH, NF1, NHTL1, PALB2, PDGFRA, PMS2, POLD1, POLE, PTEN, RAD51C, RAD51D, SDHA (sequencing analysis only except exon 14), SDHB, SDHC, SDHD, SMAD4, SMARCA4. STK11, TP53, TSC1, TSC2, and VHL.   01/30/2023 Surgery   Right lumpectomy: High-grade DCIS with necrosis 1 cm, no evidence of invasive cancer, margins negative, ER 40%, PR 0%   03/13/2023 - 04/09/2023 Radiation Therapy   Plan Name: Breast_R Site: Breast, Right Technique: 3D Mode: Photon Dose Per Fraction: 2.66 Gy Prescribed Dose (Delivered / Prescribed): 42.56 Gy / 42.56 Gy Prescribed Fxs (Delivered / Prescribed): 16 / 16   Plan Name: Breast_R_Bst Site: Breast, Right Technique: 3D Mode: Photon Dose Per Fraction: 2 Gy Prescribed Dose (Delivered / Prescribed): 8 Gy / 8 Gy Prescribed Fxs (Delivered / Prescribed): 4 / 4   03/2023 -  Anti-estrogen oral therapy   Tamoxifen      CURRENT THERAPY: tamoxifen   INTERVAL HISTORY:  Discussed the use of AI scribe software for clinical note transcription with the patient, who gave verbal consent to proceed.  History of Present Illness      Patient Active Problem List   Diagnosis Date Noted   Persistent disorder of initiating or maintaining sleep 04/27/2024   Snoring 01/07/2024   Tremor, unspecified 01/07/2024   Genetic testing 01/27/2023   Family history of colon cancer in mother 01/16/2023   Breast cancer of lower-inner quadrant of right female breast (HCC) 01/14/2023   Ductal carcinoma in situ (DCIS) of right breast 01/13/2023   Absolute anemia 02/03/2018   Fatigue associated with anemia 02/03/2018   Postoperative hypoxemia 10/20/2017   Hypercapnia 10/20/2017   At risk for obstructive sleep apnea 10/20/2017   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  MEDICAL HISTORY: Past Medical History:  Diagnosis Date   Allergy    Anxiety    Arthritis    Cancer (HCC)    Cervical dysplasia    Cholelithiasis    14mm stone seen on ultrasound in 2009   Colon polyp    Complication of anesthesia    pain was not controlled in PACU- had to be admitted   Depression    Diverticulitis    Migraine headache    years ago   Sleep apnea    Vertigo     SURGICAL HISTORY: Past Surgical History:  Procedure Laterality Date   BREAST LUMPECTOMY WITH RADIOACTIVE SEED LOCALIZATION Right 01/30/2023   Procedure: RIGHT BREAST LUMPECTOMY WITH RADIOACTIVE SEED LOCALIZATION;  Surgeon: Lockie Rima, MD;  Location: [facility] OR;  Service: General;  Laterality: Right;   BREAST SURGERY     CHOLECYSTECTOMY     COLPOSCOPY     ECTOPIC PREGNANCY SURGERY     EXTERNAL EAR SURGERY Left    EYE SURGERY     GYNECOLOGIC CRYOSURGERY      SOCIAL HISTORY: Social History   Socioeconomic History   Marital status: Widowed    Spouse name: Not on file   Number of children: 0   Years of education: Not on file   Highest education level: Associate degree: occupational, Scientist, product/process development, or vocational program  Occupational History   Occupation: [employer], 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  Tobacco Use   Smoking status: Former    Current packs/day: 0.00    Types: Cigarettes    Start date: 63    Quit date: 05/16/1977    Years since quitting: 47.0   Smokeless tobacco: Never   Tobacco comments:    quit in high School  Vaping Use   Vaping status: Never Used  Substance and Sexual Activity   Alcohol use: Yes    Alcohol/week: 2.0 standard drinks of alcohol    Types: 2 Standard drinks or equivalent per week   Drug use: Not Currently   Sexual activity: Yes    Birth control/protection: None    Comment: INTERCOURSE AGE 20, SEXUAL PARTNERS   MORE THAN 5  Other Topics Concern   Not on file  Social History Narrative   Lives with boy friend   G4 P0    miscarriage x 3    ectopic x 1    Social Drivers of Corporate investment banker Strain: Low Risk  (01/22/2024)   Overall Financial Resource Strain (CARDIA)    Difficulty of Paying Living Expenses: Not hard at all  Food Insecurity: No Food Insecurity (01/22/2024)   Hunger Vital Sign    Worried About Running Out of Food in the Last Year: Never true    Ran Out of Food in the Last Year: Never true  Transportation Needs: No Transportation Needs (01/22/2024)   PRAPARE - Administrator, Civil Service (Medical): No    Lack of Transportation (Non-Medical): No  Physical Activity: Insufficiently Active (01/22/2024)   Exercise Vital Sign    Days of Exercise per Week: 2 days    Minutes of Exercise per Session: 20 min  Stress: No Stress Concern Present (01/22/2024)  [employer] of Occupational Health - Occupational Stress Questionnaire    Feeling of Stress : Not at all  Social Connections: Moderately Isolated (01/22/2024)   Social Connection and Isolation Panel [NHANES]    Frequency of Communication with Friends and Family: More than three times a week    Frequency of Social Gatherings with Friends and Family: Three times a week    Attends Religious Services: Never    Active Member of Clubs or Organizations: No    Attends Banker Meetings: 1 to 4 times per year    Marital Status: Widowed  Intimate Partner Violence: Not At Risk (11/11/2023)   Humiliation, Afraid, Rape, and Kick questionnaire    Fear of Current or Ex-Partner: No    Emotionally Abused: No    Physically Abused: No    Sexually Abused: No    FAMILY HISTORY: Family History  Problem Relation Age of Onset   Colon cancer Mother 20 - 74   Anxiety disorder Mother    Asthma Mother    Cancer Mother    CAD Father        29   Diabetes Sister    Early death Sister    Ovarian cancer Cousin         maternal first cousin   Breast cancer Neg Hx    Esophageal cancer Neg Hx    Pancreatic cancer Neg Hx    Rectal cancer Neg Hx    Stomach cancer Neg Hx     Review of Systems  Constitutional:  Negative for appetite change, chills, fatigue, fever and unexpected weight change.  HENT:   Negative for hearing loss, lump/mass and trouble swallowing.   Eyes:  Negative for eye problems and icterus.  Respiratory:  Negative for chest tightness, cough and shortness of breath.   Cardiovascular:  Negative for chest pain, leg swelling and palpitations.  Gastrointestinal:  Negative for abdominal distention, abdominal pain, constipation, diarrhea, nausea and vomiting.  Endocrine: Negative for hot flashes.  Genitourinary:  Negative for difficulty urinating.   Musculoskeletal:  Negative for arthralgias.  Skin:  Negative for itching and rash.  Neurological:  Negative for dizziness, extremity weakness, headaches and numbness.  Hematological:  Negative for adenopathy. Does not bruise/bleed easily.  Psychiatric/Behavioral:  Negative for depression. The patient is not nervous/anxious.       PHYSICAL EXAMINATION    Vitals:   05/13/24 1044  BP: 130/65  Pulse: 72  Resp: 16  Temp: 98.4 F (36.9 C)  SpO2: 99%    Physical Exam Constitutional:      General: She is not in acute distress.    Appearance: Normal appearance. She is not toxic-appearing.  HENT:     Head: Normocephalic and atraumatic.     Mouth/Throat:     Mouth: Mucous membranes are moist.     Pharynx: Oropharynx is clear. No oropharyngeal exudate or posterior oropharyngeal erythema.  Eyes:     General: No scleral icterus. Cardiovascular:     Rate and Rhythm: Normal rate and regular rhythm.     Pulses: Normal pulses.     Heart sounds: Normal heart sounds.  Pulmonary:     Effort: Pulmonary effort is normal.     Breath sounds: Normal breath sounds.  Abdominal:     General: Abdomen is flat. Bowel sounds are normal. There is no  distension.     Palpations: Abdomen is soft.     Tenderness: There is no abdominal tenderness.  Musculoskeletal:  General: No swelling.     Cervical back: Neck supple.  Lymphadenopathy:     Cervical: No cervical adenopathy.  Skin:    General: Skin is warm and dry.     Findings: No rash.  Neurological:     General: No focal deficit present.     Mental Status: She is alert.  Psychiatric:        Mood and Affect: Mood normal.        Behavior: Behavior normal.     LABORATORY DATA:  CBC    Component Value Date/Time   WBC 5.5 10/31/2023 1239   RBC 4.32 10/31/2023 1239   HGB 13.1 10/31/2023 1239   HGB 13.8 01/15/2023 1201   HCT 39.8 10/31/2023 1239   PLT 268.0 10/31/2023 1239   PLT 306 01/15/2023 1201   MCV 92.1 10/31/2023 1239   MCH 30.5 01/24/2023 1550   MCHC 33.1 10/31/2023 1239   RDW 13.5 10/31/2023 1239   LYMPHSABS 1.6 10/31/2023 1239   MONOABS 0.4 10/31/2023 1239   EOSABS 0.1 10/31/2023 1239   BASOSABS 0.0 10/31/2023 1239    CMP     Component Value Date/Time   NA 144 01/23/2024 1115   K 4.7 01/23/2024 1115   CL 106 01/23/2024 1115   CO2 29 01/23/2024 1115   GLUCOSE 94 01/23/2024 1115   BUN 13 01/23/2024 1115   CREATININE 0.83 01/23/2024 1115   CREATININE 0.88 01/15/2023 1201   CREATININE 0.82 02/27/2022 0955   CALCIUM  9.3 01/23/2024 1115   PROT 6.4 10/31/2023 1239   ALBUMIN 4.0 10/31/2023 1239   AST 21 10/31/2023 1239   AST 18 01/15/2023 1201   ALT 14 10/31/2023 1239   ALT 12 01/15/2023 1201   ALKPHOS 52 10/31/2023 1239   BILITOT 0.4 10/31/2023 1239   BILITOT 0.5 01/15/2023 1201   GFRNONAA >60 01/24/2023 1550   GFRNONAA >60 01/15/2023 1201   GFRAA >60 09/20/2018 1017     ASSESSMENT and THERAPY PLAN:   No problem-specific Assessment & Plan notes found for this encounter.     All questions were answered. The patient knows to call the clinic with any problems, questions or concerns. We can certainly see the patient much sooner if  necessary.  Total encounter time:*** minutes*in face-to-face visit time, chart review, lab review, care coordination, order entry, and documentation of the encounter time.    Alwin Baars, NP 05/13/24 11:00 AM Medical Oncology and Hematology [facility] [address] [place], [place] [address] Tel. [phone]    Fax. [phone]  *Total Encounter Time as defined by the Centers for Medicare and Medicaid Services includes, in addition to the face-to-face time of a patient visit (documented in the note above) non-face-to-face time: obtaining and reviewing outside history, ordering and reviewing medications, tests or procedures, care coordination (communications with other health care professionals or caregivers) and documentation in the medical record.

## 2024-05-14 NOTE — Assessment & Plan Note (Signed)
 01/13/2023:Screening mammogram detected right breast mass 1.6 x 0.5 cm at 3 o'clock position biopsy revealed high-grade DCIS solid and cribriform features ER 40%, PR 0% 01/30/2023:Right lumpectomy: High-grade DCIS with necrosis 1 cm, no evidence of invasive cancer, margins negative, ER 40%, PR 0% 03/14/2023-04/09/2023: Adjuvant radiation   Treatment plan: Adjuvant tamoxifen  10 mg daily x 5 years (lower dose being used based on TAM-01 clinical trial showing lower doses of tamoxifen  even as low as 5 mg were beneficial)   Assessment and Plan Assessment & Plan Breast lump and pain New right breast lump with pain. Differential includes cyst, fat necrosis, or benign conditions. Imaging to evaluate further. - Order mammogram and ultrasound for the right breast. - Send orders to [facility] for scheduling.  Right breast cancer New right breast lump and pain on the same side as previous cancer. Imaging needed to rule out recurrence. - Continue tamoxifen . - Order mammogram and ultrasound for the right breast. - Send orders to [facility]

## 2024-05-17 DIAGNOSIS — N6312 Unspecified lump in the right breast, upper inner quadrant: Secondary | ICD-10-CM | POA: Diagnosis not present

## 2024-05-17 DIAGNOSIS — N6001 Solitary cyst of right breast: Secondary | ICD-10-CM | POA: Diagnosis not present

## 2024-05-18 ENCOUNTER — Encounter: Payer: Self-pay | Admitting: Internal Medicine

## 2024-05-26 ENCOUNTER — Other Ambulatory Visit: Payer: Self-pay | Admitting: Hematology and Oncology

## 2024-06-06 NOTE — Assessment & Plan Note (Signed)
 01/13/2023:Screening mammogram detected right breast mass 1.6 x 0.5 cm at 3 o'clock position biopsy revealed high-grade DCIS solid and cribriform features ER 40%, PR 0% 01/30/2023:Right lumpectomy: High-grade DCIS with necrosis 1 cm, no evidence of invasive cancer, margins negative, ER 40%, PR 0% 03/14/2023-04/09/2023: Adjuvant radiation   Treatment plan: Adjuvant tamoxifen  10 mg daily x 5 years (lower dose being used based on TAM-01 clinical trial showing lower doses of tamoxifen  even as low as 5 mg were beneficial)   Assessment and Plan Assessment & Plan Breast lump and pain: Benign Right breast Oval cyst (Mamm and US  05/18/24)   - Continue tamoxifen .

## 2024-06-07 ENCOUNTER — Inpatient Hospital Stay: Payer: Medicare HMO | Attending: Adult Health | Admitting: Hematology and Oncology

## 2024-06-07 VITALS — BP 127/68 | HR 81 | Temp 98.4°F | Resp 17 | Wt 157.1 lb

## 2024-06-07 DIAGNOSIS — Z79810 Long term (current) use of selective estrogen receptor modulators (SERMs): Secondary | ICD-10-CM | POA: Insufficient documentation

## 2024-06-07 DIAGNOSIS — D0511 Intraductal carcinoma in situ of right breast: Secondary | ICD-10-CM | POA: Diagnosis not present

## 2024-06-07 DIAGNOSIS — Z923 Personal history of irradiation: Secondary | ICD-10-CM | POA: Diagnosis not present

## 2024-06-07 NOTE — Progress Notes (Signed)
 Patient Care Team: Almarie Waddell NOVAK, NP as PCP - General (Family Medicine) Ebbie Cough, MD as Consulting Physician (General Surgery) Aron Shoulders, MD as Consulting Physician (General Surgery) Odean Potts, MD as Consulting Physician (Hematology and Oncology) Dewey Rush, MD as Consulting Physician (Radiation Oncology) Fleeta Zerita DASEN, MD as Consulting Physician (Ophthalmology)  DIAGNOSIS:  Encounter Diagnosis  Name Primary?   Ductal carcinoma in situ (DCIS) of right breast Yes    SUMMARY OF ONCOLOGIC HISTORY: Oncology History  Ductal carcinoma in situ (DCIS) of right breast  01/13/2023 Initial Diagnosis   Screening mammogram detected right breast mass 1.6 x 0.5 cm at 3 o'clock position biopsy revealed high-grade DCIS solid and cribriform features ER 40%, PR 0%   01/15/2023 Cancer Staging   Staging form: Breast, AJCC 8th Edition - Clinical stage from 01/15/2023: Stage 0 (cTis (DCIS), cN0, cM0, ER+, PR-, HER2: Not Assessed) - Signed by Odean Potts, MD on 01/15/2023 Stage prefix: Initial diagnosis Nuclear grade: G3 Laterality: Right Staged by: Pathologist and managing physician Stage used in treatment planning: Yes National guidelines used in treatment planning: Yes Type of national guideline used in treatment planning: NCCN    Genetic Testing   [facility] Common Cancer Panel+RNA was Negative. Report date is 01/25/2023.  The Common Hereditary Cancers Panel offered by [facility] includes sequencing and/or deletion duplication testing of the following 48 genes: APC, ATM, AXIN2, BAP1, BARD1, BMPR1A, BRCA1, BRCA2, BRIP1, CDH1, CDK4, CDKN2A (p14ARF and p16INK4a only), CHEK2, CTNNA1, DICER1, EPCAM (Deletion/duplication testing only), FH, GREM1 (promoter region duplication testing only), HOXB13, KIT, MBD4, MEN1, MLH1, MSH2, MSH3, MSH6, MUTYH, NF1, NHTL1, PALB2, PDGFRA, PMS2, POLD1, POLE, PTEN, RAD51C, RAD51D, SDHA (sequencing analysis only except exon 14), SDHB, SDHC, SDHD, SMAD4, SMARCA4.  STK11, TP53, TSC1, TSC2, and VHL.   01/30/2023 Surgery   Right lumpectomy: High-grade DCIS with necrosis 1 cm, no evidence of invasive cancer, margins negative, ER 40%, PR 0%   03/13/2023 - 04/09/2023 Radiation Therapy   Plan Name: Breast_R Site: Breast, Right Technique: 3D Mode: Photon Dose Per Fraction: 2.66 Gy Prescribed Dose (Delivered / Prescribed): 42.56 Gy / 42.56 Gy Prescribed Fxs (Delivered / Prescribed): 16 / 16   Plan Name: Breast_R_Bst Site: Breast, Right Technique: 3D Mode: Photon Dose Per Fraction: 2 Gy Prescribed Dose (Delivered / Prescribed): 8 Gy / 8 Gy Prescribed Fxs (Delivered / Prescribed): 4 / 4   03/2023 -  Anti-estrogen oral therapy   Tamoxifen      CHIEF COMPLIANT: Follow-up on tamoxifen  therapy  HISTORY OF PRESENT ILLNESS:   History of Present Illness Debra Norton is a 68 year old female with breast cancer who presents for follow-up regarding tamoxifen  use and new onset tremors.  She takes tamoxifen  5 mg daily, experiencing mild hot flashes but no leg cramps. She has been on this regimen for about one year after completing radiation therapy in April 2024.  An ultrasound for breast discomfort revealed a cyst on the right side, where she had radiation. She experiences tenderness and occasional jabs of pain in this area.  She experiences mild tremors, described as 'shivering' without feeling cold, primarily in bed. These episodes last from a few seconds to a minute and occur once or twice a week. A sleep study showed no abnormalities, and no tremors were observed during the study.     ALLERGIES:  is allergic to diphenhydramine hcl, penicillins, clindamycin/lincomycin, flagyl  [metronidazole ], hydrocodone , and tetanus toxoid.  MEDICATIONS:  Current Outpatient Medications  Medication Sig Dispense Refill   ALPRAZolam  (XANAX ) 0.25  MG tablet Take 1 tablet (0.25 mg total) by mouth at bedtime as needed for anxiety. 30 tablet 0   tamoxifen  (NOLVADEX )  10 MG tablet TAKE 0.5 TABLET BY MOUTH DAILY 90 tablet 1   Current Facility-Administered Medications  Medication Dose Route Frequency Provider Last Rate Last Admin   0.9 %  sodium chloride  infusion  500 mL Intravenous Once Armbruster, Elspeth SQUIBB, MD        PHYSICAL EXAMINATION: ECOG PERFORMANCE STATUS: 1 - Symptomatic but completely ambulatory  Vitals:   06/07/24 1106  BP: 127/68  Pulse: 81  Resp: 17  Temp: 98.4 F (36.9 C)  SpO2: 99%   Filed Weights   06/07/24 1106  Weight: 157 lb 1.6 oz (71.3 kg)    Physical Exam   (exam performed in the presence of a chaperone)  LABORATORY DATA:  I have reviewed the data as listed    Latest Ref Rng & Units 01/23/2024   11:15 AM 10/31/2023   12:39 PM 01/24/2023    3:50 PM  CMP  Glucose 70 - 99 mg/dL 94  95  896   BUN 6 - 23 mg/dL 13  13  14    Creatinine 0.40 - 1.20 mg/dL 9.16  9.13  9.16   Sodium 135 - 145 mEq/L 144  141  139   Potassium 3.5 - 5.1 mEq/L 4.7  4.2  3.8   Chloride 96 - 112 mEq/L 106  107  103   CO2 19 - 32 mEq/L 29  27  24    Calcium  8.4 - 10.5 mg/dL 9.3  9.1  9.2   Total Protein 6.0 - 8.3 g/dL  6.4    Total Bilirubin 0.2 - 1.2 mg/dL  0.4    Alkaline Phos 39 - 117 U/L  52    AST 0 - 37 U/L  21    ALT 0 - 35 U/L  14      Lab Results  Component Value Date   WBC 5.5 10/31/2023   HGB 13.1 10/31/2023   HCT 39.8 10/31/2023   MCV 92.1 10/31/2023   PLT 268.0 10/31/2023   NEUTROABS 3.3 10/31/2023    ASSESSMENT & PLAN:  Ductal carcinoma in situ (DCIS) of right breast 01/13/2023:Screening mammogram detected right breast mass 1.6 x 0.5 cm at 3 o'clock position biopsy revealed high-grade DCIS solid and cribriform features ER 40%, PR 0% 01/30/2023:Right lumpectomy: High-grade DCIS with necrosis 1 cm, no evidence of invasive cancer, margins negative, ER 40%, PR 0% 03/14/2023-04/09/2023: Adjuvant radiation   Treatment plan: Adjuvant tamoxifen  5 mg daily x 5 years (lower dose being used based on TAM-01 clinical trial showing  lower doses of tamoxifen  even as low as 5 mg were beneficial)   Breast lump and pain: Benign Right breast Oval cyst (Mamm and US  05/18/24)   Tamoxifen  toxicities: Once or twice a week cold flashes Return to clinic in 1 year for follow-up  No orders of the defined types were placed in this encounter.  The patient has a good understanding of the overall plan. she agrees with it. she will call with any problems that may develop before the next visit here. Total time spent: 30 mins including face to face time and time spent for planning, charting and co-ordination of care   Viinay K Jarrette Dehner, MD 06/07/24

## 2024-06-23 DIAGNOSIS — F419 Anxiety disorder, unspecified: Secondary | ICD-10-CM | POA: Diagnosis not present

## 2024-07-07 DIAGNOSIS — H25813 Combined forms of age-related cataract, bilateral: Secondary | ICD-10-CM | POA: Diagnosis not present

## 2024-07-07 DIAGNOSIS — H35372 Puckering of macula, left eye: Secondary | ICD-10-CM | POA: Diagnosis not present

## 2024-07-14 ENCOUNTER — Encounter: Payer: Self-pay | Admitting: Internal Medicine

## 2024-07-15 ENCOUNTER — Encounter: Payer: Self-pay | Admitting: Family Medicine

## 2024-07-15 NOTE — Telephone Encounter (Signed)
 Last seen by Waddell in February. New appt? Also copying to DOD for advise.

## 2024-07-21 ENCOUNTER — Other Ambulatory Visit: Payer: Self-pay | Admitting: Medical Genetics

## 2024-07-28 ENCOUNTER — Encounter: Payer: Self-pay | Admitting: Family Medicine

## 2024-07-28 ENCOUNTER — Ambulatory Visit: Payer: Self-pay | Admitting: Family Medicine

## 2024-07-28 ENCOUNTER — Ambulatory Visit: Admitting: Family Medicine

## 2024-07-28 ENCOUNTER — Ambulatory Visit (HOSPITAL_BASED_OUTPATIENT_CLINIC_OR_DEPARTMENT_OTHER)
Admission: RE | Admit: 2024-07-28 | Discharge: 2024-07-28 | Disposition: A | Discharge status: Home / Self Care | Source: Ambulatory Visit | Attending: Family Medicine | Admitting: Family Medicine

## 2024-07-28 ENCOUNTER — Ambulatory Visit: Attending: Family Medicine

## 2024-07-28 VITALS — BP 131/65 | HR 79 | Ht 62.0 in | Wt 156.0 lb

## 2024-07-28 DIAGNOSIS — E785 Hyperlipidemia, unspecified: Secondary | ICD-10-CM | POA: Diagnosis not present

## 2024-07-28 DIAGNOSIS — R5383 Other fatigue: Secondary | ICD-10-CM

## 2024-07-28 DIAGNOSIS — Z87898 Personal history of other specified conditions: Secondary | ICD-10-CM

## 2024-07-28 DIAGNOSIS — R42 Dizziness and giddiness: Secondary | ICD-10-CM

## 2024-07-28 DIAGNOSIS — R0781 Pleurodynia: Secondary | ICD-10-CM

## 2024-07-28 DIAGNOSIS — W19XXXA Unspecified fall, initial encounter: Secondary | ICD-10-CM | POA: Diagnosis not present

## 2024-07-28 LAB — CBC WITH DIFFERENTIAL/PLATELET
Basophils Absolute: 0 K/uL (ref 0.0–0.1)
Basophils Relative: 0.5 % (ref 0.0–3.0)
Eosinophils Absolute: 0.1 K/uL (ref 0.0–0.7)
Eosinophils Relative: 2.5 % (ref 0.0–5.0)
HCT: 40.3 % (ref 36.0–46.0)
Hemoglobin: 13.1 g/dL (ref 12.0–15.0)
Lymphocytes Relative: 35.9 % (ref 12.0–46.0)
Lymphs Abs: 1.7 K/uL (ref 0.7–4.0)
MCHC: 32.6 g/dL (ref 30.0–36.0)
MCV: 91.9 fl (ref 78.0–100.0)
Monocytes Absolute: 0.4 K/uL (ref 0.1–1.0)
Monocytes Relative: 8.5 % (ref 3.0–12.0)
Neutro Abs: 2.5 K/uL (ref 1.4–7.7)
Neutrophils Relative %: 52.6 % (ref 43.0–77.0)
Platelets: 233 K/uL (ref 150.0–400.0)
RBC: 4.38 Mil/uL (ref 3.87–5.11)
RDW: 13.6 % (ref 11.5–15.5)
WBC: 4.8 K/uL (ref 4.0–10.5)

## 2024-07-28 LAB — LIPID PANEL
Cholesterol: 224 mg/dL — ABNORMAL HIGH (ref 0–200)
HDL: 61.3 mg/dL (ref >39.00)
LDL Cholesterol: 138 mg/dL — ABNORMAL HIGH (ref 0–99)
NonHDL: 162.42
Total CHOL/HDL Ratio: 4
Triglycerides: 120 mg/dL (ref 0.0–149.0)
VLDL: 24 mg/dL (ref 0.0–40.0)

## 2024-07-28 LAB — COMPREHENSIVE METABOLIC PANEL WITH GFR
ALT: 18 U/L (ref 0–35)
AST: 23 U/L (ref 0–37)
Albumin: 4 g/dL (ref 3.5–5.2)
Alkaline Phosphatase: 48 U/L (ref 39–117)
BUN: 16 mg/dL (ref 6–23)
CO2: 29 meq/L (ref 19–32)
Calcium: 9.1 mg/dL (ref 8.4–10.5)
Chloride: 106 meq/L (ref 96–112)
Creatinine, Ser: 0.81 mg/dL (ref 0.40–1.20)
GFR: 75.43 mL/min (ref >60.00)
Glucose, Bld: 86 mg/dL (ref 70–99)
Potassium: 4.4 meq/L (ref 3.5–5.1)
Sodium: 141 meq/L (ref 135–145)
Total Bilirubin: 0.4 mg/dL (ref 0.2–1.2)
Total Protein: 6.8 g/dL (ref 6.0–8.3)

## 2024-07-28 LAB — IBC + FERRITIN
Ferritin: 87.8 ng/mL (ref 10.0–291.0)
Iron: 114 ug/dL (ref 42–145)
Saturation Ratios: 35.6 % (ref 20.0–50.0)
TIBC: 320.6 ug/dL (ref 250.0–450.0)
Transferrin: 229 mg/dL (ref 212.0–360.0)

## 2024-07-28 LAB — B12 AND FOLATE PANEL
Folate: 14.5 ng/mL (ref >5.9)
Vitamin B-12: 480 pg/mL (ref 211–911)

## 2024-07-28 LAB — TSH: TSH: 4.31 u[IU]/mL (ref 0.35–5.50)

## 2024-07-28 NOTE — Progress Notes (Signed)
 Acute Office Visit  Subjective:     Patient ID: Debra Norton, female    DOB: 27-May-1956, 68 y.o.   MRN: [id number]  Chief Complaint  Patient presents with   Dizziness     Patient is in today for recent fall, syncopal episode.    Discussed the use of AI scribe software for clinical note transcription with the patient, who gave verbal consent to proceed.  History of Present Illness Debra Norton is a 68 year old female who presents with recurrent episodes of syncope and dizziness.  She experienced a syncope episode two weeks ago while on a boat, feeling clammy, hot, and nauseous before losing consciousness and falling, resulting in bruises and suspected cracked ribs on the left side. She had eaten and drunk normally that day, but had taken Dramamine, which was the only difference from her usual routine.  Similar episodes occur approximately every six months, with two instances of true syncope.  Additionally, dizziness and nausea can occur within 30 minutes after eating, lasting for about 30 minutes to an hour, without vomiting. She denies chest pain and shortness of breath during these episodes, but has noted an elevated heart rate (palpitations) during at least one episode.  During these episodes, she has checked her heart rate, noting a rate of 138 bpm during one episode, which gradually decreased to 110 bpm as she felt better. She does not have a blood pressure cuff at home. She has not checked her blood sugars.  She has a history of anemia, which was iron-related, and feels somewhat tired all the time. She denies any recent changes in diet or significant dehydration during the recent episode.  She experiences frequent burping and occasional flushing when eating, but denies heartburn or reflux.  She has a history of seeing a neurologist for tremors and underwent a normal sleep study. She denies any seizure-like activity or neurological symptoms.      All  review of systems negative except what is listed in the HPI      Objective:    BP 131/65   Pulse 79   Ht 5' 2 (1.575 m)   Wt 156 lb (70.8 kg)   SpO2 98%   BMI 28.53 kg/m    Orthostatic VS for the past 24 hrs:  BP- Lying Pulse- Lying BP- Sitting Pulse- Sitting BP- Standing at 0 minutes Pulse- Standing at 0 minutes  07/28/24 1015 108/79 72 131/65 79 103/84 92      Physical Exam Vitals reviewed.  Constitutional:      Appearance: Normal appearance.  Cardiovascular:     Rate and Rhythm: Normal rate and regular rhythm.  Pulmonary:     Effort: Pulmonary effort is normal.     Breath sounds: Normal breath sounds.  Neurological:     Mental Status: She is alert and oriented to person, place, and time.  Psychiatric:        Mood and Affect: Mood normal.        Behavior: Behavior normal.        Thought Content: Thought content normal.        Judgment: Judgment normal.        No results found for any visits on 07/28/24.      Assessment & Plan:   Problem List Items Addressed This Visit   None Visit Diagnoses       Fall, initial encounter    -  Primary   Relevant Orders   DG Ribs Unilateral Left (  Completed)     History of syncope       Relevant Orders   Ambulatory referral to Cardiology   LONG TERM MONITOR (3-14 DAYS)   CBC with Differential/Platelet   Comprehensive metabolic panel with GFR   TSH   ECHOCARDIOGRAM COMPLETE     Rib pain on left side       Relevant Orders   DG Ribs Unilateral Left (Completed)     Hyperlipidemia, unspecified hyperlipidemia type       Relevant Orders   Lipid panel     Fatigue, unspecified type       Relevant Orders   CBC with Differential/Platelet   Comprehensive metabolic panel with GFR   TSH   IBC + Ferritin   B12 and Folate Panel        Assessment & Plan Syncopal episodes Episodes approximately every six months with recent fall and potential rib injury. Postprandial symptoms suggest possible cardiac, neurologic, or  glycemic causes. Already established with neurology, but has not seen them for this recently. She will follow-up with them. Asymptomatic today.  Normal heart rate/rhythm on exam.  - Order 2-week heart monitor to assess  - Refer to cardiology for further evaluation. - Order echocardiogram. - Order labs today - Libre sample provided to monitor for glucose changes - Decrease in BP with increased HR from sitting to standing; ensure adequate hydration, could try compression socks.  Left rib pain after fall Left rib pain post-fall during syncope episode, possible fracture. - Order rib X-ray to assess for fracture.  Excessive burping Excessive burping without heartburn or reflux. - Recommend trial of omeprazole once daily for three weeks, then taper to every other day for three weeks, and every three days for three weeks.  History of anemia, iron deficiency Previously treated iron deficiency anemia. Current fatigue may indicate recurrence. - Order labs + iron studies to assess current status.        No orders of the defined types were placed in this encounter.   Return for - pending results or sooner if needed; physical in 3-6 months .  Waddell KATHEE Mon, NP

## 2024-07-28 NOTE — Progress Notes (Unsigned)
 EP to read.

## 2024-08-11 ENCOUNTER — Encounter: Payer: Self-pay | Admitting: Family Medicine

## 2024-08-27 ENCOUNTER — Ambulatory Visit (HOSPITAL_COMMUNITY)
Admission: RE | Admit: 2024-08-27 | Discharge: 2024-08-27 | Disposition: A | Discharge status: Home / Self Care | Source: Ambulatory Visit | Attending: Vascular Surgery | Admitting: Vascular Surgery

## 2024-08-27 DIAGNOSIS — R55 Syncope and collapse: Secondary | ICD-10-CM | POA: Insufficient documentation

## 2024-08-27 DIAGNOSIS — Z87898 Personal history of other specified conditions: Secondary | ICD-10-CM | POA: Insufficient documentation

## 2024-08-27 DIAGNOSIS — R42 Dizziness and giddiness: Secondary | ICD-10-CM | POA: Diagnosis not present

## 2024-08-27 DIAGNOSIS — Z87891 Personal history of nicotine dependence: Secondary | ICD-10-CM | POA: Diagnosis not present

## 2024-08-27 DIAGNOSIS — Z853 Personal history of malignant neoplasm of breast: Secondary | ICD-10-CM | POA: Insufficient documentation

## 2024-08-27 LAB — ECHOCARDIOGRAM COMPLETE
Area-P 1/2: 2.66 cm2
S' Lateral: 2.28 cm

## 2024-09-05 DIAGNOSIS — Z87898 Personal history of other specified conditions: Secondary | ICD-10-CM | POA: Diagnosis not present

## 2024-09-06 ENCOUNTER — Encounter: Payer: Self-pay | Admitting: Family Medicine

## 2024-09-14 NOTE — Telephone Encounter (Signed)
 Looks like referral was sent last November. Please advise on new referral.

## 2024-09-21 ENCOUNTER — Ambulatory Visit (INDEPENDENT_AMBULATORY_CARE_PROVIDER_SITE_OTHER): Admitting: Medical

## 2024-09-21 ENCOUNTER — Ambulatory Visit: Payer: Self-pay

## 2024-09-21 VITALS — BP 122/64 | HR 78 | Temp 98.2°F | Resp 17 | Ht 62.0 in | Wt 157.4 lb

## 2024-09-21 DIAGNOSIS — J40 Bronchitis, not specified as acute or chronic: Secondary | ICD-10-CM | POA: Diagnosis not present

## 2024-09-21 DIAGNOSIS — R062 Wheezing: Secondary | ICD-10-CM | POA: Diagnosis not present

## 2024-09-21 DIAGNOSIS — J3489 Other specified disorders of nose and nasal sinuses: Secondary | ICD-10-CM

## 2024-09-21 MED ORDER — ALBUTEROL SULFATE HFA 108 (90 BASE) MCG/ACT IN AERS: 2.0000 | INHALATION_SPRAY | Freq: Four times a day (QID) | RESPIRATORY_TRACT | 0 refills | Status: AC | PRN

## 2024-09-21 MED ORDER — FLUTICASONE PROPIONATE 50 MCG/ACT NA SUSP: 2.0000 | Freq: Every day | NASAL | 1 refills | Status: AC

## 2024-09-21 MED ORDER — BENZONATATE 100 MG PO CAPS: 100.0000 mg | ORAL_CAPSULE | Freq: Three times a day (TID) | ORAL | 0 refills | Status: AC | PRN

## 2024-09-21 MED ORDER — AZITHROMYCIN 250 MG PO TABS: ORAL_TABLET | ORAL | 0 refills | Status: AC

## 2024-09-21 NOTE — Telephone Encounter (Signed)
 Appt scheduled

## 2024-09-21 NOTE — Progress Notes (Signed)
 Subjective:    Patient ID: Debra Norton, female    DOB: 1956-05-02, 68 y.o.   MRN: [id number]  HPI  Debra Norton is a 68 year old female with recurrent bronchitis who presents with symptoms of a cold progressing towards bronchitis.  Her symptoms began approximately seven days ago after catching a cold from her sister. Initially, she experienced a sore throat, which has mostly resolved, but she continues to have a stuffy head and clogged ears.  Her cough has improved but remains present, and she describes her chest as feeling 'wheezy' and congested. The wheezing has been occurring frequently and has been present for the past two to three days. Her cough is mostly dry, although she occasionally coughs up mucus.  In the past, when her colds progress to bronchitis, she typically requires an inhaler and a course of azithromycin  (Z-Pak). Currently, she notes mild sinus tenderness, which has improved since the onset of her symptoms.  She has remote history of smoking.      Review of Systems  Constitutional:  Negative for chills and fatigue.  HENT:  Positive for congestion and sore throat. Negative for ear pain.   Respiratory:  Positive for cough and wheezing. Negative for choking and shortness of breath.   Cardiovascular:  Negative for chest pain and palpitations.  Gastrointestinal:  Negative for abdominal pain.  Genitourinary:  Negative for dysuria.  Musculoskeletal:  Negative for back pain.  Skin:  Negative for rash.  Neurological:  Negative for dizziness, weakness and numbness.  Hematological:  Negative for adenopathy.  Psychiatric/Behavioral:  Negative for confusion. The patient is not nervous/anxious.     Past Medical History:  Diagnosis Date   Allergy    Anxiety    Arthritis    Cancer (HCC)    Cervical dysplasia    Cholelithiasis    14mm stone seen on ultrasound in 2009   Colon polyp    Complication of anesthesia    pain was not controlled in PACU- had  to be admitted   Depression    Diverticulitis    Migraine headache    years ago   Sleep apnea    Vertigo      Social History   Socioeconomic History   Marital status: Widowed    Spouse name: Not on file   Number of children: 0   Years of education: Not on file   Highest education level: Associate degree: academic program  Occupational History   Occupation: [employer], 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  Tobacco Use   Smoking status: Former    Current packs/day: 0.00    Types: Cigarettes    Start date: 34    Quit date: 05/16/1977    Years since quitting: 47.3   Smokeless tobacco: Never   Tobacco comments:    quit in high School  Vaping Use   Vaping status: Never Used  Substance and Sexual Activity   Alcohol use: Yes    Alcohol/week: 2.0 standard drinks of alcohol    Types: 2 Standard drinks or equivalent per week   Drug use: Not Currently   Sexual activity: Yes    Birth control/protection: None    Comment: INTERCOURSE AGE 60, SEXUAL PARTNERS  MORE THAN 5  Other Topics Concern   Not on file  Social History Narrative   Lives with boy friend   G4 P0    miscarriage x 3    ectopic x 1    Social Drivers of Corporate investment banker Strain: Low  Risk  (07/21/2024)   Overall Financial Resource Strain (CARDIA)    Difficulty of Paying Living Expenses: Not hard at all  Food Insecurity: No Food Insecurity (07/21/2024)   Hunger Vital Sign    Worried About Running Out of Food in the Last Year: Never true    Ran Out of Food in the Last Year: Never true  Transportation Needs: No Transportation Needs (07/21/2024)   PRAPARE - Administrator, Civil Service (Medical): No    Lack of Transportation (Non-Medical): No  Physical Activity: Insufficiently Active (07/21/2024)   Exercise Vital Sign    Days of Exercise per Week: 3 days    Minutes of Exercise per Session: 20 min  Stress: No Stress Concern Present (07/21/2024)   [employer] of Occupational Health - Occupational Stress Questionnaire     Feeling of Stress: Only a little  Social Connections: Moderately Isolated (07/21/2024)   Social Connection and Isolation Panel    Frequency of Communication with Friends and Family: More than three times a week    Frequency of Social Gatherings with Friends and Family: More than three times a week    Attends Religious Services: 1 to 4 times per year    Active Member of [employer] or Organizations: No    Attends Banker Meetings: Not on file    Marital Status: Widowed  Intimate Partner Violence: Not At Risk (11/11/2023)   Humiliation, Afraid, Rape, and Kick questionnaire    Fear of Current or Ex-Partner: No    Emotionally Abused: No    Physically Abused: No    Sexually Abused: No    Past Surgical History:  Procedure Laterality Date   BREAST LUMPECTOMY WITH RADIOACTIVE SEED LOCALIZATION Right 01/30/2023   Procedure: RIGHT BREAST LUMPECTOMY WITH RADIOACTIVE SEED LOCALIZATION;  Surgeon: Aron Shoulders, MD;  Location: [facility] OR;  Service: General;  Laterality: Right;   BREAST SURGERY     CHOLECYSTECTOMY     COLPOSCOPY     ECTOPIC PREGNANCY SURGERY     EXTERNAL EAR SURGERY Left    EYE SURGERY     GYNECOLOGIC CRYOSURGERY      Family History  Problem Relation Age of Onset   Colon cancer Mother 85 - 59   Anxiety disorder Mother    Asthma Mother    Cancer Mother    CAD Father        68   Diabetes Sister    Early death Sister    Ovarian cancer Cousin        maternal first cousin   Breast cancer Neg Hx    Esophageal cancer Neg Hx    Pancreatic cancer Neg Hx    Rectal cancer Neg Hx    Stomach cancer Neg Hx     Allergies  Allergen Reactions   Diphenhydramine Hcl Other (See Comments)    VERY WIRED/palpitations  Benadryl   Penicillins Hives   Clindamycin/Lincomycin Rash    Rash shortly after she finished a round of abx , see OV note 05-10-15   Flagyl  [Metronidazole ] Nausea And Vomiting   Hydrocodone      Insomnia, itching (mild)   Tetanus Toxoid Other (See Comments)     REALLY BAD SWELLING at the injection site    Current Outpatient Medications on File Prior to Visit  Medication Sig Dispense Refill   ALPRAZolam  (XANAX ) 0.25 MG tablet Take 1 tablet (0.25 mg total) by mouth at bedtime as needed for anxiety. 30 tablet 0   tamoxifen  (NOLVADEX ) 10 MG tablet  TAKE 0.5 TABLET BY MOUTH DAILY 90 tablet 1   Current Facility-Administered Medications on File Prior to Visit  Medication Dose Route Frequency Provider Last Rate Last Admin   0.9 %  sodium chloride  infusion  500 mL Intravenous Once Armbruster, Elspeth SQUIBB, MD        BP 122/64   Pulse 78   Temp 98.2 F (36.8 C) (Oral)   Resp 17   Ht 5' 2 (1.575 m)   Wt 157 lb 6.4 oz (71.4 kg)   SpO2 96%   BMI 28.79 kg/m         Objective:   Physical Exam  General- No acute distress. Pleasant patient. Neck- Full range of motion, no jvd Lungs- Clear, even and unlabored. Heart- regular rate and rhythm. Neurologic- CNII- XII grossly intact.  Lower ext- no pedal edema, negative homans signs. Calfs symetric Heent- faint maxillary sinus pressure.      Assessment & Plan:   Patient Instructions  Bronchitis(early) following recent uri Bronchitis-like symptoms and persistent wheezing. Sinus tenderness improving. - Prescribed benzonatate  for cough relief. - Prescribed Flonase  for nasal congestion. - Prescribed albuterol  inhaler for wheezing. - Prescribed azithromycin  (Z-Pak) for worsening symptoms such as increased sinus pressure, productive cough, or chest congestion. - Advised monitoring symptoms for three days and starting azithromycin  if symptoms worsen or persis  Follow up  7-10 days or sooner if needed   [employer], PA-C

## 2024-09-21 NOTE — Patient Instructions (Signed)
 Bronchitis(early) following recent uri Bronchitis-like symptoms and persistent wheezing. Sinus tenderness improving. - Prescribed benzonatate  for cough relief. - Prescribed Flonase  for nasal congestion. - Prescribed albuterol  inhaler for wheezing. - Prescribed azithromycin  (Z-Pak) for worsening symptoms such as increased sinus pressure, productive cough, or chest congestion. - Advised monitoring symptoms for three days and starting azithromycin  if symptoms worsen or persis  Follow up  7-10 days or sooner if needed

## 2024-09-21 NOTE — Telephone Encounter (Signed)
 FYI Only or Action Required?: Action required by provider: request for appointment.  Patient was last seen in primary care on 07/28/2024 by Almarie Waddell NOVAK, NP.  Called Nurse Triage reporting Cough.  Symptoms began several days ago.  Interventions attempted: Nothing.  Symptoms are: unchanged.  Triage Disposition: See Physician Within 24 Hours  Patient/caregiver understands and will follow disposition?: Yes  Copied from CRM #[id number]. Topic: Clinical - Red Word Triage >> Sep 21, 2024  9:53 AM Hamdi H wrote: [facility] that prompted transfer to Nurse Triage: Cold, couch, tired, achy and now it's moving down to chest. She usually gets bronchitis when it moves to her chest and she would like to prevent that if possible. Reason for Disposition  SEVERE coughing spells (e.g., whooping sound after coughing, vomiting after coughing)  Answer Assessment - Initial Assessment Questions No available appts with PCP, scheduled alternative provider 09/21/24. Advised call back if symptoms worsen.  1. ONSET: When did the cough begin?      2 days 2. SEVERITY: How bad is the cough today?      moderate 3. SPUTUM: Describe the color of your sputum (e.g., none, dry cough; clear, white, yellow, green)     clear 4. HEMOPTYSIS: Are you coughing up any blood? If Yes, ask: How much? (e.g., flecks, streaks, tablespoons, etc.)     no 5. DIFFICULTY BREATHING: Are you having difficulty breathing? If Yes, ask: How bad is it? (e.g., mild, moderate, severe)      no 6. FEVER: Do you have a fever? If Yes, ask: What is your temperature, how was it measured, and when did it start?     no 7. CARDIAC HISTORY: Do you have any history of heart disease? (e.g., heart attack, congestive heart failure)      no 8. LUNG HISTORY: Do you have any history of lung disease?  (e.g., pulmonary embolus, asthma, emphysema)     No; hx bronchitis; feels like its starting again 9. PE RISK FACTORS: Do you have a history  of blood clots? (or: recent major surgery, recent prolonged travel, bedridden)     no 10. OTHER SYMPTOMS: Do you have any other symptoms? (e.g., runny nose, wheezing, chest pain)       Little wheezing, runny nose  12. TRAVEL: Have you traveled out of the country in the last month? (e.g., travel history, exposures)       No Been exposed to sick family  Protocols used: Cough - Acute Productive-A-AH

## 2024-10-14 ENCOUNTER — Other Ambulatory Visit: Payer: Self-pay | Admitting: Medical Genetics

## 2024-10-14 DIAGNOSIS — Z006 Encounter for examination for normal comparison and control in clinical research program: Secondary | ICD-10-CM

## 2024-10-18 ENCOUNTER — Ambulatory Visit: Attending: Cardiology | Admitting: Cardiology

## 2024-10-18 VITALS — BP 129/83 | HR 95 | Ht 62.0 in | Wt 159.8 lb

## 2024-10-18 DIAGNOSIS — R55 Syncope and collapse: Secondary | ICD-10-CM | POA: Insufficient documentation

## 2024-10-18 DIAGNOSIS — E782 Mixed hyperlipidemia: Secondary | ICD-10-CM | POA: Diagnosis not present

## 2024-10-18 NOTE — Patient Instructions (Signed)
  Testing/Procedures: Cardiac calcium  score  CT scanning for a cardiac calcium  score (CAT scanning), is a noninvasive, special x-ray that produces cross-sectional images of the body using x-rays and a computer. CT scans help physicians diagnose and treat medical conditions. For some CT exams, a contrast material is used to enhance visibility in the area of the body being studied. CT scans provide greater clarity and reveal more details than regular x-ray exams.  Your physician has requested that you have a coronary calcium  score performed. This is not covered by insurance and will be an out-of-pocket cost of approximately $99.    Follow-Up: At [facility], you and your health needs are our priority.  As part of our continuing mission to provide you with exceptional heart care, our providers are all part of one team.  This team includes your primary Cardiologist (physician) and Advanced Practice Providers or APPs (Physician Assistants and Nurse Practitioners) who all work together to provide you with the care you need, when you need it.  Your next appointment:   3 month(s)  Provider:   One of our Advanced Practice Providers (APPs): Morse Clause, PA-C  Lamarr Satterfield, NP Miriam Shams, NP  Olivia Pavy, PA-C Josefa Beauvais, NP  Leontine Salen, PA-C Orren Fabry, PA-C  Hao Meng, PA-C Ernest Dick, NP  Damien Braver, NP Jon Hails, PA-C  Waddell Donath, PA-C    Dayna Dunn, PA-C  Scott Weaver, PA-C Lum Louis, NP Katlyn West, NP Callie Goodrich, PA-C  Xika Zhao, NP Sheng Haley, PA-C    Kathleen Johnson, PA-C     We recommend signing up for the patient portal called MyChart.  Sign up information is provided on this After Visit Summary.  MyChart is used to connect with patients for Virtual Visits (Telemedicine).  Patients are able to view lab/test results, encounter notes, upcoming appointments, etc.  Non-urgent messages can be sent to your provider as well.   To learn more  about what you can do with MyChart, go to [email or web address].   Other Instructions Counter pressure maneuvers

## 2024-10-18 NOTE — Progress Notes (Signed)
 Cardiology Office Note:  .   Date:  10/18/2024  ID:  EYVA CALIFANO, DOB [birth year], MRN [id number] PCP: Almarie Waddell NOVAK, NP  [facility] Providers Cardiologist:  Newman Lawrence, MD PCP: Almarie Waddell NOVAK, NP  Chief Complaint  Patient presents with   Syncope     MALAI LADY is a 68 y.o. female with syncope  Discussed the use of AI scribe software for clinical note transcription with the patient, who gave verbal consent to proceed.  History of Present Illness ASHLYE OVIEDO is a 68 year old female who presents with recurrent episodes of syncope.  She experiences syncope episodes that have increased in frequency over the past year, occurring three times. Prodromal symptoms include lightheadedness, sweating, and nausea, followed by a brief loss of consciousness lasting about 30 seconds. Episodes occur without identifiable triggers and can happen while standing or sitting. Historically, similar episodes occurred during pregnancies.  She consumes about two glasses of water per day and drinks two to three cups of tea or coffee daily. No chest pain, shortness of breath, or heart palpitations are present prior to these episodes. One episode occurred while fishing, indicating some exertion.  She drinks alcohol two to three days a week and maintains a moderately active lifestyle, walking two to three miles a day at work, but does not engage in regular exercise outside of work.      Vitals:   10/18/24 1530  BP: 129/83  Pulse: 95  SpO2: 97%    Orthostatic VS for the past 72 hrs (Last 3 readings):  Orthostatic BP Patient Position BP Location Cuff Size Orthostatic Pulse  10/18/24 1545 129/78 Standing Left Arm Normal 104  10/18/24 1544 130/74 Sitting Left Arm Normal 91  10/18/24 1543 126/80 Supine Left Arm Normal 89     Review of Systems  Cardiovascular:  Positive for syncope. Negative for chest pain, dyspnea on exertion, leg swelling and palpitations.         Studies Reviewed: SABRA        EKG 10/18/2024: Normal sinus rhythm Possible Left atrial enlargement When compared with ECG of 17-Oct-2021 15:43,  rate is faster    Echocardiogram 08/2024:  1. Left ventricular ejection fraction, by estimation, is 60 to 65%. Left  ventricular ejection fraction by 3D volume is 60 %. The left ventricle has  normal function. The left ventricle has no regional wall motion  abnormalities. Left ventricular diastolic   parameters are consistent with Grade I diastolic dysfunction (impaired  relaxation).   2. Right ventricular systolic function is normal. The right ventricular  size is normal. Tricuspid regurgitation signal is inadequate for assessing  PA pressure.   3. The mitral valve is grossly normal. Trivial mitral valve  regurgitation. No evidence of mitral stenosis.   4. The aortic valve is tricuspid. Aortic valve regurgitation is not  visualized. No aortic stenosis is present.   5. The inferior vena cava is normal in size with greater than 50%  respiratory variability, suggesting right atrial pressure of 3 mmHg.   Zio monitor 07/2024:  HR 49 - 203, average 82 bpm. 6 nonsustained SVT (longest 7.7 seconds). No atrial fibrillation detected. Rare supraventricular ectopy. Rare ventricular ectopy. No sustained arrhythmias. Symptom trigger episodes correspond to sinus rhythm.  CT cardiac scoring 2021: Calcium  score 0  Labs 07/2024: Chol 224, TG 120, HDL 61, LDL 138 Hb 13.1 Cr 0.81 TSH 4.3  10/2023: HbA1C 5.6%   Physical Exam Vitals and nursing note reviewed.  Constitutional:  General: She is not in acute distress. Neck:     Vascular: No JVD.  Cardiovascular:     Rate and Rhythm: Normal rate and regular rhythm.     Heart sounds: Normal heart sounds. No murmur heard. Pulmonary:     Effort: Pulmonary effort is normal.     Breath sounds: Normal breath sounds. No wheezing or rales.  Musculoskeletal:     Right lower leg: No  edema.     Left lower leg: No edema.      VISIT DIAGNOSES:   ICD-10-CM   1. Syncope and collapse  R55 EKG 12-Lead    2. Mixed hyperlipidemia  E78.2 CT CARDIAC SCORING (SELF PAY ONLY)       Tamyka Bezio Forte-Leroux is a 68 y.o. female with syncope Assessment and Plan Assessment & Plan Recurrent syncope (vasovagal/orthostatic): Recurrent syncope likely vasovagal or orthostatic. Echocardiogram normal, monitor unremarkable for syncope cause. No orthostatic hypotension or POTS noted. - Increase fluid intake significantly, aim for three to five times current intake. - Consider wearing compression stockings. - Perform counterpressure maneuvers during warning symptoms. - Keep a diary of symptoms to identify potential triggers. - Follow up in three months to review symptom diary and assess for triggers.  Paroxysmal supraventricular tachycardia (nonsustained): Non-sustained SVT episodes noted, asymptomatic and unlikely cause of syncope. Low suspicion for prolonged SVT causing syncope. Discussed implantable loop recorder, low suspicion for syncope cause detection. - Consider implantable loop recorder if syncope episodes persist and are unexplained by current management.  Mixed hyperlipidemia: Cholesterol levels remain high. Previous calcium  score scan in 2021 showed zero calcium  deposition. Discussed repeating scan to assess for new deposition. - Ordered repeat calcium  score scan to assess for calcium  deposition.      F/u in 3 months  Signed, Newman JINNY Lawrence, MD

## 2024-10-22 ENCOUNTER — Ambulatory Visit: Payer: Self-pay | Admitting: Cardiology

## 2024-10-22 ENCOUNTER — Ambulatory Visit (HOSPITAL_COMMUNITY)
Admission: RE | Admit: 2024-10-22 | Discharge: 2024-10-22 | Disposition: A | Discharge status: Home / Self Care | Payer: Self-pay | Source: Ambulatory Visit | Attending: Internal Medicine | Admitting: Internal Medicine

## 2024-10-22 DIAGNOSIS — E782 Mixed hyperlipidemia: Secondary | ICD-10-CM

## 2024-10-29 ENCOUNTER — Ambulatory Visit: Admitting: Cardiology

## 2024-11-01 ENCOUNTER — Ambulatory Visit: Payer: Medicare HMO | Admitting: Hematology and Oncology

## 2024-11-08 LAB — GENECONNECT MOLECULAR SCREEN: Genetic Analysis Overall Interpretation: NEGATIVE

## 2024-11-16 ENCOUNTER — Ambulatory Visit: Payer: Medicare HMO

## 2024-11-16 VITALS — Ht 62.0 in | Wt 159.0 lb

## 2024-11-16 DIAGNOSIS — Z0000 Encounter for general adult medical examination without abnormal findings: Secondary | ICD-10-CM

## 2024-11-16 NOTE — Progress Notes (Signed)
 Chief Complaint  Patient presents with   Medicare Wellness     Subjective:   Debra Norton is a 68 y.o. female who presents for a Medicare Annual Wellness Visit.  Visit info / Clinical Intake: Medicare Wellness Visit Type:: Subsequent Annual Wellness Visit Persons participating in visit and providing information:: patient Medicare Wellness Visit Mode:: Video Since this visit was completed virtually, some vitals may be partially provided or unavailable. Missing vitals are due to the limitations of the virtual format.: Documented vitals are patient reported If Telephone or Video please confirm:: I connected with patient using audio/video enable telemedicine. I verified patient identity with two identifiers, discussed telehealth limitations, and patient agreed to proceed. Patient Location:: Home Provider Location:: Office Interpreter Needed?: No Pre-visit prep was completed: no AWV questionnaire completed by patient prior to visit?: no Living arrangements:: (!) lives alone Patient's Overall Health Status Rating: very good Typical amount of pain: none Does pain affect daily life?: no Are you currently prescribed opioids?: no  Dietary Habits and Nutritional Risks How many meals a day?: 3 Eats fruit and vegetables daily?: yes Most meals are obtained by: preparing own meals In the last 2 weeks, have you had any of the following?: none Diabetic:: no  Functional Status Activities of Daily Living (to include ambulation/medication): Independent Ambulation: Independent with device- listed below Home Assistive Devices/Equipment: Eyeglasses Medication Administration: Independent Home Management (perform basic housework or laundry): Independent Manage your own finances?: yes Primary transportation is: driving Concerns about vision?: no *vision screening is required for WTM* Concerns about hearing?: no  Fall Screening Falls in the past year?: 1 Number of falls in past year:  0 Was there an injury with Fall?: 0 Fall Risk Category Calculator: 1 Patient Fall Risk Level: Low Fall Risk  Fall Risk Patient at Risk for Falls Due to: Mental status change Fall risk Follow up: Falls prevention discussed  Home and Transportation Safety: All rugs have non-skid backing?: yes All stairs or steps have railings?: yes Grab bars in the bathtub or shower?: (!) no Have non-skid surface in bathtub or shower?: (!) no Good home lighting?: yes Regular seat belt use?: yes Hospital stays in the last year:: no  Cognitive Assessment Difficulty concentrating, remembering, or making decisions? : no Will 6CIT or Mini Cog be Completed: no 6CIT or Mini Cog Declined: patient alert, oriented, able to answer questions appropriately and recall recent events  Advance Directives (For Healthcare) Does Patient Have a Medical Advance Directive?: Yes Does patient want to make changes to medical advance directive?: No - Patient declined Type of Advance Directive: Healthcare Power of [place]; Living will Copy of Healthcare Power of Attorney in Chart?: No - copy requested Copy of Living Will in Chart?: No - copy requested Would patient like information on creating a medical advance directive?: No - Patient declined  Reviewed/Updated  Reviewed/Updated: Reviewed All (Medical, Surgical, Family, Medications, Allergies, Care Teams, Patient Goals)    Allergies (verified) Diphenhydramine hcl, Penicillins, Clindamycin/lincomycin, Flagyl  [metronidazole ], Hydrocodone , and Tetanus toxoid   Current Medications (verified) Outpatient Encounter Medications as of 11/16/2024  Medication Sig   albuterol  (VENTOLIN  HFA) 108 (90 Base) MCG/ACT inhaler Inhale 2 puffs into the lungs every 6 (six) hours as needed. (Patient not taking: Reported on 10/18/2024)   ALPRAZolam  (XANAX ) 0.25 MG tablet Take 1 tablet (0.25 mg total) by mouth at bedtime as needed for anxiety.   benzonatate  (TESSALON ) 100 MG capsule Take 1  capsule (100 mg total) by mouth 3 (three) times daily as needed  for cough. (Patient not taking: Reported on 10/18/2024)   fluticasone  (FLONASE ) 50 MCG/ACT nasal spray Place 2 sprays into both nostrils daily. (Patient not taking: Reported on 10/18/2024)   tamoxifen  (NOLVADEX ) 10 MG tablet TAKE 0.5 TABLET BY MOUTH DAILY   Facility-Administered Encounter Medications as of 11/16/2024  Medication   0.9 %  sodium chloride  infusion    History: Past Medical History:  Diagnosis Date   Allergy    Anxiety    Arthritis    Cancer (HCC)    Cervical dysplasia    Cholelithiasis    14mm stone seen on ultrasound in 2009   Colon polyp    Complication of anesthesia    pain was not controlled in PACU- had to be admitted   Depression    Diverticulitis    Migraine headache    years ago   Mixed hyperlipidemia 10/18/2024   Sleep apnea    Vertigo    Past Surgical History:  Procedure Laterality Date   BREAST LUMPECTOMY WITH RADIOACTIVE SEED LOCALIZATION Right 01/30/2023   Procedure: RIGHT BREAST LUMPECTOMY WITH RADIOACTIVE SEED LOCALIZATION;  Surgeon: Aron Shoulders, MD;  Location: [facility] OR;  Service: General;  Laterality: Right;   BREAST SURGERY     CHOLECYSTECTOMY     COLPOSCOPY     ECTOPIC PREGNANCY SURGERY     EXTERNAL EAR SURGERY Left    EYE SURGERY     GYNECOLOGIC CRYOSURGERY     Family History  Problem Relation Age of Onset   Colon cancer Mother 86 - 1   Anxiety disorder Mother    Asthma Mother    Cancer Mother    CAD Father        35   Diabetes Sister    Early death Sister    Ovarian cancer Cousin        maternal first cousin   Breast cancer Neg Hx    Esophageal cancer Neg Hx    Pancreatic cancer Neg Hx    Rectal cancer Neg Hx    Stomach cancer Neg Hx    Social History   Occupational History   Occupation: ESTATE AGENT, 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  Tobacco Use   Smoking status: Former    Current packs/day: 0.00    Types: Cigarettes    Start date: 14    Quit date: 05/16/1977    Years since quitting:  47.5   Smokeless tobacco: Never   Tobacco comments:    quit in high School  Vaping Use   Vaping status: Never Used  Substance and Sexual Activity   Alcohol use: Yes    Alcohol/week: 2.0 standard drinks of alcohol    Types: 2 Standard drinks or equivalent per week   Drug use: Not Currently   Sexual activity: Yes    Birth control/protection: None    Comment: INTERCOURSE AGE 57, SEXUAL PARTNERS  MORE THAN 5   Tobacco Counseling Counseling given: No Tobacco comments: quit in high School  SDOH Screenings   Food Insecurity: No Food Insecurity (11/16/2024)  Housing: Unknown (11/16/2024)  Transportation Needs: No Transportation Needs (11/16/2024)  Utilities: Not At Risk (11/16/2024)  Alcohol Screen: Low Risk  (07/21/2024)  Depression (PHQ2-9): Low Risk  (11/16/2024)  Recent Concern: Depression (PHQ2-9) - Medium Risk (09/21/2024)  Financial Resource Strain: Low Risk  (07/21/2024)  Physical Activity: Sufficiently Active (11/16/2024)  Social Connections: Moderately Isolated (11/16/2024)  Stress: No Stress Concern Present (11/16/2024)  Tobacco Use: Medium Risk (11/16/2024)  Health Literacy: Adequate Health Literacy (11/16/2024)   See flowsheets for full  screening details  Depression Screen PHQ 2 & 9 Depression Scale- Over the past 2 weeks, how often have you been bothered by any of the following problems? Little interest or pleasure in doing things: 0 Feeling down, depressed, or hopeless (PHQ Adolescent also includes...irritable): 0 PHQ-2 Total Score: 0 Trouble falling or staying asleep, or sleeping too much: 0 Feeling tired or having little energy: 0 Poor appetite or overeating (PHQ Adolescent also includes...weight loss): 0 Feeling bad about yourself - or that you are a failure or have let yourself or your family down: 0 Trouble concentrating on things, such as reading the newspaper or watching television (PHQ Adolescent also includes...like school work): 0 Moving or speaking so slowly that  other people could have noticed. Or the opposite - being so fidgety or restless that you have been moving around a lot more than usual: 0 Thoughts that you would be better off dead, or of hurting yourself in some way: 0 PHQ-9 Total Score: 0 If you checked off any problems, how difficult have these problems made it for you to do your work, take care of things at home, or get along with other people?: Not difficult at all     Goals Addressed               This Visit's Progress     Increase physical activity (pt-stated)        Remain active.             Objective:    Today's Vitals   11/16/24 1053  Weight: 159 lb (72.1 kg)  Height: 5' 2 (1.575 m)   Body mass index is 29.08 kg/m.  Hearing/Vision screen Hearing Screening - Comments:: Denies hearing difficulties   Vision Screening - Comments:: Wears rx glasses - up to date with routine eye exams with  Dr Juvenal Immunizations and Health Maintenance Health Maintenance  Topic Date Due   COVID-19 Vaccine (6 - 2025-26 season) 08/16/2024   Zoster Vaccines- Shingrix (1 of 2) 12/22/2024 (Originally 09/30/1975)   Influenza Vaccine  03/15/2025 (Originally 07/16/2024)   Mammogram  05/17/2025   Medicare Annual Wellness (AWV)  11/16/2025   Bone Density Scan  02/20/2026   Colonoscopy  01/29/2029   Pneumococcal Vaccine: 50+ Years  Completed   Hepatitis C Screening  Completed   Meningococcal B Vaccine  Aged Out   DTaP/Tdap/Td  Discontinued        Assessment/Plan:  This is a routine wellness examination for [place].  Patient Care Team: Almarie Waddell NOVAK, NP as PCP - General (Family Medicine) Ebbie Cough, MD as Consulting Physician (General Surgery) Aron Shoulders, MD as Consulting Physician (General Surgery) Odean Potts, MD as Consulting Physician (Hematology and Oncology) Dewey Rush, MD as Consulting Physician (Radiation Oncology) Fleeta Zerita DASEN, MD as Consulting Physician (Ophthalmology)  I have personally reviewed and  noted the following in the patient's chart:   Medical and social history Use of alcohol, tobacco or illicit drugs  Current medications and supplements including opioid prescriptions. Functional ability and status Nutritional status Physical activity Advanced directives List of other physicians Hospitalizations, surgeries, and ER visits in previous 12 months Vitals Screenings to include cognitive, depression, and falls Referrals and appointments  No orders of the defined types were placed in this encounter.  In addition, I have reviewed and discussed with patient certain preventive protocols, quality metrics, and best practice recommendations. A written personalized care plan for preventive services as well as general preventive health recommendations were provided to patient.  Rojelio LELON Blush, LPN   [id number]   Return in 1 year on 11/22/25  After Visit Summary: (MyChart) Due to this being a telephonic visit, the after visit summary with patients personalized plan was offered to patient via MyChart   Nurse Notes: None

## 2024-11-16 NOTE — Patient Instructions (Addendum)
 [name],  Thank you for taking the time for your Medicare Wellness Visit. I appreciate your continued commitment to your health goals. Please review the care plan we discussed, and feel free to reach out if I can assist you further.  Please note that Annual Wellness Visits do not include a physical exam. Some assessments may be limited, especially if the visit was conducted virtually. If needed, we may recommend an in-person follow-up with your provider.  Ongoing Care Seeing your primary care provider every 3 to 6 months helps us  monitor your health and provide consistent, personalized care.    Referrals If a referral was made during today's visit and you haven't received any updates within two weeks, please contact the referred provider directly to check on the status.  Recommended Screenings:  Health Maintenance  Topic Date Due   COVID-19 Vaccine (6 - 2025-26 season) 08/16/2024   Zoster (Shingles) Vaccine (1 of 2) 12/22/2024*   Flu Shot  03/15/2025*   Breast Cancer Screening  05/17/2025   Medicare Annual Wellness Visit  11/16/2025   Osteoporosis screening with Bone Density Scan  02/20/2026   Colon Cancer Screening  01/29/2029   Pneumococcal Vaccine for age over 7  Completed   Hepatitis C Screening  Completed   Meningitis B Vaccine  Aged Out   DTaP/Tdap/Td vaccine  Discontinued  *Topic was postponed. The date shown is not the original due date.       11/16/2024   10:55 AM  Advanced Directives  Does Patient Have a Medical Advance Directive? Yes  Type of Estate Agent of [place];Living will  Does patient want to make changes to medical advance directive? No - Patient declined  Copy of Healthcare Power of Attorney in Chart? No - copy requested    Vision: Annual vision screenings are recommended for early detection of glaucoma, cataracts, and diabetic retinopathy. These exams can also reveal signs of chronic conditions such as diabetes and high  blood pressure.  Dental: Annual dental screenings help detect early signs of oral cancer, gum disease, and other conditions linked to overall health, including heart disease and diabetes.  Please see the attached documents for additional preventive care recommendations.

## 2024-11-26 DIAGNOSIS — H524 Presbyopia: Secondary | ICD-10-CM | POA: Diagnosis not present

## 2024-11-26 DIAGNOSIS — H52223 Regular astigmatism, bilateral: Secondary | ICD-10-CM | POA: Diagnosis not present

## 2025-01-06 ENCOUNTER — Encounter: Payer: Self-pay | Admitting: Adult Health

## 2025-02-04 ENCOUNTER — Ambulatory Visit: Admitting: Cardiology

## 2025-02-14 ENCOUNTER — Ambulatory Visit: Admitting: Neurology

## 2025-06-07 ENCOUNTER — Inpatient Hospital Stay: Admitting: Hematology and Oncology

## 2025-11-22 ENCOUNTER — Ambulatory Visit
